# Patient Record
Sex: Female | Born: 1980
Health system: Southern US, Community
[De-identification: ages and names within clinical notes are randomized; demographics above are authoritative.]

## PROBLEM LIST (undated history)

## (undated) DIAGNOSIS — Z8742 Personal history of other diseases of the female genital tract: Secondary | ICD-10-CM

## (undated) DIAGNOSIS — K8020 Calculus of gallbladder without cholecystitis without obstruction: Secondary | ICD-10-CM

## (undated) DIAGNOSIS — F32A Depression, unspecified: Secondary | ICD-10-CM

## (undated) DIAGNOSIS — D649 Anemia, unspecified: Secondary | ICD-10-CM

## (undated) DIAGNOSIS — E559 Vitamin D deficiency, unspecified: Secondary | ICD-10-CM

## (undated) DIAGNOSIS — Z8619 Personal history of other infectious and parasitic diseases: Secondary | ICD-10-CM

## (undated) DIAGNOSIS — E039 Hypothyroidism, unspecified: Secondary | ICD-10-CM

## (undated) DIAGNOSIS — F329 Major depressive disorder, single episode, unspecified: Secondary | ICD-10-CM

## (undated) DIAGNOSIS — Z9889 Other specified postprocedural states: Secondary | ICD-10-CM

## (undated) DIAGNOSIS — L309 Dermatitis, unspecified: Secondary | ICD-10-CM

## (undated) DIAGNOSIS — K900 Celiac disease: Secondary | ICD-10-CM

## (undated) DIAGNOSIS — F419 Anxiety disorder, unspecified: Secondary | ICD-10-CM

## (undated) DIAGNOSIS — E079 Disorder of thyroid, unspecified: Secondary | ICD-10-CM

## (undated) DIAGNOSIS — K219 Gastro-esophageal reflux disease without esophagitis: Secondary | ICD-10-CM

## (undated) DIAGNOSIS — R112 Nausea with vomiting, unspecified: Secondary | ICD-10-CM

## (undated) HISTORY — PX: NO PAST SURGERIES: SHX2092

## (undated) HISTORY — DX: Celiac disease: K90.0

## (undated) HISTORY — DX: Disorder of thyroid, unspecified: E07.9

## (undated) HISTORY — DX: Dermatitis, unspecified: L30.9

## (undated) HISTORY — DX: Anxiety disorder, unspecified: F41.9

## (undated) HISTORY — PX: WISDOM TOOTH EXTRACTION: SHX21

## (undated) HISTORY — DX: Personal history of other infectious and parasitic diseases: Z86.19

## (undated) HISTORY — DX: Anemia, unspecified: D64.9

## (undated) HISTORY — DX: Depression, unspecified: F32.A

## (undated) HISTORY — DX: Personal history of other diseases of the female genital tract: Z87.42

## (undated) HISTORY — DX: Major depressive disorder, single episode, unspecified: F32.9

## (undated) HISTORY — DX: Calculus of gallbladder without cholecystitis without obstruction: K80.20

## (undated) HISTORY — DX: Vitamin D deficiency, unspecified: E55.9

---

## 2014-08-05 ENCOUNTER — Ambulatory Visit (INDEPENDENT_AMBULATORY_CARE_PROVIDER_SITE_OTHER): Payer: No Typology Code available for payment source | Admitting: Family

## 2014-08-05 ENCOUNTER — Ambulatory Visit (INDEPENDENT_AMBULATORY_CARE_PROVIDER_SITE_OTHER): Payer: No Typology Code available for payment source | Admitting: *Deleted

## 2014-08-05 ENCOUNTER — Telehealth: Payer: Self-pay | Admitting: Family

## 2014-08-05 ENCOUNTER — Encounter: Payer: Self-pay | Admitting: Family

## 2014-08-05 VITALS — BP 100/70 | HR 73 | Temp 98.5°F | Resp 16 | Ht 66.5 in | Wt 195.4 lb

## 2014-08-05 DIAGNOSIS — F329 Major depressive disorder, single episode, unspecified: Secondary | ICD-10-CM | POA: Insufficient documentation

## 2014-08-05 DIAGNOSIS — F32A Depression, unspecified: Secondary | ICD-10-CM

## 2014-08-05 DIAGNOSIS — F419 Anxiety disorder, unspecified: Secondary | ICD-10-CM

## 2014-08-05 DIAGNOSIS — L309 Dermatitis, unspecified: Secondary | ICD-10-CM

## 2014-08-05 DIAGNOSIS — F418 Other specified anxiety disorders: Secondary | ICD-10-CM

## 2014-08-05 DIAGNOSIS — E039 Hypothyroidism, unspecified: Secondary | ICD-10-CM | POA: Insufficient documentation

## 2014-08-05 DIAGNOSIS — Z23 Encounter for immunization: Secondary | ICD-10-CM

## 2014-08-05 DIAGNOSIS — E038 Other specified hypothyroidism: Secondary | ICD-10-CM | POA: Diagnosis not present

## 2014-08-05 DIAGNOSIS — H01139 Eczematous dermatitis of unspecified eye, unspecified eyelid: Secondary | ICD-10-CM

## 2014-08-05 LAB — TSH: TSH: 0.04 u[IU]/mL — AB (ref 0.35–4.50)

## 2014-08-05 MED ORDER — CITALOPRAM HYDROBROMIDE 40 MG PO TABS: 40.0000 mg | ORAL_TABLET | Freq: Every day | ORAL | Status: DC

## 2014-08-05 MED ORDER — LEVOTHYROXINE SODIUM 150 MCG PO TABS: 150.0000 ug | ORAL_TABLET | Freq: Every day | ORAL | Status: DC

## 2014-08-05 MED ORDER — CLOTRIMAZOLE-BETAMETHASONE 1-0.05 % EX CREA: 1.0000 "application " | TOPICAL_CREAM | Freq: Two times a day (BID) | CUTANEOUS | Status: DC

## 2014-08-05 NOTE — Progress Notes (Signed)
Pre visit review using our clinic review tool, if applicable. No additional management support is needed unless otherwise documented below in the visit note. 

## 2014-08-05 NOTE — Patient Instructions (Signed)
Please complete lab work prior to leaving. Schedule a fasting physical at your convenience.

## 2014-08-05 NOTE — Telephone Encounter (Signed)
Lab work is showing that synthroid dose is too high. I would like her to decrease to 150 mcg repeat tsh in 6 weeks, dx hypothyroid.

## 2014-08-05 NOTE — Assessment & Plan Note (Signed)
Has had some weight gain, obtain follow up TSH, continue synthroid.

## 2014-08-05 NOTE — Assessment & Plan Note (Signed)
Continue citalopram, stable.

## 2014-08-05 NOTE — Progress Notes (Signed)
Subjective:    Patient ID: Jessica Villa, female    DOB: Jan 25, 1981, 33 y.o.   MRN: [id number]  HPI  Jessica Villa is a 33 yr old female who presents today to establish care.  1) Hypothyroid- maintained on synthroid. Reports that she has been on synthroid since 2001. Reports that she feels fatigued and has noted some weight gain.  Has been 6 months since her last TSH. Reports 30 pound weight gain in the last 18 months.    2) Depression/anxiety- on citalopram. She has been on citalopram since 2012.  Has family hx of depression. Reports that this is well controlled.  Reports anxiety is stable.  3) Eczema- has seen dermatology and has used steroid creams with little improvement.She is requesting referral to allergist.  Has been treated since 2005.  Reports that her hands wrists/feet, scalp.     Review of Systems  Constitutional: Positive for unexpected weight change.  HENT: Negative for hearing loss and rhinorrhea.   Eyes: Negative for visual disturbance.  Respiratory: Negative for cough and shortness of breath.   Cardiovascular: Negative for chest pain and leg swelling.  Gastrointestinal: Negative for nausea, vomiting, diarrhea and blood in stool.  Genitourinary: Negative for dysuria and menstrual problem.  Musculoskeletal: Negative for myalgias and arthralgias.  Skin: Positive for rash.  Neurological: Negative for headaches.  Hematological: Negative for adenopathy.  Psychiatric/Behavioral:       See HPI   Past Medical History  Diagnosis Date  . Thyroid disease     hypothyroid  . Depression   . Eczema   . History of ovarian cyst   . Anemia     History   Social History  . Marital Status: Married    Spouse Name: N/A    Number of Children: N/A  . Years of Education: N/A   Occupational History  . Not on file.   Social History Main Topics  . Smoking status: Never Smoker   . Smokeless tobacco: Never Used  . Alcohol Use: Yes     Comment: 1 cocktail or wine per month  .  Drug Use: Not on file  . Sexual Activity: Not on file   Other Topics Concern  . Not on file   Social History Narrative   2 step children ( 804 year and 35 year old) lives with pt and her husband.    Stay at home mom   Has masters degree in education leadership   Has worked as a Dance movement psychotherapistteacher   Enjoys hiking   Crafts, crocheting, no pets    History reviewed. No pertinent past surgical history.  Family History  Problem Relation Age of Onset  . Depression Mother   . Dementia Father   . Diabetes Father   . Depression Father   . Hypothyroidism Father   . Alcohol abuse Maternal Grandmother   . Alcohol abuse Maternal Grandfather   . Hypothyroidism Paternal Grandmother   . Heart disease Paternal Grandfather   . Diabetes Paternal Grandfather   . Skin cancer Paternal Grandfather     No Known Allergies  No current outpatient prescriptions on file prior to visit.   No current facility-administered medications on file prior to visit.    BP 100/70 mmHg  Pulse 73  Temp(Src) 98.5 F (36.9 C) (Oral)  Resp 16  Ht 5' 6.5" (1.689 m)  Wt 195 lb 6.4 oz (88.633 kg)  BMI 31.07 kg/m2  SpO2 99%  LMP 08/01/2014       Objective:  Physical Exam  Constitutional: She is oriented to person, place, and time. She appears well-developed and well-nourished. No distress.  HENT:  Head: Normocephalic and atraumatic.  Right Ear: Tympanic membrane and ear canal normal.  Left Ear: Tympanic membrane and ear canal normal.  2+ bilateral tonsils, no erythema  Neck: No thyromegaly present.  Cardiovascular: Normal rate and regular rhythm.   No murmur heard. Pulmonary/Chest: Effort normal and breath sounds normal. No respiratory distress. She has no wheezes. She has no rales. She exhibits no tenderness.  Abdominal: Soft. Bowel sounds are normal.  Musculoskeletal: She exhibits no edema.  Lymphadenopathy:    She has no cervical adenopathy.  Neurological: She is alert and oriented to person, place, and  time.  Skin: Skin is warm and dry.  Dry cracked skin bilateral palmar surface, mild eczematous changes noted AC fossa  Psychiatric: She has a normal mood and affect. Her behavior is normal. Judgment and thought content normal.          Assessment & Plan:  Flu shot today

## 2014-08-05 NOTE — Assessment & Plan Note (Signed)
Will refer to allergy, I question possible fungal component- will give trial of lotrisone.

## 2014-08-06 NOTE — Telephone Encounter (Signed)
Left message with pt's husband to have pt return my call. 

## 2014-08-06 NOTE — Telephone Encounter (Signed)
Notified pt and she voices understanding. Lab appt scheduled for 09/17/14 and future lab order placed.

## 2014-08-07 ENCOUNTER — Encounter: Payer: Self-pay | Admitting: Family

## 2014-08-12 ENCOUNTER — Encounter: Payer: Self-pay | Admitting: Family

## 2014-08-12 ENCOUNTER — Telehealth: Payer: Self-pay | Admitting: *Deleted

## 2014-08-12 MED ORDER — LEVOTHYROXINE SODIUM 25 MCG PO TABS: 12.5000 ug | ORAL_TABLET | Freq: Every day | ORAL | Status: DC

## 2014-08-12 NOTE — Addendum Note (Signed)
Addended by: Sandford Craze'SULLIVAN, Kyann Heydt on: 08/12/2014 09:55 PM   Modules accepted: Orders

## 2014-08-12 NOTE — Telephone Encounter (Signed)
[facility] JG//CMA

## 2014-09-03 ENCOUNTER — Encounter: Payer: No Typology Code available for payment source | Admitting: Family

## 2014-09-11 ENCOUNTER — Encounter: Payer: Self-pay | Admitting: Family

## 2014-09-14 NOTE — Telephone Encounter (Signed)
Please contact pt to reschedule, do not charge no show fee.

## 2014-09-16 ENCOUNTER — Telehealth: Payer: Self-pay | Admitting: Family

## 2014-09-16 NOTE — Telephone Encounter (Signed)
   -----   Message from Darrol AngelMelanie Borunda to Sandford CrazeMelissa O'Sullivan, NP sent at 09/11/2014 3:55 PM -----     Hello. I am sorry I missed my other appointment. I did call the office to notify them. My father had a stroke and was in the hospital. It was an emergency cancellation. The secretary on the line said that she would report it to the billing office so I would not be charged the $65 cancellation or missed appointment fee. I would like to reschedule. Thanks for understanding.       Left message for patient to return my call to schedule this appointment.

## 2014-09-17 ENCOUNTER — Other Ambulatory Visit: Payer: No Typology Code available for payment source

## 2014-10-01 ENCOUNTER — Other Ambulatory Visit (INDEPENDENT_AMBULATORY_CARE_PROVIDER_SITE_OTHER): Payer: No Typology Code available for payment source

## 2014-10-01 ENCOUNTER — Telehealth: Payer: Self-pay | Admitting: Family

## 2014-10-01 DIAGNOSIS — E039 Hypothyroidism, unspecified: Secondary | ICD-10-CM

## 2014-10-01 LAB — TSH: TSH: 0.14 u[IU]/mL — AB (ref 0.35–4.50)

## 2014-10-01 NOTE — Telephone Encounter (Signed)
Please contact pt and let her know that thyroid dose is still too high.  Please verify how many mcg she is currently taking and I will adjust dose. Follow up in 6 weeks for TSH, dx hypothyroid.

## 2014-10-02 ENCOUNTER — Encounter: Payer: Self-pay | Admitting: Family

## 2014-10-02 DIAGNOSIS — Z0000 Encounter for general adult medical examination without abnormal findings: Secondary | ICD-10-CM

## 2014-10-02 MED ORDER — LEVOTHYROXINE SODIUM 137 MCG PO TABS: 137.0000 ug | ORAL_TABLET | Freq: Every day | ORAL | Status: DC

## 2014-10-02 NOTE — Telephone Encounter (Signed)
  Decrease synthroid to 137 mcg, repeat tsh in 6 weeks please.

## 2014-10-02 NOTE — Telephone Encounter (Signed)
Notified pt and she voices understanding. Lab order entered and appt scheduled for 11/13/14 at 9am.  Pt states she takes 150mcg once a day and 1/2 tablet of 25mcg once a day for a total of 162.5mcg a day.  Please advise.

## 2014-10-02 NOTE — Telephone Encounter (Signed)
Notified pt. 

## 2014-10-03 ENCOUNTER — Encounter: Payer: Self-pay | Admitting: Family

## 2014-10-22 ENCOUNTER — Ambulatory Visit (INDEPENDENT_AMBULATORY_CARE_PROVIDER_SITE_OTHER): Payer: No Typology Code available for payment source | Admitting: Family

## 2014-10-22 ENCOUNTER — Encounter: Payer: Self-pay | Admitting: Family

## 2014-10-22 VITALS — BP 118/80 | HR 99 | Temp 98.7°F | Resp 16 | Ht 66.5 in | Wt 201.2 lb

## 2014-10-22 DIAGNOSIS — E039 Hypothyroidism, unspecified: Secondary | ICD-10-CM

## 2014-10-22 DIAGNOSIS — F419 Anxiety disorder, unspecified: Principal | ICD-10-CM

## 2014-10-22 DIAGNOSIS — F418 Other specified anxiety disorders: Secondary | ICD-10-CM

## 2014-10-22 DIAGNOSIS — F329 Major depressive disorder, single episode, unspecified: Secondary | ICD-10-CM

## 2014-10-22 DIAGNOSIS — Z0000 Encounter for general adult medical examination without abnormal findings: Secondary | ICD-10-CM | POA: Insufficient documentation

## 2014-10-22 DIAGNOSIS — F32A Depression, unspecified: Secondary | ICD-10-CM

## 2014-10-22 LAB — URINALYSIS, ROUTINE W REFLEX MICROSCOPIC
Bilirubin Urine: NEGATIVE
Hgb urine dipstick: NEGATIVE
KETONES UR: NEGATIVE
LEUKOCYTES UA: NEGATIVE
NITRITE: NEGATIVE
PH: 6 (ref 5.0–8.0)
Specific Gravity, Urine: <=1.005 — AB (ref 1.000–1.030)
Total Protein, Urine: NEGATIVE
Urine Glucose: NEGATIVE
Urobilinogen, UA: 0.2 (ref 0.0–1.0)

## 2014-10-22 LAB — HEPATIC FUNCTION PANEL
ALK PHOS: 62 U/L (ref 39–117)
ALT: 30 U/L (ref 0–35)
AST: 58 U/L — ABNORMAL HIGH (ref 0–37)
Albumin: 3.9 g/dL (ref 3.5–5.2)
BILIRUBIN TOTAL: 0.3 mg/dL (ref 0.2–1.2)
Bilirubin, Direct: 0.1 mg/dL (ref 0.0–0.3)
Total Protein: 6.8 g/dL (ref 6.0–8.3)

## 2014-10-22 LAB — BASIC METABOLIC PANEL
BUN: 13 mg/dL (ref 6–23)
CALCIUM: 9.2 mg/dL (ref 8.4–10.5)
CO2: 28 mEq/L (ref 19–32)
CREATININE: 0.74 mg/dL (ref 0.40–1.20)
Chloride: 104 mEq/L (ref 96–112)
GFR: 94.4 mL/min (ref >60.00)
Glucose, Bld: 91 mg/dL (ref 70–99)
Potassium: 3.6 mEq/L (ref 3.5–5.1)
Sodium: 136 mEq/L (ref 135–145)

## 2014-10-22 LAB — CBC WITH DIFFERENTIAL/PLATELET
Basophils Absolute: 0 10*3/uL (ref 0.0–0.1)
Basophils Relative: 0.4 % (ref 0.0–3.0)
Eosinophils Absolute: 0.2 10*3/uL (ref 0.0–0.7)
Eosinophils Relative: 3.1 % (ref 0.0–5.0)
HEMATOCRIT: 38.5 % (ref 36.0–46.0)
Hemoglobin: 12.9 g/dL (ref 12.0–15.0)
LYMPHS PCT: 39.5 % (ref 12.0–46.0)
Lymphs Abs: 2.7 10*3/uL (ref 0.7–4.0)
MCHC: 33.6 g/dL (ref 30.0–36.0)
MCV: 82.6 fl (ref 78.0–100.0)
MONOS PCT: 10.2 % (ref 3.0–12.0)
Monocytes Absolute: 0.7 10*3/uL (ref 0.1–1.0)
Neutro Abs: 3.2 10*3/uL (ref 1.4–7.7)
Neutrophils Relative %: 46.8 % (ref 43.0–77.0)
Platelets: 237 10*3/uL (ref 150.0–400.0)
RBC: 4.66 Mil/uL (ref 3.87–5.11)
RDW: 14.4 % (ref 11.5–15.5)
WBC: 6.8 10*3/uL (ref 4.0–10.5)

## 2014-10-22 LAB — LIPID PANEL
CHOLESTEROL: 179 mg/dL (ref 0–200)
HDL: 49 mg/dL (ref >39.00)
LDL CALC: 115 mg/dL — AB (ref 0–99)
NonHDL: 130
TRIGLYCERIDES: 74 mg/dL (ref 0.0–149.0)
Total CHOL/HDL Ratio: 4
VLDL: 14.8 mg/dL (ref 0.0–40.0)

## 2014-10-22 NOTE — Progress Notes (Signed)
Subjective:    Patient ID: Jessica Villa, female    DOB: [birth year], 34 y.o.   MRN: [id number]  HPI Ms. Zern presents today for a  complete physical.  Immunizations: influenza-07/2014; Td-03/2009 Diet: eats plenty of fruits and vegetables. Rarely eats fast food. Exercise: running and arm weights weekly but plans to increase. Pap Smear: having this afternoon at gyn. Mammogram: will discuss with gyn.  Hypothyroidism: Reports compliance with levothyroxine. She denies fatigue, cold intolerance, constipation, weight gain or inability to lose weight.TSH was checked in November due to weight gain reported by pt. and it was  0.04. Synthroid decreased to 137 mcg 10/02/2014 in response to TSH 0.14 on 10/01/2014. Lab Results  Component Value Date   TSH 0.14* 10/01/2014  Reports that she has feels better at higher dose of levothyroxine. When she takes less she feels tearful and fatigued. Reports diet is good and she doesn't understand why weight is up.  1. Anxiety/depression: she is compliant with citalopram 40 mg. Reports she increased to 40 mg last year when she was tearful about decreasing synthroid. Feels anxiety/depression stable.  2. Reports 3-4 headaches last few weeks on right side of head or above eyes pain 5/10. Some were around her period , some weren't. Last a few hours. Tylenol/ibuprofen not helping. Reports no nausea but reports sensitivity to light.    Review of Systems  Constitutional: Positive for fatigue and unexpected weight change (Has gained 20 lbs.). Negative for fever and chills.  HENT: Negative for congestion.   Eyes: Negative for visual disturbance.  Respiratory: Negative for cough, shortness of breath and wheezing.   Cardiovascular: Negative for chest pain, palpitations and leg swelling.  Gastrointestinal: Negative for diarrhea, constipation and abdominal distention.  Genitourinary: Negative for difficulty urinating, menstrual problem and dyspareunia.       Stopped BC  last month in prep for having children. Began prenatal vitamins.  Musculoskeletal: Negative for myalgias.  Skin: Positive for rash.       Eczema on hands.  Neurological: Negative for syncope.  Hematological: Negative for adenopathy.  Psychiatric/Behavioral: Negative for sleep disturbance and decreased concentration.       Reports she sleeps too much.       Past Medical History  Diagnosis Date  . Thyroid disease     hypothyroid  . Depression   . Eczema   . History of ovarian cyst   . Anemia     History   Social History  . Marital Status: Married    Spouse Name: N/A    Number of Children: N/A  . Years of Education: N/A   Occupational History  . Not on file.   Social History Main Topics  . Smoking status: Never Smoker   . Smokeless tobacco: Never Used  . Alcohol Use: Yes     Comment: 1 cocktail or wine per month  . Drug Use: Not on file  . Sexual Activity: Not on file   Other Topics Concern  . Not on file   Social History Narrative   2 step children ( 64 year and 36 year old) lives with pt and her husband.    Stay at home mom   Has masters degree in education leadership   Has worked as a Dance movement psychotherapistteacher   Enjoys hiking   Crafts, crocheting, no pets    History reviewed. No pertinent past surgical history.  Family History  Problem Relation Age of Onset  . Depression Mother   . Dementia Father   .  Diabetes Father   . Depression Father   . Hypothyroidism Father   . Alcohol abuse Maternal Grandmother   . Alcohol abuse Maternal Grandfather   . Hypothyroidism Paternal Grandmother   . Heart disease Paternal Grandfather   . Diabetes Paternal Grandfather   . Skin cancer Paternal Grandfather     No Known Allergies  Current Outpatient Prescriptions on File Prior to Visit  Medication Sig Dispense Refill  . citalopram (CELEXA) 40 MG tablet Take 1 tablet (40 mg total) by mouth daily. 90 tablet 1  . clotrimazole-betamethasone (LOTRISONE) cream Apply 1 application  topically 2 (two) times daily. 45 g 0  . levothyroxine (SYNTHROID) 137 MCG tablet Take 1 tablet (137 mcg total) by mouth daily before breakfast. 30 tablet 2  . clobetasol cream (TEMOVATE) 0.05 % Apply 1 application topically 2 (two) times daily as needed.    . drospirenone-ethinyl estradiol (OCELLA) 3-0.03 MG tablet Take 1 tablet by mouth daily.     No current facility-administered medications on file prior to visit.    BP 118/80 mmHg  Pulse 99  Temp(Src) 98.7 F (37.1 C) (Oral)  Resp 16  Ht 5' 6.5" (1.689 m)  Wt 201 lb 4 oz (91.286 kg)  BMI 32.00 kg/m2  SpO2 99%  LMP 10/15/2014    Objective:   Physical Exam  Constitutional: She is oriented to person, place, and time. She appears well-developed and well-nourished. No distress.  HENT:  Right Ear: Tympanic membrane normal.  Left Ear: Tympanic membrane normal.  Mouth/Throat: No oropharyngeal exudate.  Eyes: Conjunctivae and EOM are normal. Pupils are equal, round, and reactive to light. No scleral icterus.  Neck: Neck supple. No thyromegaly present.  Cardiovascular: Normal rate, regular rhythm and normal heart sounds.  Exam reveals no gallop and no friction rub.   No murmur heard. Pulmonary/Chest: Effort normal and breath sounds normal. No respiratory distress. She has no wheezes. She has no rales.  Abdominal: Soft. Bowel sounds are normal. She exhibits no distension and no mass. There is no tenderness. There is no rebound and no guarding.  Musculoskeletal: She exhibits no edema.  Lymphadenopathy:    She has no cervical adenopathy.  Neurological: She is alert and oriented to person, place, and time. She has normal strength. She exhibits normal muscle tone.  Reflex Scores:      Patellar reflexes are 2+ on the right side and 2+ on the left side. Strength 5/5 all extremities.  Skin: Skin is warm and dry. She is not diaphoretic.  Psychiatric: She has a normal mood and affect. Her behavior is normal. Judgment and thought content  normal.          Assessment & Plan:  Patient seen along with [facility] NP-student.  I have personally seen and examined patient and agree with Ms. Whitmire's assessment and plan- Sandford Craze NP

## 2014-10-22 NOTE — Assessment & Plan Note (Signed)
Continue healthy diet and increase exercise. Immunizations up to date. Will check UA, CBC/diff, LFTs, lipid profile, BMET. Has appointment for Pap today.

## 2014-10-22 NOTE — Assessment & Plan Note (Signed)
Synthroid decreased to 137 mcg 10/02/2014 in response to TSH 0.14 on 10/01/2014. Reports fatigue and weight gain which does not go along with low TSH. Will recheck TSH in 4 weeks.

## 2014-10-22 NOTE — Assessment & Plan Note (Signed)
Stable on citalopram 40 mg. Continue.

## 2014-10-22 NOTE — Progress Notes (Signed)
Pre visit review using our clinic review tool, if applicable. No additional management support is needed unless otherwise documented below in the visit note. 

## 2014-10-22 NOTE — Patient Instructions (Signed)
Please complete lab work prior to leaving.  Follow up in 6 months.  

## 2014-10-25 ENCOUNTER — Encounter: Payer: Self-pay | Admitting: Family

## 2014-10-28 ENCOUNTER — Encounter: Payer: Self-pay | Admitting: Family

## 2014-10-28 ENCOUNTER — Telehealth: Payer: Self-pay | Admitting: Family

## 2014-10-28 DIAGNOSIS — R945 Abnormal results of liver function studies: Secondary | ICD-10-CM

## 2014-10-28 DIAGNOSIS — R7989 Other specified abnormal findings of blood chemistry: Secondary | ICD-10-CM

## 2014-10-28 NOTE — Telephone Encounter (Signed)
Please contact pt and remind her to complete lab work ordered at her visit.

## 2014-10-28 NOTE — Telephone Encounter (Signed)
Kidney function, blood count, urine all look good. One of her LFT's is elevated.  I would like her to repeat in 1 month along with hepatitis screen. Most often cause we see if fatty liver. If still elevated in 1 month, will obtain abdominal US to further evaluate.

## 2014-10-28 NOTE — Telephone Encounter (Signed)
Results from 10/22/14 are available in EPIC.  Please advise?

## 2014-10-28 NOTE — Telephone Encounter (Signed)
Left detailed message on cell# and to call and schedule lab appt. Future lab order entered.

## 2014-10-29 ENCOUNTER — Telehealth: Payer: Self-pay | Admitting: Family

## 2014-10-29 NOTE — Telephone Encounter (Signed)
Please contact pt and let her know that one of her liver function tests are mildly elevated.  Repeat lft in in 1 month. Dx elevated LFT. Other labs look good.

## 2014-10-29 NOTE — Telephone Encounter (Signed)
Pt aware and has scheduled lab appt for 11/20/14. See 10/28/14 phone note.

## 2014-11-13 ENCOUNTER — Other Ambulatory Visit: Payer: No Typology Code available for payment source

## 2014-11-20 ENCOUNTER — Other Ambulatory Visit: Payer: No Typology Code available for payment source

## 2014-11-21 ENCOUNTER — Other Ambulatory Visit (INDEPENDENT_AMBULATORY_CARE_PROVIDER_SITE_OTHER): Payer: No Typology Code available for payment source

## 2014-11-21 DIAGNOSIS — E039 Hypothyroidism, unspecified: Secondary | ICD-10-CM

## 2014-11-21 DIAGNOSIS — R7989 Other specified abnormal findings of blood chemistry: Secondary | ICD-10-CM

## 2014-11-21 DIAGNOSIS — R945 Abnormal results of liver function studies: Secondary | ICD-10-CM

## 2014-11-21 LAB — HEPATIC FUNCTION PANEL
ALBUMIN: 3.9 g/dL (ref 3.5–5.2)
ALT: 23 U/L (ref 0–35)
AST: 28 U/L (ref 0–37)
Alkaline Phosphatase: 59 U/L (ref 39–117)
Bilirubin, Direct: 0.1 mg/dL (ref 0.0–0.3)
Total Bilirubin: 0.5 mg/dL (ref 0.2–1.2)
Total Protein: 7 g/dL (ref 6.0–8.3)

## 2014-11-21 LAB — HEPATITIS PANEL, ACUTE
HCV AB: NEGATIVE
Hep A IgM: NONREACTIVE
Hep B C IgM: NONREACTIVE
Hepatitis B Surface Ag: NEGATIVE

## 2014-11-21 LAB — TSH: TSH: 0.43 u[IU]/mL (ref 0.35–4.50)

## 2014-11-22 ENCOUNTER — Encounter: Payer: Self-pay | Admitting: Family

## 2014-12-31 ENCOUNTER — Other Ambulatory Visit: Payer: Self-pay | Admitting: Family

## 2015-01-02 ENCOUNTER — Other Ambulatory Visit: Payer: Self-pay | Admitting: Family

## 2015-01-30 ENCOUNTER — Encounter: Payer: Self-pay | Admitting: Family

## 2015-02-05 ENCOUNTER — Telehealth: Payer: Self-pay | Admitting: Family

## 2015-02-05 ENCOUNTER — Ambulatory Visit (INDEPENDENT_AMBULATORY_CARE_PROVIDER_SITE_OTHER): Payer: No Typology Code available for payment source | Admitting: Family

## 2015-02-05 ENCOUNTER — Encounter: Payer: Self-pay | Admitting: Family

## 2015-02-05 VITALS — BP 104/60 | HR 65 | Temp 98.0°F | Resp 18 | Ht 66.5 in | Wt 200.2 lb

## 2015-02-05 DIAGNOSIS — L309 Dermatitis, unspecified: Secondary | ICD-10-CM | POA: Diagnosis not present

## 2015-02-05 DIAGNOSIS — F419 Anxiety disorder, unspecified: Principal | ICD-10-CM

## 2015-02-05 DIAGNOSIS — F418 Other specified anxiety disorders: Secondary | ICD-10-CM

## 2015-02-05 DIAGNOSIS — F32A Depression, unspecified: Secondary | ICD-10-CM

## 2015-02-05 DIAGNOSIS — F329 Major depressive disorder, single episode, unspecified: Secondary | ICD-10-CM

## 2015-02-05 MED ORDER — CITALOPRAM HYDROBROMIDE 10 MG PO TABS: ORAL_TABLET | ORAL | Status: DC

## 2015-02-05 NOTE — Progress Notes (Signed)
   Subjective:    Patient ID: Jessica Villa, female    DOB: [birth year], 34 y.o.   MRN: [id number]  HPI   Ms. Jessica Villa is a 34 yr old female who presents today to discuss anxiety/depression and meds. She is currently [redacted] weeks pregnant and is on citalopram 20mg .  Reports that she has had a few tearful spells since she came down on the dose. Wants to know if she should continue citalopram now that she is pregnant.   Review of Systems See HPI  Past Medical History  Diagnosis Date  . Thyroid disease     hypothyroid  . Depression   . Eczema   . History of ovarian cyst   . Anemia     History   Social History  . Marital Status: Married    Spouse Name: N/A  . Number of Children: N/A  . Years of Education: N/A   Occupational History  . Not on file.   Social History Main Topics  . Smoking status: Never Smoker   . Smokeless tobacco: Never Used  . Alcohol Use: Yes     Comment: 1 cocktail or wine per month  . Drug Use: Not on file  . Sexual Activity: Not on file   Other Topics Concern  . Not on file   Social History Narrative   2 step children ( 254 year and 36 year old) lives with pt and her husband.    Stay at home mom   Has masters degree in education leadership   Has worked as a Dance movement psychotherapistteacher   Enjoys hiking   Crafts, crocheting, no pets    No past surgical history on file.  Family History  Problem Relation Age of Onset  . Depression Mother   . Dementia Father   . Diabetes Father   . Depression Father   . Hypothyroidism Father   . Alcohol abuse Maternal Grandmother   . Alcohol abuse Maternal Grandfather   . Hypothyroidism Paternal Grandmother   . Heart disease Paternal Grandfather   . Diabetes Paternal Grandfather   . Skin cancer Paternal Grandfather     No Known Allergies  Current Outpatient Prescriptions on File Prior to Visit  Medication Sig Dispense Refill  . clotrimazole-betamethasone (LOTRISONE) cream APPLY ONE APPLICATION TOPICALLY 2 TIMES DAILY 45 g 0  .  levothyroxine (SYNTHROID, LEVOTHROID) 137 MCG tablet TAKE ONE TABLET BY MOUTH ONE TIME DAILY BEFORE BREAKFAST 30 tablet 2  . Prenatal Vit-Fe Fumarate-FA (PRENATAL MULTIVITAMIN) TABS tablet Take 1 tablet by mouth daily at 12 noon.     No current facility-administered medications on file prior to visit.    BP 104/60 mmHg  Pulse 65  Temp(Src) 98 F (36.7 C) (Oral)  Resp 18  Ht 5' 6.5" (1.689 m)  Wt 200 lb 3.2 oz (90.81 kg)  BMI 31.83 kg/m2  SpO2 96%  LMP 10/15/2014       Objective:   Physical Exam  Constitutional: She is oriented to person, place, and time. She appears well-developed and well-nourished.  HENT:  Head: Normocephalic and atraumatic.  Pulmonary/Chest: Effort normal and breath sounds normal.  Neurological: She is alert and oriented to person, place, and time.  Psychiatric: She has a normal mood and affect. Her behavior is normal. Judgment and thought content normal.          Assessment & Plan:  15 min spent with pt.  >50% of this time was spent counseling pt on depression/pregnacy risks on SSRI's.

## 2015-02-05 NOTE — Patient Instructions (Addendum)
Decrease citalopram to 10mg  once daily for 1-2 weeks, then 1/2 tab by mouth for 1 week then stop.   Follow up in 6 weeks.

## 2015-02-05 NOTE — Progress Notes (Signed)
Pre visit review using our clinic review tool, if applicable. No additional management support is needed unless otherwise documented below in the visit note. 

## 2015-02-05 NOTE — Assessment & Plan Note (Signed)
Advised d/c lotrisone due to this med being category C.  Aquaphor prn.

## 2015-02-05 NOTE — Telephone Encounter (Signed)
Care Team has been updated.

## 2015-02-05 NOTE — Telephone Encounter (Signed)
Caller name: [name] Relation to pt: self  Call back number:[phone] Pharmacy:  Reason for call: Pt came in office today and saw Sandford CrazeMelissa O'Sullivan, pt states forgot to mention that has changed Her OB MD to Dr. Marcine MatarJames Thomblin and is wanting to have that on her record. Please advise.

## 2015-02-05 NOTE — Assessment & Plan Note (Signed)
We discussed risk/beneft of continuing citalopram during pregnancy in detail. She wishes to try to wean off. Advised wean as outlined in AVS. Pt is to call if depression symptoms worsen with wean and to go to the ED if SI/HI.

## 2015-02-16 ENCOUNTER — Inpatient Hospital Stay (HOSPITAL_COMMUNITY)
Admission: AD | Admit: 2015-02-16 | Discharge: 2015-02-16 | Disposition: A | Discharge status: Home / Self Care | Payer: No Typology Code available for payment source | Source: Ambulatory Visit | Attending: Obstetrics and Gynecology | Admitting: Obstetrics and Gynecology

## 2015-02-16 ENCOUNTER — Encounter (HOSPITAL_COMMUNITY): Payer: Self-pay

## 2015-02-16 DIAGNOSIS — Z6741 Type O blood, Rh negative: Secondary | ICD-10-CM | POA: Diagnosis not present

## 2015-02-16 DIAGNOSIS — Z833 Family history of diabetes mellitus: Secondary | ICD-10-CM | POA: Diagnosis not present

## 2015-02-16 DIAGNOSIS — E039 Hypothyroidism, unspecified: Secondary | ICD-10-CM | POA: Diagnosis not present

## 2015-02-16 DIAGNOSIS — Z79899 Other long term (current) drug therapy: Secondary | ICD-10-CM | POA: Diagnosis not present

## 2015-02-16 DIAGNOSIS — J029 Acute pharyngitis, unspecified: Secondary | ICD-10-CM | POA: Diagnosis not present

## 2015-02-16 DIAGNOSIS — O039 Complete or unspecified spontaneous abortion without complication: Secondary | ICD-10-CM | POA: Diagnosis present

## 2015-02-16 HISTORY — DX: Hypothyroidism, unspecified: E03.9

## 2015-02-16 MED ORDER — RHO D IMMUNE GLOBULIN 1500 UNIT/2ML IJ SOSY
300.0000 ug | PREFILLED_SYRINGE | Freq: Once | INTRAMUSCULAR | Status: AC
  Administered 2015-02-16: 300 ug via INTRAMUSCULAR
  Filled 2015-02-16: qty 2

## 2015-02-16 MED ORDER — AMOXICILLIN 500 MG PO CAPS: 500.0000 mg | ORAL_CAPSULE | Freq: Two times a day (BID) | ORAL | Status: DC

## 2015-02-16 NOTE — MAU Note (Signed)
Pt states had miscarriage, was given cytotec on Friday. Here for rhophylac injection r/t O negative blood type. Also may have strep throat. Unsure if throat is sore from pt being upset and crying, or if she may have strep.

## 2015-02-16 NOTE — MAU Provider Note (Signed)
History     CSN: [id number]  Arrival date and time: 02/16/15 1806   First Provider Initiated Contact with Patient 02/16/15 1917      Chief Complaint  Patient presents with  . Possible strep throat   . Miscarriage   HPI Jessica Villa 34 y.o. G1P0 presents to MAU for Rhogam following a miscarriage and eval for Strep throat.  She was noted to have miscarriage at approx 7 weeks in the office on 5/27.  She was given intravaginal cytotec in office and proceeded to pass blood and products through the evening.  Since then she is having scant vaginal bleeding and no abdominal pain.  She was called and informed she would need to have rhogam due to her negative blood type and she came here for that.    She reports a sore throat started yesterday.  Salt water gargles and pain medication have not been helpful.  She has not been able to rest well as a result of the sore throat.   She denies fever, weakness, headache, abdominal pain, chest pain, shortness of breath, nasal congestion, cough.   OB History    Gravida Para Term Preterm AB TAB SAB Ectopic Multiple Living   1               Past Medical History  Diagnosis Date  . Thyroid disease     hypothyroid  . Depression   . Eczema   . History of ovarian cyst   . Anemia   . Hypothyroidism     Past Surgical History  Procedure Laterality Date  . No past surgeries      Family History  Problem Relation Age of Onset  . Depression Mother   . Dementia Father   . Diabetes Father   . Depression Father   . Hypothyroidism Father   . Alcohol abuse Maternal Grandmother   . Alcohol abuse Maternal Grandfather   . Hypothyroidism Paternal Grandmother   . Heart disease Paternal Grandfather   . Diabetes Paternal Grandfather   . Skin cancer Paternal Grandfather     History  Substance Use Topics  . Smoking status: Never Smoker   . Smokeless tobacco: Never Used  . Alcohol Use: Yes     Comment: 1 cocktail or wine per month none since pregnancy     Allergies: No Known Allergies  Prescriptions prior to admission  Medication Sig Dispense Refill Last Dose  . citalopram (CELEXA) 40 MG tablet Take 40 mg by mouth daily.   02/15/2015 at Unknown time  . clotrimazole-betamethasone (LOTRISONE) cream APPLY ONE APPLICATION TOPICALLY 2 TIMES DAILY 45 g 0 Past Week at Unknown time  . ibuprofen (ADVIL,MOTRIN) 200 MG tablet Take 200 mg by mouth every 6 (six) hours as needed for moderate pain.   02/16/2015 at Unknown time  . levothyroxine (SYNTHROID, LEVOTHROID) 137 MCG tablet TAKE ONE TABLET BY MOUTH ONE TIME DAILY BEFORE BREAKFAST 30 tablet 2 02/16/2015 at Unknown time  . oxyCODONE-acetaminophen (PERCOCET/ROXICET) 5-325 MG per tablet Take 1 tablet by mouth every 4 (four) hours as needed for moderate pain or severe pain.   02/15/2015 at Unknown time  . Prenatal Vit-Fe Fumarate-FA (PRENATAL MULTIVITAMIN) TABS tablet Take 1 tablet by mouth daily at 12 noon.   Past Month at Unknown time  . citalopram (CELEXA) 10 MG tablet Take as directed. (Patient not taking: Reported on 02/16/2015) 30 tablet 0 Not Taking at Unknown time    ROS Physical Exam   Blood pressure 126/63, pulse 85, temperature  98 F (36.7 C), temperature source Oral, resp. rate 18, height 5' 6.5" (1.689 m), weight 200 lb 4 oz (90.833 kg), last menstrual period 10/15/2014, unknown if currently breastfeeding.  Physical Exam  Constitutional: She is oriented to person, place, and time. She appears well-developed and well-nourished. No distress.  HENT:  Head: Normocephalic and atraumatic.  Oropharynx with erythema, enlarged tonsils bilaterally.  Tiny amt of exudate noted on right tonsil.   Eyes: EOM are normal.  Neck: Normal range of motion.  No cervical lymphadenopathy.  Cardiovascular: Normal rate, regular rhythm and normal heart sounds.   Respiratory: Effort normal and breath sounds normal. No respiratory distress.  GI: Soft. Bowel sounds are normal. She exhibits no distension.   Musculoskeletal: Normal range of motion.  Neurological: She is alert and oriented to person, place, and time.  Skin: Skin is warm and dry.  Psychiatric: She has a normal mood and affect.    MAU Course  Procedures  MDM Discussed with Dr. Marcelle Overlie.  He had given nursing orders for Rhogam.   He is agreeable to treat pharyngitis with 2 days of abx and then pt to call office to confirm results of strep testing.    Report given and care turned over to Thressa Sheller, CNM.   Assessment and Plan  A:   P:    Bertram Denver 02/16/2015, 7:49 PM

## 2015-02-16 NOTE — MAU Provider Note (Signed)
History     CSN: [id number]  Arrival date and time: 02/16/15 1806   First Provider Initiated Contact with Patient 02/16/15 1917      Chief Complaint  Patient presents with  . Possible strep throat   . Miscarriage   HPI Jessica Villa 34 y.o. G1P0 presents to MAU for Rhogam following a miscarriage and eval for Strep throat.  She was noted to have miscarriage at approx 7 weeks in the office on 5/27.  She was given intravaginal cytotec in office and proceeded to pass blood and products through the evening.  Since then she is having scant vaginal bleeding and no abdominal pain.  She was called and informed she would need to have rhogam due to her negative blood type and she came here for that.    She reports a sore throat started yesterday.  Salt water gargles and pain medication have not been helpful.  She has not been able to rest well as a result of the sore throat.   She denies fever, weakness, headache, abdominal pain, chest pain, shortness of breath, nasal congestion, cough.   OB History    Gravida Para Term Preterm AB TAB SAB Ectopic Multiple Living   1               Past Medical History  Diagnosis Date  . Thyroid disease     hypothyroid  . Depression   . Eczema   . History of ovarian cyst   . Anemia   . Hypothyroidism     Past Surgical History  Procedure Laterality Date  . No past surgeries      Family History  Problem Relation Age of Onset  . Depression Mother   . Dementia Father   . Diabetes Father   . Depression Father   . Hypothyroidism Father   . Alcohol abuse Maternal Grandmother   . Alcohol abuse Maternal Grandfather   . Hypothyroidism Paternal Grandmother   . Heart disease Paternal Grandfather   . Diabetes Paternal Grandfather   . Skin cancer Paternal Grandfather     History  Substance Use Topics  . Smoking status: Never Smoker   . Smokeless tobacco: Never Used  . Alcohol Use: Yes     Comment: 1 cocktail or wine per month none since pregnancy     Allergies: No Known Allergies  Prescriptions prior to admission  Medication Sig Dispense Refill Last Dose  . citalopram (CELEXA) 40 MG tablet Take 40 mg by mouth daily.   02/15/2015 at Unknown time  . clotrimazole-betamethasone (LOTRISONE) cream APPLY ONE APPLICATION TOPICALLY 2 TIMES DAILY 45 g 0 Past Week at Unknown time  . ibuprofen (ADVIL,MOTRIN) 200 MG tablet Take 200 mg by mouth every 6 (six) hours as needed for moderate pain.   02/16/2015 at Unknown time  . levothyroxine (SYNTHROID, LEVOTHROID) 137 MCG tablet TAKE ONE TABLET BY MOUTH ONE TIME DAILY BEFORE BREAKFAST 30 tablet 2 02/16/2015 at Unknown time  . oxyCODONE-acetaminophen (PERCOCET/ROXICET) 5-325 MG per tablet Take 1 tablet by mouth every 4 (four) hours as needed for moderate pain or severe pain.   02/15/2015 at Unknown time  . Prenatal Vit-Fe Fumarate-FA (PRENATAL MULTIVITAMIN) TABS tablet Take 1 tablet by mouth daily at 12 noon.   Past Month at Unknown time  . citalopram (CELEXA) 10 MG tablet Take as directed. (Patient not taking: Reported on 02/16/2015) 30 tablet 0 Not Taking at Unknown time    ROS Physical Exam   Blood pressure 126/63, pulse 85, temperature  98 F (36.7 C), temperature source Oral, resp. rate 18, height 5' 6.5" (1.689 m), weight 90.833 kg (200 lb 4 oz), last menstrual period 10/15/2014, unknown if currently breastfeeding.  Physical Exam  Constitutional: She is oriented to person, place, and time. She appears well-developed and well-nourished. No distress.  HENT:  Head: Normocephalic and atraumatic.  Oropharynx with erythema, enlarged tonsils bilaterally.  Tiny amt of exudate noted on right tonsil.   Eyes: EOM are normal.  Neck: Normal range of motion.  No cervical lymphadenopathy.  Cardiovascular: Normal rate, regular rhythm and normal heart sounds.   Respiratory: Effort normal and breath sounds normal. No respiratory distress.  GI: Soft. Bowel sounds are normal. She exhibits no distension.   Musculoskeletal: Normal range of motion.  Neurological: She is alert and oriented to person, place, and time.  Skin: Skin is warm and dry.  Psychiatric: She has a normal mood and affect.    MAU Course  Procedures  MDM Discussed with Dr. Marcelle OverlieHolland.  He had given nursing orders for Rhogam.   He is agreeable to treat pharyngitis with 2 days of abx and then pt to call office to confirm results of strep testing.    Report given and care turned over to Thressa ShellerHeather Shakti Villa, CNM.   Assessment and Plan   1. SAB (spontaneous abortion)   2. Sore throat    DC home Comfort measures reviewed  Bleeding precautions RX: Amoxicillin 500 mg BID x 2 days #4, 0RF Strep test pending  Return to MAU as needed FU with OB as planned  Follow-up Information    Follow up with Meriel PicaHOLLAND,RICHARD M, MD.   Specialty:  Obstetrics and Gynecology   Why:  As scheduled   Contact information:   [address] [place] [id number] [phone]         Tawnya CrookHogan, Jessica Villa 02/16/2015, 8:23 PM

## 2015-02-16 NOTE — Discharge Instructions (Signed)
Miscarriage A miscarriage is the sudden loss of an unborn baby (fetus) before the 20th week of pregnancy. Most miscarriages happen in the first 3 months of pregnancy. Sometimes, it happens before a woman even knows she is pregnant. A miscarriage is also called a "spontaneous miscarriage" or "early pregnancy loss." Having a miscarriage can be an emotional experience. Talk with your caregiver about any questions you may have about miscarrying, the grieving process, and your future pregnancy plans. CAUSES   Problems with the fetal chromosomes that make it impossible for the baby to develop normally. Problems with the baby's genes or chromosomes are most often the result of errors that occur, by chance, as the embryo divides and grows. The problems are not inherited from the parents.  Infection of the cervix or uterus.   Hormone problems.   Problems with the cervix, such as having an incompetent cervix. This is when the tissue in the cervix is not strong enough to hold the pregnancy.   Problems with the uterus, such as an abnormally shaped uterus, uterine fibroids, or congenital abnormalities.   Certain medical conditions.   Smoking, drinking alcohol, or taking illegal drugs.   Trauma.  Often, the cause of a miscarriage is unknown.  SYMPTOMS   Vaginal bleeding or spotting, with or without cramps or pain.  Pain or cramping in the abdomen or lower back.  Passing fluid, tissue, or blood clots from the vagina. DIAGNOSIS  Your caregiver will perform a physical exam. You may also have an ultrasound to confirm the miscarriage. Blood or urine tests may also be ordered. TREATMENT   Sometimes, treatment is not necessary if you naturally pass all the fetal tissue that was in the uterus. If some of the fetus or placenta remains in the body (incomplete miscarriage), tissue left behind may become infected and must be removed. Usually, a dilation and curettage (D and C) procedure is performed.  During a D and C procedure, the cervix is widened (dilated) and any remaining fetal or placental tissue is gently removed from the uterus.  Antibiotic medicines are prescribed if there is an infection. Other medicines may be given to reduce the size of the uterus (contract) if there is a lot of bleeding.  If you have Rh negative blood and your baby was Rh positive, you will need a Rh immunoglobulin shot. This shot will protect any future baby from having Rh blood problems in future pregnancies. HOME CARE INSTRUCTIONS   Your caregiver may order bed rest or may allow you to continue light activity. Resume activity as directed by your caregiver.  Have someone help with home and family responsibilities during this time.   Keep track of the number of sanitary pads you use each day and how soaked (saturated) they are. Write down this information.   Do not use tampons. Do not douche or have sexual intercourse until approved by your caregiver.   Only take over-the-counter or prescription medicines for pain or discomfort as directed by your caregiver.   Do not take aspirin. Aspirin can cause bleeding.   Keep all follow-up appointments with your caregiver.   If you or your partner have problems with grieving, talk to your caregiver or seek counseling to help cope with the pregnancy loss. Allow enough time to grieve before trying to get pregnant again.  SEEK IMMEDIATE MEDICAL CARE IF:   You have severe cramps or pain in your back or abdomen.  You have a fever.  You pass large blood clots (walnut-sized   or larger) ortissue from your vagina. Save any tissue for your caregiver to inspect.   Your bleeding increases.   You have a thick, bad-smelling vaginal discharge.  You become lightheaded, weak, or you faint.   You have chills.  MAKE SURE YOU:  Understand these instructions.  Will watch your condition.  Will get help right away if you are not doing well or get  worse. Document Released: 03/02/2001 Document Revised: 01/01/2013 Document Reviewed: 10/26/2011 Exit[employer] Patient Information 2015 [employer]. This information is not intended to replace advice given to you by your health care provider. Make sure you discuss any questions you have with your health care provider.  

## 2015-02-17 LAB — RH IG WORKUP (INCLUDES ABO/RH)
ABO/RH(D): O NEG
Antibody Screen: NEGATIVE
GESTATIONAL AGE(WKS): 6
UNIT DIVISION: 0

## 2015-02-17 LAB — RAPID STREP SCREEN (MED CTR MEBANE ONLY): STREPTOCOCCUS, GROUP A SCREEN (DIRECT): POSITIVE — AB

## 2015-02-18 ENCOUNTER — Telehealth: Payer: Self-pay | Admitting: Family

## 2015-02-18 ENCOUNTER — Encounter: Payer: Self-pay | Admitting: Family

## 2015-02-18 ENCOUNTER — Ambulatory Visit: Admit: 2015-02-18 | Payer: Self-pay | Admitting: Obstetrics and Gynecology

## 2015-02-18 SURGERY — DILATION AND EVACUATION, UTERUS
Anesthesia: Choice

## 2015-02-18 MED ORDER — AMOXICILLIN 500 MG PO CAPS: 500.0000 mg | ORAL_CAPSULE | Freq: Three times a day (TID) | ORAL | Status: DC

## 2015-02-18 NOTE — Telephone Encounter (Signed)
Relation to pt: self  Call back number: [phone]   Reason for call:   Pt states she went to ED due to possible strep and never received results. Pt was on antibiotics and now is completely out pt in need of clinical advice symptoms have not improved.

## 2015-02-18 NOTE — Telephone Encounter (Signed)
Please see previous mychart message.

## 2015-03-12 ENCOUNTER — Encounter: Payer: Self-pay | Admitting: Family

## 2015-03-19 ENCOUNTER — Ambulatory Visit: Payer: No Typology Code available for payment source | Admitting: Family

## 2015-04-03 ENCOUNTER — Telehealth: Payer: Self-pay | Admitting: Family

## 2015-04-03 NOTE — Telephone Encounter (Signed)
Sent mychart message to pt re: f/u.

## 2015-04-03 NOTE — Telephone Encounter (Signed)
05/23/15

## 2015-04-03 NOTE — Telephone Encounter (Signed)
Levothyroxine refill sent to pharmacy.  Pt last TSH was 11/21/14. Pt seen 01/2015 and has no future appts scheduled.  When should pt follow up in the office ?

## 2015-04-07 ENCOUNTER — Telehealth: Payer: Self-pay | Admitting: *Deleted

## 2015-04-07 NOTE — Telephone Encounter (Signed)
Patient is requesting her levothyroxine (SYNTHROID, LEVOTHROID) 137 MCG tablet be changed to a different manufacturer because it is cheaper and [facility] states they have to get provider approval.  OK to change?

## 2015-04-07 NOTE — Telephone Encounter (Signed)
Ok

## 2015-04-07 NOTE — Telephone Encounter (Signed)
Please see mychart message from pt re: appointment.

## 2015-04-08 NOTE — Telephone Encounter (Signed)
Spoke w AJ at [facility] and Efraim KaufmannMelissa gave approval for levothyoxine (Synthroid, Levothroid) to change manufacturer.

## 2015-04-11 ENCOUNTER — Ambulatory Visit (INDEPENDENT_AMBULATORY_CARE_PROVIDER_SITE_OTHER): Payer: No Typology Code available for payment source | Admitting: Family

## 2015-04-11 ENCOUNTER — Encounter: Payer: Self-pay | Admitting: Family

## 2015-04-11 VITALS — BP 118/76 | HR 71 | Temp 98.1°F | Resp 16 | Ht 66.5 in | Wt 206.6 lb

## 2015-04-11 DIAGNOSIS — F419 Anxiety disorder, unspecified: Secondary | ICD-10-CM

## 2015-04-11 DIAGNOSIS — E039 Hypothyroidism, unspecified: Secondary | ICD-10-CM | POA: Diagnosis not present

## 2015-04-11 DIAGNOSIS — F329 Major depressive disorder, single episode, unspecified: Secondary | ICD-10-CM

## 2015-04-11 DIAGNOSIS — F32A Depression, unspecified: Secondary | ICD-10-CM

## 2015-04-11 DIAGNOSIS — F418 Other specified anxiety disorders: Secondary | ICD-10-CM | POA: Diagnosis not present

## 2015-04-11 LAB — TSH: TSH: 0.88 u[IU]/mL (ref 0.35–4.50)

## 2015-04-11 MED ORDER — CITALOPRAM HYDROBROMIDE 40 MG PO TABS: 40.0000 mg | ORAL_TABLET | Freq: Every day | ORAL | Status: DC

## 2015-04-11 NOTE — Progress Notes (Signed)
Pre visit review using our clinic review tool, if applicable. No additional management support is needed unless otherwise documented below in the visit note. 

## 2015-04-11 NOTE — Patient Instructions (Signed)
Please complete lab work prior to leaving.   

## 2015-04-11 NOTE — Assessment & Plan Note (Signed)
Obtain follow up TSH, continue synthroid.  

## 2015-04-11 NOTE — Progress Notes (Signed)
Subjective:    Patient ID: Jessica Villa, female    DOB: [birth year], 34 y.o.   MRN: [id number]  HPI  Jessica Villa is a 34 yr old female who presents today to follow up. She was last seen on 5/18 with c/o anxiety and depression and was [redacted] weeks pregnant at that time.  Unfortunately, she miscarried that pregnancy shortly after her pregnancy. She continued on 40 mg of her citalopram since that time. She reports that overall she is doing well   Hypothyroid- maintained on synthroid. Tired, gaining weight.   Lab Results  Component Value Date   TSH 0.43 11/21/2014   Wt Readings from Last 3 Encounters:  04/11/15 206 lb 9.6 oz (93.713 kg)  02/16/15 200 lb 4 oz (90.833 kg)  02/05/15 200 lb 3.2 oz (90.81 kg)   Reports heavy menstrual bleeding x 1 month which stopped last Wednesday.  She saw her GYN yesterday was told likely related to hormones and not related to "retained tissue."    Review of Systems See HPI  Past Medical History  Diagnosis Date  . Thyroid disease     hypothyroid  . Depression   . Eczema   . History of ovarian cyst   . Anemia   . Hypothyroidism     History   Social History  . Marital Status: Married    Spouse Name: N/A  . Number of Children: N/A  . Years of Education: N/A   Occupational History  . Not on file.   Social History Main Topics  . Smoking status: Never Smoker   . Smokeless tobacco: Never Used  . Alcohol Use: Yes     Comment: 1 cocktail or wine per month none since pregnancy  . Drug Use: No  . Sexual Activity: Not on file   Other Topics Concern  . Not on file   Social History Narrative   2 step children ( 60 year and 30 year old) lives with pt and her husband.    Stay at home mom   Has masters degree in education leadership   Has worked as a Dance movement psychotherapist, crocheting, no pets    Past Surgical History  Procedure Laterality Date  . No past surgeries      Family History  Problem Relation Age of Onset  . Depression  Mother   . Dementia Father   . Diabetes Father   . Depression Father   . Hypothyroidism Father   . Alcohol abuse Maternal Grandmother   . Alcohol abuse Maternal Grandfather   . Hypothyroidism Paternal Grandmother   . Heart disease Paternal Grandfather   . Diabetes Paternal Grandfather   . Skin cancer Paternal Grandfather     No Known Allergies  Current Outpatient Prescriptions on File Prior to Visit  Medication Sig Dispense Refill  . citalopram (CELEXA) 40 MG tablet Take 40 mg by mouth daily.    . clotrimazole-betamethasone (LOTRISONE) cream APPLY ONE APPLICATION TOPICALLY 2 TIMES DAILY 45 g 0  . levothyroxine (SYNTHROID, LEVOTHROID) 137 MCG tablet TAKE ONE TABLET BY MOUTH ONE TIME DAILY BEFORE BREAKFAST 30 tablet 2  . Prenatal Vit-Fe Fumarate-FA (PRENATAL MULTIVITAMIN) TABS tablet Take 1 tablet by mouth daily at 12 noon.     No current facility-administered medications on file prior to visit.    BP 118/76 mmHg  Pulse 71  Temp(Src) 98.1 F (36.7 C) (Oral)  Resp 16  Ht 5' 6.5" (1.689 m)  Wt 206 lb 9.6 oz (  93.713 kg)  BMI 32.85 kg/m2  SpO2 99%  LMP 04/09/2015       Objective:   Physical Exam  Constitutional: She is oriented to person, place, and time. She appears well-developed and well-nourished.  HENT:  Head: Normocephalic and atraumatic.  Cardiovascular: Normal rate, regular rhythm and normal heart sounds.   No murmur heard. Pulmonary/Chest: Effort normal and breath sounds normal. No respiratory distress. She has no wheezes.  Neurological: She is alert and oriented to person, place, and time.  Skin: Skin is warm and dry.  Psychiatric: She has a normal mood and affect. Her behavior is normal. Judgment and thought content normal.          Assessment & Plan:

## 2015-04-11 NOTE — Assessment & Plan Note (Signed)
Stable on citalopram. Continue same.  

## 2015-04-12 ENCOUNTER — Encounter: Payer: Self-pay | Admitting: Family

## 2015-04-14 NOTE — Telephone Encounter (Signed)
FYI:  Please see mychart message / response re: physical and stomach / back pain on the same visit. Also wanted to let you know that they scheduled pt in a 30 minute slot but this would put 3, 30 minute appointments back to back for [place] that day.

## 2015-05-05 ENCOUNTER — Encounter: Payer: Self-pay | Admitting: Family

## 2015-05-06 MED ORDER — DESONIDE 0.05 % EX CREA: TOPICAL_CREAM | Freq: Two times a day (BID) | CUTANEOUS | Status: DC

## 2015-05-14 ENCOUNTER — Other Ambulatory Visit: Payer: Self-pay | Admitting: Family

## 2015-05-16 ENCOUNTER — Encounter: Payer: Self-pay | Admitting: Family

## 2015-05-16 NOTE — Telephone Encounter (Signed)
Tried to talk with pt directly since she is on high dose celexa. And wanted to assess her mood before adding medication. Considering based on review of her chart adding trazadone if she has problems sleeping. And maybe brief low dose benzo over weekend  If anxious until [facility] NP back in office.

## 2015-06-01 ENCOUNTER — Other Ambulatory Visit: Payer: Self-pay | Admitting: Family

## 2015-07-09 ENCOUNTER — Other Ambulatory Visit: Payer: Self-pay | Admitting: Family

## 2015-07-09 NOTE — Telephone Encounter (Signed)
Request for refill on: Levothyroxine 137 mcg Last Rx: 07.14.16, #30x2 Last OV: 07.22.16 Return: 6-mths Rx [facility]

## 2015-07-15 LAB — OB RESULTS CONSOLE ANTIBODY SCREEN: Antibody Screen: NEGATIVE

## 2015-07-15 LAB — OB RESULTS CONSOLE GBS: STREP GROUP B AG: NEGATIVE

## 2015-07-15 LAB — OB RESULTS CONSOLE ABO/RH: RH Type: NEGATIVE

## 2015-07-15 LAB — OB RESULTS CONSOLE RPR: RPR: NONREACTIVE

## 2015-07-15 LAB — OB RESULTS CONSOLE HEPATITIS B SURFACE ANTIGEN: Hepatitis B Surface Ag: NEGATIVE

## 2015-07-15 LAB — OB RESULTS CONSOLE HIV ANTIBODY (ROUTINE TESTING): HIV: NONREACTIVE

## 2015-07-15 LAB — OB RESULTS CONSOLE RUBELLA ANTIBODY, IGM: Rubella: IMMUNE

## 2015-07-15 LAB — OB RESULTS CONSOLE GC/CHLAMYDIA
CHLAMYDIA, DNA PROBE: NEGATIVE
GC PROBE AMP, GENITAL: NEGATIVE

## 2015-10-13 ENCOUNTER — Ambulatory Visit: Payer: No Typology Code available for payment source | Admitting: Family

## 2015-11-24 ENCOUNTER — Encounter: Payer: Self-pay | Admitting: Family

## 2015-11-24 ENCOUNTER — Ambulatory Visit (INDEPENDENT_AMBULATORY_CARE_PROVIDER_SITE_OTHER): Payer: BLUE CROSS/BLUE SHIELD | Admitting: Family

## 2015-11-24 VITALS — BP 123/68 | HR 86 | Temp 97.8°F | Ht 66.5 in | Wt 229.6 lb

## 2015-11-24 DIAGNOSIS — F329 Major depressive disorder, single episode, unspecified: Secondary | ICD-10-CM

## 2015-11-24 DIAGNOSIS — R52 Pain, unspecified: Secondary | ICD-10-CM

## 2015-11-24 DIAGNOSIS — R6883 Chills (without fever): Secondary | ICD-10-CM | POA: Diagnosis not present

## 2015-11-24 DIAGNOSIS — F418 Other specified anxiety disorders: Secondary | ICD-10-CM

## 2015-11-24 DIAGNOSIS — F32A Depression, unspecified: Secondary | ICD-10-CM

## 2015-11-24 DIAGNOSIS — J209 Acute bronchitis, unspecified: Secondary | ICD-10-CM

## 2015-11-24 DIAGNOSIS — E039 Hypothyroidism, unspecified: Secondary | ICD-10-CM

## 2015-11-24 DIAGNOSIS — J40 Bronchitis, not specified as acute or chronic: Secondary | ICD-10-CM

## 2015-11-24 DIAGNOSIS — F419 Anxiety disorder, unspecified: Secondary | ICD-10-CM

## 2015-11-24 LAB — POCT INFLUENZA A/B
INFLUENZA B, POC: NEGATIVE
Influenza A, POC: NEGATIVE

## 2015-11-24 NOTE — Progress Notes (Signed)
Subjective:    Patient ID: Jessica Villa, female    DOB: November 13, 1980, 35 y.o.   MRN: [id number]  HPI  Jessica Villa is a 35 yr old female pregnant female who presents today with several concerns:  1) Cough-  Reports non-productive cough x 2 days.  Denies associated fever. Reports 2 sick children at home.  She is currently 7 months pregnant and is being followed by Dr. Henderson Cloudomblin (OB/GYN). Does associated clear nasal drainage, body aches and dizziness. She is taking otc mucinex. Reports mild sore throat from drainage.     2) Hypothyroid- reports that GYN checked TSH and she is up to 175mcg.  Feeling well on this dose.  3) Depression- patient reports that she did not tolerate weaning off of citalopram previously.  Her OB is aware that she is continuing citalopram.    Review of Systems See HPI  Past Medical History  Diagnosis Date  . Thyroid disease     hypothyroid  . Depression   . Eczema   . History of ovarian cyst   . Anemia   . Hypothyroidism     Social History   Social History  . Marital Status: Married    Spouse Name: N/A  . Number of Children: N/A  . Years of Education: N/A   Occupational History  . Not on file.   Social History Main Topics  . Smoking status: Never Smoker   . Smokeless tobacco: Never Used  . Alcohol Use: Yes     Comment: 1 cocktail or wine per month none since pregnancy  . Drug Use: No  . Sexual Activity: Not on file   Other Topics Concern  . Not on file   Social History Narrative   2 step children ( 624 year and 37 year old) lives with pt and her husband.    Stay at home mom   Has masters degree in education leadership   Has worked as a Dance movement psychotherapistteacher   Enjoys hiking   Crafts, crocheting, no pets    Past Surgical History  Procedure Laterality Date  . No past surgeries      Family History  Problem Relation Age of Onset  . Depression Mother   . Dementia Father   . Diabetes Father   . Depression Father   . Hypothyroidism Father   . Alcohol  abuse Maternal Grandmother   . Alcohol abuse Maternal Grandfather   . Hypothyroidism Paternal Grandmother   . Heart disease Paternal Grandfather   . Diabetes Paternal Grandfather   . Skin cancer Paternal Grandfather     No Known Allergies  Current Outpatient Prescriptions on File Prior to Visit  Medication Sig Dispense Refill  . citalopram (CELEXA) 40 MG tablet TAKE ONE TABLET BY MOUTH ONE TIME DAILY 90 tablet 1  . Prenatal Vit-Fe Fumarate-FA (PRENATAL MULTIVITAMIN) TABS tablet Take 1 tablet by mouth daily at 12 noon.     No current facility-administered medications on file prior to visit.    BP 123/68 mmHg  Pulse 86  Temp(Src) 97.8 F (36.6 C) (Oral)  Ht 5' 6.5" (1.689 m)  Wt 229 lb 9.6 oz (104.146 kg)  BMI 36.51 kg/m2  SpO2 99%  LMP 04/09/2015       Objective:   Physical Exam  Constitutional: She is oriented to person, place, and time. She appears well-developed and well-nourished.  HENT:  Head: Normocephalic and atraumatic.  Right Ear: Tympanic membrane and ear canal normal.  Left Ear: Tympanic membrane and ear canal normal.  Mouth/Throat: No oropharyngeal exudate, posterior oropharyngeal edema or posterior oropharyngeal erythema.  Cardiovascular: Normal rate, regular rhythm and normal heart sounds.   No murmur heard. Pulmonary/Chest: Effort normal and breath sounds normal. No respiratory distress. She has no wheezes.  Musculoskeletal: She exhibits no edema.  Lymphadenopathy:    She has no cervical adenopathy.  Neurological: She is alert and oriented to person, place, and time.  Psychiatric: She has a normal mood and affect. Her behavior is normal. Judgment and thought content normal.          Assessment & Plan:  Viral Bronchitis- Advised pt on supportive measures and follow up:  Make sure to drink plenty of fluids. OK to use mucinex as needed. Tylenol as needed for aches/pains.  Continue nasal saline rinses as needed. To help you breath at night you may use  breath right strips. Try placing a humidfier in your bedroom to help with cough/congestion. Call if cough worsens, if you develop fever >101 or if you are not feeling better in 1 week.

## 2015-11-24 NOTE — Progress Notes (Signed)
Pre visit review using our clinic review tool, if applicable. No additional management support is needed unless otherwise documented below in the visit note. 

## 2015-11-24 NOTE — Patient Instructions (Signed)
Make sure to drink plenty of fluids. OK to use mucinex as needed. Tylenol as needed for aches/pains. i Continue nasal saline rinses as needed. To help you breath at night you may use breath right strips. Try placing a humidfier in your bedroom to help with cough/congestion. Call if cough worsens, if you develop fever >101 or if you are not feeling better in 1 week.

## 2015-11-27 ENCOUNTER — Ambulatory Visit (INDEPENDENT_AMBULATORY_CARE_PROVIDER_SITE_OTHER): Payer: BLUE CROSS/BLUE SHIELD | Admitting: Family

## 2015-11-27 ENCOUNTER — Encounter: Payer: Self-pay | Admitting: Family

## 2015-11-27 VITALS — BP 124/72 | HR 112 | Temp 98.4°F | Ht 66.5 in | Wt 228.1 lb

## 2015-11-27 DIAGNOSIS — J209 Acute bronchitis, unspecified: Secondary | ICD-10-CM

## 2015-11-27 MED ORDER — AMOXICILLIN 500 MG PO CAPS: 500.0000 mg | ORAL_CAPSULE | Freq: Three times a day (TID) | ORAL | Status: DC

## 2015-11-27 MED ORDER — LORATADINE 10 MG PO TABS: 10.0000 mg | ORAL_TABLET | Freq: Every day | ORAL | Status: DC

## 2015-11-27 NOTE — Patient Instructions (Signed)
Start amoxicillin for bronchitis. Continue nasal saline spray to clear your sinuses. Add claritin once daily as needed for nasal congestion.  Drink lots of fluid- 8 glasses of water a day.  Call if symptoms worsen, if fever, or if not feeling better in 2-3 days.

## 2015-11-27 NOTE — Progress Notes (Signed)
Subjective:    Patient ID: Jessica Villa, female    DOB: Dec 15, 1980, 35 y.o.   MRN: [id number]  HPI  Jessica Villa is a 35 yr old female pregnant female who presents today for follow up. She was seen on 11/24/15 with 2 day hx of cough- felt to be viral at that time.  Today she reports that her cough has worsened.  Has to sit up in the middle of the night to catch her breath due to cough.  Reports cough is very deep.  Cough is productive.  Nasal drainage is clear to yellow.  Denies fever but has had some night sweats the last couple of nights. Occasional chills.  Trouble breathing out her nose. Trying the nasal spray, breath right strips with minimal improvement in her nasal congestion.    Review of Systems Past Medical History  Diagnosis Date  . Thyroid disease     hypothyroid  . Depression   . Eczema   . History of ovarian cyst   . Anemia   . Hypothyroidism     Social History   Social History  . Marital Status: Married    Spouse Name: N/A  . Number of Children: N/A  . Years of Education: N/A   Occupational History  . Not on file.   Social History Main Topics  . Smoking status: Never Smoker   . Smokeless tobacco: Never Used  . Alcohol Use: Yes     Comment: 1 cocktail or wine per month none since pregnancy  . Drug Use: No  . Sexual Activity: Not on file   Other Topics Concern  . Not on file   Social History Narrative   2 step children ( 654 year and 37 year old) lives with pt and her husband.    Stay at home mom   Has masters degree in education leadership   Has worked as a Dance movement psychotherapistteacher   Enjoys hiking   Crafts, crocheting, no pets    Past Surgical History  Procedure Laterality Date  . No past surgeries      Family History  Problem Relation Age of Onset  . Depression Mother   . Dementia Father   . Diabetes Father   . Depression Father   . Hypothyroidism Father   . Alcohol abuse Maternal Grandmother   . Alcohol abuse Maternal Grandfather   . Hypothyroidism Paternal  Grandmother   . Heart disease Paternal Grandfather   . Diabetes Paternal Grandfather   . Skin cancer Paternal Grandfather     No Known Allergies  Current Outpatient Prescriptions on File Prior to Visit  Medication Sig Dispense Refill  . citalopram (CELEXA) 40 MG tablet TAKE ONE TABLET BY MOUTH ONE TIME DAILY 90 tablet 1  . DICLEGIS 10-10 MG TBEC TAKE 2 TABLETS AT BEDTIME, 1 TABLET IN THE MORNING, AND 1 TABLET AFTER LUNCH AS NEEDED FOR NAUSEA.  1  . levothyroxine (SYNTHROID, LEVOTHROID) 175 MCG tablet   1  . nystatin-triamcinolone (MYCOLOG II) cream APPLY TWO TIMES DAILY AS NEEDED  5  . Prenatal Vit-Fe Fumarate-FA (PRENATAL MULTIVITAMIN) TABS tablet Take 1 tablet by mouth daily at 12 noon.     No current facility-administered medications on file prior to visit.    BP 124/72 mmHg  Pulse 112  Temp(Src) 98.4 F (36.9 C) (Oral)  Ht 5' 6.5" (1.689 m)  Wt 228 lb 2 oz (103.477 kg)  BMI 36.27 kg/m2  SpO2 97%  LMP 04/09/2015       Objective:  Physical Exam  Constitutional: She is oriented to person, place, and time. She appears well-developed and well-nourished. No distress.  HENT:  Head: Normocephalic and atraumatic.  Right Ear: Tympanic membrane and ear canal normal.  Left Ear: Tympanic membrane and ear canal normal.  Mouth/Throat: No oropharyngeal exudate, posterior oropharyngeal edema or posterior oropharyngeal erythema.  Cardiovascular: Normal rate and regular rhythm.   No murmur heard. Pulmonary/Chest: Effort normal. No respiratory distress. She has no wheezes. She has no rales. She exhibits no tenderness.  Some mild upper airway rhonchi.  Lymphadenopathy:    She has no cervical adenopathy.  Neurological: She is alert and oriented to person, place, and time.  Psychiatric: She has a normal mood and affect. Her behavior is normal. Judgment and thought content normal.          Assessment & Plan:  Bronchitis- symptoms worsening.  I suspect mild dehydration as well due  to mild tachycardia. Oxygen sat is good.  Afebrile. Trial of amoxicillin.  Try honey and lemon/humidifier in bedroom to help with cough. Add claritin once daily as needed.  Pt is advised to call for follow up as outlined in AVS.

## 2015-11-27 NOTE — Progress Notes (Signed)
Pre visit review using our clinic review tool, if applicable. No additional management support is needed unless otherwise documented below in the visit note. 

## 2015-11-28 ENCOUNTER — Telehealth: Payer: Self-pay | Admitting: Family

## 2015-11-28 NOTE — Assessment & Plan Note (Signed)
Clinically stable on synthroid. Dose recently adjusted by GYN.

## 2015-11-28 NOTE — Telephone Encounter (Signed)
See my chart message

## 2015-11-28 NOTE — Assessment & Plan Note (Signed)
Stable on citalopram. Benefit to pt seems to outweigh pregnancy risk.

## 2015-12-06 ENCOUNTER — Other Ambulatory Visit: Payer: Self-pay | Admitting: Family

## 2016-01-14 DIAGNOSIS — R112 Nausea with vomiting, unspecified: Secondary | ICD-10-CM | POA: Diagnosis not present

## 2016-01-14 DIAGNOSIS — Z36 Encounter for antenatal screening of mother: Secondary | ICD-10-CM | POA: Diagnosis not present

## 2016-02-11 ENCOUNTER — Inpatient Hospital Stay (HOSPITAL_COMMUNITY)
Admission: AD | Admit: 2016-02-11 | Payer: BLUE CROSS/BLUE SHIELD | Source: Ambulatory Visit | Admitting: Obstetrics and Gynecology

## 2016-02-11 SURGERY — Surgical Case
Anesthesia: Regional

## 2016-02-17 ENCOUNTER — Encounter (HOSPITAL_COMMUNITY): Payer: Self-pay | Admitting: *Deleted

## 2016-02-17 ENCOUNTER — Telehealth (HOSPITAL_COMMUNITY): Payer: Self-pay | Admitting: *Deleted

## 2016-02-17 LAB — OB RESULTS CONSOLE GBS: STREP GROUP B AG: NEGATIVE

## 2016-02-17 NOTE — Telephone Encounter (Signed)
Preadmission screen  

## 2016-02-18 ENCOUNTER — Inpatient Hospital Stay (HOSPITAL_COMMUNITY): Payer: BLUE CROSS/BLUE SHIELD | Admitting: Anesthesiology

## 2016-02-18 ENCOUNTER — Inpatient Hospital Stay (HOSPITAL_COMMUNITY)
Admission: RE | Admit: 2016-02-18 | Discharge: 2016-02-21 | DRG: 766 | Disposition: A | Discharge status: Home / Self Care | Payer: BLUE CROSS/BLUE SHIELD | Source: Ambulatory Visit | Attending: Obstetrics and Gynecology | Admitting: Obstetrics and Gynecology

## 2016-02-18 ENCOUNTER — Encounter (HOSPITAL_COMMUNITY): Payer: Self-pay

## 2016-02-18 ENCOUNTER — Encounter (HOSPITAL_COMMUNITY)
Admission: RE | Disposition: A | Discharge status: Home / Self Care | Payer: Self-pay | Source: Ambulatory Visit | Attending: Obstetrics and Gynecology

## 2016-02-18 DIAGNOSIS — O480 Post-term pregnancy: Secondary | ICD-10-CM | POA: Diagnosis present

## 2016-02-18 DIAGNOSIS — Z808 Family history of malignant neoplasm of other organs or systems: Secondary | ICD-10-CM | POA: Diagnosis not present

## 2016-02-18 DIAGNOSIS — Z833 Family history of diabetes mellitus: Secondary | ICD-10-CM

## 2016-02-18 DIAGNOSIS — E039 Hypothyroidism, unspecified: Secondary | ICD-10-CM | POA: Diagnosis not present

## 2016-02-18 DIAGNOSIS — O99284 Endocrine, nutritional and metabolic diseases complicating childbirth: Secondary | ICD-10-CM | POA: Diagnosis not present

## 2016-02-18 DIAGNOSIS — Z3A40 40 weeks gestation of pregnancy: Secondary | ICD-10-CM

## 2016-02-18 DIAGNOSIS — O99214 Obesity complicating childbirth: Secondary | ICD-10-CM | POA: Diagnosis not present

## 2016-02-18 DIAGNOSIS — Z6838 Body mass index (BMI) 38.0-38.9, adult: Secondary | ICD-10-CM | POA: Diagnosis not present

## 2016-02-18 DIAGNOSIS — E669 Obesity, unspecified: Secondary | ICD-10-CM | POA: Diagnosis present

## 2016-02-18 DIAGNOSIS — Z818 Family history of other mental and behavioral disorders: Secondary | ICD-10-CM | POA: Diagnosis not present

## 2016-02-18 DIAGNOSIS — Z8249 Family history of ischemic heart disease and other diseases of the circulatory system: Secondary | ICD-10-CM | POA: Diagnosis not present

## 2016-02-18 DIAGNOSIS — O43813 Placental infarction, third trimester: Secondary | ICD-10-CM | POA: Diagnosis not present

## 2016-02-18 LAB — CBC
HCT: 33.6 % — ABNORMAL LOW (ref 36.0–46.0)
HEMOGLOBIN: 11.4 g/dL — AB (ref 12.0–15.0)
MCH: 29.7 pg (ref 26.0–34.0)
MCHC: 33.9 g/dL (ref 30.0–36.0)
MCV: 87.5 fL (ref 78.0–100.0)
Platelets: 194 10*3/uL (ref 150–400)
RBC: 3.84 MIL/uL — AB (ref 3.87–5.11)
RDW: 14.3 % (ref 11.5–15.5)
WBC: 10.1 10*3/uL (ref 4.0–10.5)

## 2016-02-18 LAB — TYPE AND SCREEN
ABO/RH(D): O NEG
ANTIBODY SCREEN: NEGATIVE

## 2016-02-18 LAB — RPR: RPR: NONREACTIVE

## 2016-02-18 SURGERY — Surgical Case
Anesthesia: Epidural

## 2016-02-18 MED ORDER — TERBUTALINE SULFATE 1 MG/ML IJ SOLN: 0.2500 mg | Freq: Once | INTRAMUSCULAR | Status: DC | PRN

## 2016-02-18 MED ORDER — LIDOCAINE HCL (PF) 1 % IJ SOLN
INTRAMUSCULAR | Status: DC | PRN
  Administered 2016-02-18 (×2): 4 mL via EPIDURAL

## 2016-02-18 MED ORDER — BUPIVACAINE HCL (PF) 0.25 % IJ SOLN
INTRAMUSCULAR | Status: AC
  Filled 2016-02-18: qty 30

## 2016-02-18 MED ORDER — OXYCODONE-ACETAMINOPHEN 5-325 MG PO TABS: 2.0000 | ORAL_TABLET | ORAL | Status: DC | PRN

## 2016-02-18 MED ORDER — OXYTOCIN 40 UNITS IN LACTATED RINGERS INFUSION - SIMPLE MED: 2.5000 [IU]/h | INTRAVENOUS | Status: DC

## 2016-02-18 MED ORDER — EPHEDRINE 5 MG/ML INJ: 10.0000 mg | INTRAVENOUS | Status: DC | PRN

## 2016-02-18 MED ORDER — OXYTOCIN 40 UNITS IN LACTATED RINGERS INFUSION - SIMPLE MED
1.0000 m[IU]/min | INTRAVENOUS | Status: DC
  Administered 2016-02-18: 2 m[IU]/min via INTRAVENOUS
  Filled 2016-02-18: qty 1000

## 2016-02-18 MED ORDER — ACETAMINOPHEN 325 MG PO TABS
650.0000 mg | ORAL_TABLET | ORAL | Status: DC | PRN
  Administered 2016-02-18: 650 mg via ORAL
  Filled 2016-02-18: qty 2

## 2016-02-18 MED ORDER — CEFAZOLIN SODIUM-DEXTROSE 2-4 GM/100ML-% IV SOLN: 2.0000 g | INTRAVENOUS | Status: DC

## 2016-02-18 MED ORDER — OXYTOCIN BOLUS FROM INFUSION: 500.0000 mL | INTRAVENOUS | Status: DC

## 2016-02-18 MED ORDER — PHENYLEPHRINE 40 MCG/ML (10ML) SYRINGE FOR IV PUSH (FOR BLOOD PRESSURE SUPPORT)
80.0000 ug | PREFILLED_SYRINGE | INTRAVENOUS | Status: DC | PRN
Start: 2016-02-18 — End: 2016-02-19

## 2016-02-18 MED ORDER — MEPERIDINE HCL 25 MG/ML IJ SOLN: 6.2500 mg | INTRAMUSCULAR | Status: DC | PRN

## 2016-02-18 MED ORDER — BUTORPHANOL TARTRATE 1 MG/ML IJ SOLN: 1.0000 mg | INTRAMUSCULAR | Status: DC | PRN

## 2016-02-18 MED ORDER — PROMETHAZINE HCL 25 MG/ML IJ SOLN
INTRAMUSCULAR | Status: AC
  Filled 2016-02-18: qty 1

## 2016-02-18 MED ORDER — FLEET ENEMA 7-19 GM/118ML RE ENEM: 1.0000 | ENEMA | RECTAL | Status: DC | PRN

## 2016-02-18 MED ORDER — ONDANSETRON HCL 4 MG/2ML IJ SOLN
4.0000 mg | Freq: Four times a day (QID) | INTRAMUSCULAR | Status: DC | PRN
  Administered 2016-02-18: 4 mg via INTRAVENOUS
  Filled 2016-02-18: qty 2

## 2016-02-18 MED ORDER — SOD CITRATE-CITRIC ACID 500-334 MG/5ML PO SOLN
30.0000 mL | ORAL | Status: DC | PRN
  Administered 2016-02-18: 30 mL via ORAL
  Filled 2016-02-18: qty 15

## 2016-02-18 MED ORDER — SODIUM BICARBONATE 8.4 % IV SOLN
INTRAVENOUS | Status: DC | PRN
  Administered 2016-02-18: 4 mL via EPIDURAL
  Administered 2016-02-18: 2 mL via EPIDURAL
  Administered 2016-02-18: 8 mL via EPIDURAL
  Administered 2016-02-18: 3 mL via EPIDURAL
  Administered 2016-02-18: 7 mL via EPIDURAL
  Administered 2016-02-18: 3 mL via EPIDURAL

## 2016-02-18 MED ORDER — LACTATED RINGERS IV SOLN: 500.0000 mL | INTRAVENOUS | Status: DC | PRN

## 2016-02-18 MED ORDER — ONDANSETRON HCL 4 MG/2ML IJ SOLN
INTRAMUSCULAR | Status: AC
  Filled 2016-02-18: qty 2

## 2016-02-18 MED ORDER — CHLOROPROCAINE HCL (PF) 3 % IJ SOLN
INTRAMUSCULAR | Status: AC
  Filled 2016-02-18: qty 20

## 2016-02-18 MED ORDER — FENTANYL CITRATE (PF) 100 MCG/2ML IJ SOLN: 25.0000 ug | INTRAMUSCULAR | Status: DC | PRN

## 2016-02-18 MED ORDER — DIPHENHYDRAMINE HCL 50 MG/ML IJ SOLN: 12.5000 mg | INTRAMUSCULAR | Status: DC | PRN

## 2016-02-18 MED ORDER — OXYCODONE-ACETAMINOPHEN 5-325 MG PO TABS: 1.0000 | ORAL_TABLET | ORAL | Status: DC | PRN

## 2016-02-18 MED ORDER — LIDOCAINE HCL (PF) 1 % IJ SOLN: 30.0000 mL | INTRAMUSCULAR | Status: DC | PRN

## 2016-02-18 MED ORDER — ZOLPIDEM TARTRATE 5 MG PO TABS: 5.0000 mg | ORAL_TABLET | Freq: Every evening | ORAL | Status: DC | PRN

## 2016-02-18 MED ORDER — OXYTOCIN 10 UNIT/ML IJ SOLN
40.0000 [IU] | INTRAMUSCULAR | Status: DC | PRN
  Administered 2016-02-18: 40 [IU] via INTRAVENOUS

## 2016-02-18 MED ORDER — KETOROLAC TROMETHAMINE 30 MG/ML IJ SOLN: 30.0000 mg | Freq: Four times a day (QID) | INTRAMUSCULAR | Status: DC | PRN

## 2016-02-18 MED ORDER — LEVOTHYROXINE SODIUM 175 MCG PO TABS
175.0000 ug | ORAL_TABLET | Freq: Every day | ORAL | Status: DC
  Administered 2016-02-18: 175 ug via ORAL
  Filled 2016-02-18 (×2): qty 1

## 2016-02-18 MED ORDER — LACTATED RINGERS IV SOLN
INTRAVENOUS | Status: DC | PRN
  Administered 2016-02-18 (×2): via INTRAVENOUS

## 2016-02-18 MED ORDER — BUPIVACAINE HCL (PF) 0.25 % IJ SOLN
INTRAMUSCULAR | Status: DC | PRN
  Administered 2016-02-18: 20 mL

## 2016-02-18 MED ORDER — SODIUM BICARBONATE 8.4 % IV SOLN
INTRAVENOUS | Status: AC
  Filled 2016-02-18: qty 50

## 2016-02-18 MED ORDER — OXYTOCIN 10 UNIT/ML IJ SOLN
INTRAMUSCULAR | Status: AC
  Filled 2016-02-18: qty 4

## 2016-02-18 MED ORDER — MISOPROSTOL 25 MCG QUARTER TABLET
25.0000 ug | ORAL_TABLET | ORAL | Status: DC | PRN
  Administered 2016-02-18: 25 ug via VAGINAL
  Filled 2016-02-18: qty 0.25

## 2016-02-18 MED ORDER — CEFAZOLIN SODIUM-DEXTROSE 2-3 GM-% IV SOLR
INTRAVENOUS | Status: DC | PRN
  Administered 2016-02-18: 2 g via INTRAVENOUS

## 2016-02-18 MED ORDER — LACTATED RINGERS IV SOLN
INTRAVENOUS | Status: DC
  Administered 2016-02-18: 02:00:00 via INTRAVENOUS
  Administered 2016-02-18 (×2): 125 mL/h via INTRAVENOUS

## 2016-02-18 MED ORDER — FENTANYL CITRATE (PF) 100 MCG/2ML IJ SOLN
INTRAMUSCULAR | Status: DC | PRN
  Administered 2016-02-18: 100 ug via EPIDURAL

## 2016-02-18 MED ORDER — LACTATED RINGERS IV SOLN
500.0000 mL | Freq: Once | INTRAVENOUS | Status: AC
  Administered 2016-02-18: 500 mL via INTRAVENOUS

## 2016-02-18 MED ORDER — LACTATED RINGERS IV SOLN
INTRAVENOUS | Status: DC | PRN
  Administered 2016-02-18 (×2): via INTRAVENOUS

## 2016-02-18 MED ORDER — FENTANYL 2.5 MCG/ML BUPIVACAINE 1/10 % EPIDURAL INFUSION (WH - ANES)
14.0000 mL/h | INTRAMUSCULAR | Status: DC | PRN
  Administered 2016-02-18 (×2): 14 mL/h via EPIDURAL
  Filled 2016-02-18 (×2): qty 125

## 2016-02-18 MED ORDER — MORPHINE SULFATE (PF) 0.5 MG/ML IJ SOLN
INTRAMUSCULAR | Status: AC
  Filled 2016-02-18: qty 10

## 2016-02-18 MED ORDER — LIDOCAINE-EPINEPHRINE (PF) 2 %-1:200000 IJ SOLN
INTRAMUSCULAR | Status: AC
  Filled 2016-02-18: qty 20

## 2016-02-18 MED ORDER — PROCHLORPERAZINE EDISYLATE 5 MG/ML IJ SOLN: 10.0000 mg | Freq: Once | INTRAMUSCULAR | Status: DC | PRN

## 2016-02-18 MED ORDER — PHENYLEPHRINE 40 MCG/ML (10ML) SYRINGE FOR IV PUSH (FOR BLOOD PRESSURE SUPPORT)
PREFILLED_SYRINGE | INTRAVENOUS | Status: AC
  Filled 2016-02-18: qty 10

## 2016-02-18 MED ORDER — FENTANYL CITRATE (PF) 100 MCG/2ML IJ SOLN
INTRAMUSCULAR | Status: AC
  Filled 2016-02-18: qty 2

## 2016-02-18 MED ORDER — MIDAZOLAM HCL 2 MG/2ML IJ SOLN
INTRAMUSCULAR | Status: AC
  Filled 2016-02-18: qty 2

## 2016-02-18 MED ORDER — PHENYLEPHRINE 40 MCG/ML (10ML) SYRINGE FOR IV PUSH (FOR BLOOD PRESSURE SUPPORT)
80.0000 ug | PREFILLED_SYRINGE | INTRAVENOUS | Status: DC | PRN
  Filled 2016-02-18: qty 10

## 2016-02-18 MED ORDER — MIDAZOLAM HCL 2 MG/2ML IJ SOLN
INTRAMUSCULAR | Status: DC | PRN
  Administered 2016-02-18 (×2): 1 mg via INTRAVENOUS

## 2016-02-18 MED ORDER — MORPHINE SULFATE (PF) 0.5 MG/ML IJ SOLN
INTRAMUSCULAR | Status: DC | PRN
  Administered 2016-02-18: 4 mg via EPIDURAL

## 2016-02-18 MED ORDER — ONDANSETRON HCL 4 MG/2ML IJ SOLN
INTRAMUSCULAR | Status: DC | PRN
  Administered 2016-02-18: 4 mg via INTRAVENOUS

## 2016-02-18 SURGICAL SUPPLY — 34 items
BENZOIN TINCTURE PRP APPL 2/3 (GAUZE/BANDAGES/DRESSINGS) ×2 IMPLANT
CLAMP CORD UMBIL (MISCELLANEOUS) IMPLANT
CLOSURE STERI STRIP 1/2 X4 (GAUZE/BANDAGES/DRESSINGS) ×2 IMPLANT
CLOTH BEACON ORANGE TIMEOUT ST (SAFETY) ×2 IMPLANT
CONTAINER PREFILL 10% NBF 15ML (MISCELLANEOUS) IMPLANT
DRSG OPSITE POSTOP 4X10 (GAUZE/BANDAGES/DRESSINGS) ×2 IMPLANT
DRSG PAD ABDOMINAL 8X10 ST (GAUZE/BANDAGES/DRESSINGS) ×2 IMPLANT
DURAPREP 26ML APPLICATOR (WOUND CARE) ×2 IMPLANT
ELECT REM PT RETURN 9FT ADLT (ELECTROSURGICAL) ×2
ELECTRODE REM PT RTRN 9FT ADLT (ELECTROSURGICAL) ×1 IMPLANT
EXTRACTOR VACUUM M CUP 4 TUBE (SUCTIONS) IMPLANT
GLOVE BIO SURGEON STRL SZ8 (GLOVE) ×2 IMPLANT
GLOVE BIOGEL PI IND STRL 7.0 (GLOVE) ×1 IMPLANT
GLOVE BIOGEL PI INDICATOR 7.0 (GLOVE) ×1
GOWN STRL REUS W/TWL LRG LVL3 (GOWN DISPOSABLE) ×4 IMPLANT
KIT ABG SYR 3ML LUER SLIP (SYRINGE) ×2 IMPLANT
LIQUID BAND (GAUZE/BANDAGES/DRESSINGS) IMPLANT
NEEDLE HYPO 25X5/8 SAFETYGLIDE (NEEDLE) ×2 IMPLANT
NS IRRIG 1000ML POUR BTL (IV SOLUTION) ×2 IMPLANT
PACK C SECTION WH (CUSTOM PROCEDURE TRAY) ×2 IMPLANT
PAD OB MATERNITY 4.3X12.25 (PERSONAL CARE ITEMS) ×2 IMPLANT
PENCIL SMOKE EVAC W/HOLSTER (ELECTROSURGICAL) ×2 IMPLANT
SPONGE GAUZE 4X4 12PLY STER LF (GAUZE/BANDAGES/DRESSINGS) ×2 IMPLANT
STRIP CLOSURE SKIN 1/2X4 (GAUZE/BANDAGES/DRESSINGS) IMPLANT
SUT MNCRL 0 VIOLET CTX 36 (SUTURE) ×4 IMPLANT
SUT MONOCRYL 0 CTX 36 (SUTURE) ×4
SUT PDS AB 0 CTX 60 (SUTURE) ×2 IMPLANT
SUT PLAIN 0 NONE (SUTURE) IMPLANT
SUT PLAIN 2 0 (SUTURE)
SUT PLAIN 2 0 XLH (SUTURE) IMPLANT
SUT PLAIN ABS 2-0 CT1 27XMFL (SUTURE) IMPLANT
SUT VIC AB 4-0 KS 27 (SUTURE) ×2 IMPLANT
TOWEL OR 17X24 6PK STRL BLUE (TOWEL DISPOSABLE) ×2 IMPLANT
TRAY FOLEY CATH SILVER 14FR (SET/KITS/TRAYS/PACK) ×2 IMPLANT

## 2016-02-18 NOTE — H&P (Signed)
Jessica Villa is a 35 y.o. female presenting for IIOL. S/P cytotec last PM. No ROM. Maternal Medical History:  Fetal activity: Perceived fetal activity is normal.      OB History    Gravida Para Term Preterm AB TAB SAB Ectopic Multiple Living   2              Past Medical History  Diagnosis Date  . Thyroid disease     hypothyroid  . Depression   . Eczema   . History of ovarian cyst   . Anemia   . Hypothyroidism   . Hx of varicella    Past Surgical History  Procedure Laterality Date  . No past surgeries     Family History: family history includes Alcohol abuse in her maternal grandfather and maternal grandmother; Dementia in her father; Depression in her father and mother; Diabetes in her father and paternal grandfather; Heart disease in her paternal grandfather; Hypothyroidism in her father and paternal grandmother; Migraines in her mother; Skin cancer in her paternal grandfather. Social History:  reports that she has never smoked. She has never used smokeless tobacco. She reports that she drinks alcohol. She reports that she does not use illicit drugs.   Prenatal Transfer Tool  Maternal Diabetes: No Genetic Screening: Normal Maternal Ultrasounds/Referrals: Normal Fetal Ultrasounds or other Referrals:  None Maternal Substance Abuse:  No Significant Maternal Medications:  None Significant Maternal Lab Results:  None Other Comments:  None  Review of Systems  Eyes: Negative for blurred vision.  Gastrointestinal: Negative for abdominal pain.  Neurological: Negative for headaches.    Dilation: 2 Effacement (%): 80 Station: -3 Exam by:: Jeovany Huitron Blood pressure 124/76, pulse 67, temperature 98.7 F (37.1 C), temperature source Oral, resp. rate 18, height 5\' 7"  (1.702 m), weight 243 lb (110.224 kg), last menstrual period 04/24/2015, unknown if currently breastfeeding. Maternal Exam:  Uterine Assessment: Contraction strength is moderate.  Contraction frequency is irregular.    Abdomen: Patient reports no abdominal tenderness. Fetal presentation: vertex     Fetal Exam Fetal State Assessment: Category I - tracings are normal.     Physical Exam  Cardiovascular: Normal rate and regular rhythm.   Respiratory: Effort normal and breath sounds normal.  GI: Soft. There is no tenderness.  Neurological: She has normal reflexes.    Cx 2/80/-3/vtx FHT cat one UCs irregular, moderate  Prenatal labs: ABO, Rh: --/--/O NEG (05/31 0210) Antibody: NEG (05/31 0210) Rubella: Immune (10/25 0000) RPR: Nonreactive (10/25 0000)  HBsAg: Negative (10/25 0000)  HIV: Non-reactive (10/25 0000)  GBS: Negative (05/30 0000)   Assessment/Plan: 35 yo G2P0 for two stage induction of labor D/W patient and husband process D/W pitocin and risks-will begin pitocin augmentation Patient states she understands and agrees   Hanan Moen II,Shaliah Wann E 02/18/2016, 7:29 AM

## 2016-02-18 NOTE — Progress Notes (Signed)
FHT prolonged decel to 60s x 4-5 min resolved with IV fluids, O2 and knee/chest position. Now FHT 110s-120s Cx no change Rec: Cesarean section. D/W risks including infection, organ damage, bleeding/transfusion-HIV/Hep, DVT/PE, pneumonia. Patient states she understands and agrees

## 2016-02-18 NOTE — Progress Notes (Signed)
Cx 4/C/0 UCs q2-3 min FHT + accels, some decels- occasionally late, good response to scalp stim

## 2016-02-18 NOTE — Brief Op Note (Signed)
02/18/2016  11:01 PM  PATIENT:  Jessica Villa  35 y.o. female  PRE-OPERATIVE DIAGNOSIS:  fetal intolerance to labor  POST-OPERATIVE DIAGNOSIS:  Fetal intolerance to labor  PROCEDURE:  Procedure(s): CESAREAN SECTION (N/A)  SURGEON:  Surgeon(s) and Role:    * Harold HedgeJames Angeliah Wisdom, MD - Primary  PHYSICIAN ASSISTANT:   ASSISTANTS: none   ANESTHESIA:   epidural  EBL:  Total I/O In: 1000 [I.V.:1000] Out: 1350 [Urine:950; Blood:400]  BLOOD ADMINISTERED:none  DRAINS: Urinary Catheter (Foley)   LOCAL MEDICATIONS USED:  MARCAINE    and Amount: 20 ml  SPECIMEN:  Source of Specimen:  placenta  DISPOSITION OF SPECIMEN:  PATHOLOGY  COUNTS:  YES  TOURNIQUET:  * No tourniquets in log *  DICTATION: .Other Dictation: Dictation Number [id number]  PLAN OF CARE: Admit to inpatient   PATIENT DISPOSITION:  PACU - hemodynamically stable.   Delay start of Pharmacological VTE agent (>24hrs) due to surgical blood loss or risk of bleeding: yes

## 2016-02-18 NOTE — Progress Notes (Signed)
Orders top restart Pitocin at  In 20 min at 10mu.

## 2016-02-18 NOTE — Anesthesia Procedure Notes (Signed)
Epidural Patient location during procedure: OB Start time: 02/18/2016 1:27 PM  Staffing Anesthesiologist: Mal AmabileFOSTER, Ayrianna Mcginniss Performed by: anesthesiologist   Preanesthetic Checklist Completed: patient identified, site marked, surgical consent, pre-op evaluation, timeout performed, IV checked, risks and benefits discussed and monitors and equipment checked  Epidural Patient position: sitting Prep: site prepped and draped and DuraPrep Patient monitoring: continuous pulse ox and blood pressure Approach: midline Location: L3-L4 Injection technique: LOR air  Needle:  Needle type: Tuohy  Needle gauge: 17 G Needle length: 9 cm and 9 Needle insertion depth: 5 cm cm Catheter type: closed end flexible Catheter size: 19 Gauge Catheter at skin depth: 10 cm Test dose: negative and Other  Assessment Events: blood not aspirated, injection not painful, no injection resistance, negative IV test and no paresthesia  Additional Notes Patient identified. Risks and benefits discussed including failed block, incomplete  Pain control, post dural puncture headache, nerve damage, paralysis, blood pressure Changes, nausea, vomiting, reactions to medications-both toxic and allergic and post Partum back pain. All questions were answered. Patient expressed understanding and wished to proceed. Sterile technique was used throughout procedure. Epidural site was Dressed with sterile barrier dressing. No paresthesias, signs of intravascular injection Or signs of intrathecal spread were encountered.  Patient was more comfortable after the epidural was dosed. Please see RN's note for documentation of vital signs and FHR which are stable.

## 2016-02-18 NOTE — Anesthesia Pain Management Evaluation Note (Signed)
  CRNA Pain Management Visit Note  Patient: Jessica Villa, 35 y.o., female  "Hello I am a member of the anesthesia team at [facility]. We have an anesthesia team available at all times to provide care throughout the hospital, including epidural management and anesthesia for C-section. I don't know your plan for the delivery whether it a natural birth, water birth, IV sedation, nitrous supplementation, doula or epidural, but we want to meet your pain goals."   1.Was your pain managed to your expectations on prior hospitalizations?   Yes   2.What is your expectation for pain management during this hospitalization?     Epidural  3.How can we help you reach that goal? epidural  Record the patient's initial score and the patient's pain goal.   Pain: 4  Pain Goal: 6 [facility] wants you to be able to say your pain was always managed very well.  Cephus ShellingBURGER,Aalaysia Liggins 02/18/2016

## 2016-02-18 NOTE — Progress Notes (Signed)
Cx 2-3/90/-2 IUPC placed FHT reactive, one decel with sitting up, corrected with position change Epidural in

## 2016-02-18 NOTE — Anesthesia Preprocedure Evaluation (Addendum)
Anesthesia Evaluation  Patient identified by MRN, date of birth, ID band Patient awake    Reviewed: Allergy & Precautions, NPO status , Patient's Chart, lab work & pertinent test results  Airway Mallampati: III  TM Distance: >3 FB Neck ROM: Full    Dental no notable dental hx. (+) Teeth Intact   Pulmonary neg pulmonary ROS,    Pulmonary exam normal breath sounds clear to auscultation       Cardiovascular negative cardio ROS Normal cardiovascular exam Rhythm:Regular Rate:Normal     Neuro/Psych PSYCHIATRIC DISORDERS Depression negative neurological ROS     GI/Hepatic negative GI ROS, Neg liver ROS,   Endo/Other  Hypothyroidism Obesity  Renal/GU negative Renal ROS  negative genitourinary   Musculoskeletal negative musculoskeletal ROS (+)   Abdominal (+) + obese,   Peds  Hematology  (+) anemia ,   Anesthesia Other Findings   Reproductive/Obstetrics (+) Pregnancy                             Anesthesia Physical Anesthesia Plan  ASA: II  Anesthesia Plan: Epidural   Post-op Pain Management:    Induction:   Airway Management Planned: Natural Airway  Additional Equipment:   Intra-op Plan:   Post-operative Plan:   Informed Consent: I have reviewed the patients History and Physical, chart, labs and discussed the procedure including the risks, benefits and alternatives for the proposed anesthesia with the patient or authorized representative who has indicated his/her understanding and acceptance.     Plan Discussed with: Anesthesiologist  Anesthesia Plan Comments: (2140: C section called by Dr. Henderson Cloudomblin. Epidural has been working well. Will plan to use epidural for C/S. Discussed with patient. Questions answered.)       Anesthesia Quick Evaluation

## 2016-02-18 NOTE — Progress Notes (Signed)
Cx 2/80/-3/well applied AROM clear FHT cat one

## 2016-02-18 NOTE — Transfer of Care (Signed)
Immediate Anesthesia Transfer of Care Note  Patient: Jessica Villa  Procedure(s) Performed: Procedure(s): CESAREAN SECTION (N/A)  Patient Location: PACU  Anesthesia Type:Epidural  Level of Consciousness: awake, alert , oriented and patient cooperative  Airway & Oxygen Therapy: Patient Spontanous Breathing  Post-op Assessment: Report given to RN and Post -op Vital signs reviewed and stable  Post vital signs: Reviewed and stable  Last Vitals:  TEMP 98,6 BP 117/61 HR 74 RR 22 POX 100  Last Pain: 0     Patients Stated Pain Goal: 4 (02/18/16 0747)  Complications: No apparent anesthesia complications

## 2016-02-19 ENCOUNTER — Encounter (HOSPITAL_COMMUNITY): Payer: Self-pay | Admitting: Obstetrics and Gynecology

## 2016-02-19 LAB — CBC
HEMATOCRIT: 30.3 % — AB (ref 36.0–46.0)
Hemoglobin: 9.9 g/dL — ABNORMAL LOW (ref 12.0–15.0)
MCH: 29.4 pg (ref 26.0–34.0)
MCHC: 32.7 g/dL (ref 30.0–36.0)
MCV: 89.9 fL (ref 78.0–100.0)
PLATELETS: 159 10*3/uL (ref 150–400)
RBC: 3.37 MIL/uL — ABNORMAL LOW (ref 3.87–5.11)
RDW: 14.3 % (ref 11.5–15.5)
WBC: 11.9 10*3/uL — AB (ref 4.0–10.5)

## 2016-02-19 MED ORDER — ACETAMINOPHEN 325 MG PO TABS
650.0000 mg | ORAL_TABLET | ORAL | Status: DC | PRN
  Administered 2016-02-20 – 2016-02-21 (×2): 650 mg via ORAL
  Filled 2016-02-19 (×2): qty 2

## 2016-02-19 MED ORDER — SENNOSIDES-DOCUSATE SODIUM 8.6-50 MG PO TABS
2.0000 | ORAL_TABLET | ORAL | Status: DC
Start: 2016-02-19 — End: 2016-02-21
  Administered 2016-02-19 – 2016-02-20 (×3): 2 via ORAL
  Filled 2016-02-19 (×5): qty 2

## 2016-02-19 MED ORDER — ZOLPIDEM TARTRATE 5 MG PO TABS: 5.0000 mg | ORAL_TABLET | Freq: Every evening | ORAL | Status: DC | PRN

## 2016-02-19 MED ORDER — OXYCODONE HCL 5 MG PO TABS
5.0000 mg | ORAL_TABLET | ORAL | Status: DC | PRN
Start: 2016-02-19 — End: 2016-02-21

## 2016-02-19 MED ORDER — WITCH HAZEL-GLYCERIN EX PADS: 1.0000 "application " | MEDICATED_PAD | CUTANEOUS | Status: DC | PRN

## 2016-02-19 MED ORDER — ACETAMINOPHEN 500 MG PO TABS
1000.0000 mg | ORAL_TABLET | Freq: Four times a day (QID) | ORAL | Status: AC
  Administered 2016-02-19 (×3): 1000 mg via ORAL
  Filled 2016-02-19 (×3): qty 2

## 2016-02-19 MED ORDER — NALOXONE HCL 0.4 MG/ML IJ SOLN: 0.4000 mg | INTRAMUSCULAR | Status: DC | PRN

## 2016-02-19 MED ORDER — SIMETHICONE 80 MG PO CHEW
80.0000 mg | CHEWABLE_TABLET | Freq: Three times a day (TID) | ORAL | Status: DC
  Administered 2016-02-19 – 2016-02-20 (×5): 80 mg via ORAL
  Filled 2016-02-19 (×9): qty 1

## 2016-02-19 MED ORDER — LACTATED RINGERS IV SOLN
INTRAVENOUS | Status: DC
  Administered 2016-02-19: 10:00:00 via INTRAVENOUS

## 2016-02-19 MED ORDER — NALBUPHINE HCL 10 MG/ML IJ SOLN: 5.0000 mg | Freq: Once | INTRAMUSCULAR | Status: DC | PRN

## 2016-02-19 MED ORDER — MENTHOL 3 MG MT LOZG
1.0000 | LOZENGE | OROMUCOSAL | Status: DC | PRN
  Filled 2016-02-19: qty 9

## 2016-02-19 MED ORDER — SODIUM CHLORIDE 0.9% FLUSH: 3.0000 mL | INTRAVENOUS | Status: DC | PRN

## 2016-02-19 MED ORDER — NALBUPHINE HCL 10 MG/ML IJ SOLN: 5.0000 mg | INTRAMUSCULAR | Status: DC | PRN

## 2016-02-19 MED ORDER — TETANUS-DIPHTH-ACELL PERTUSSIS 5-2.5-18.5 LF-MCG/0.5 IM SUSP
0.5000 mL | Freq: Once | INTRAMUSCULAR | Status: DC
  Filled 2016-02-19: qty 0.5

## 2016-02-19 MED ORDER — SCOPOLAMINE 1 MG/3DAYS TD PT72: 1.0000 | MEDICATED_PATCH | Freq: Once | TRANSDERMAL | Status: DC

## 2016-02-19 MED ORDER — PRENATAL MULTIVITAMIN CH
1.0000 | ORAL_TABLET | Freq: Every day | ORAL | Status: DC
  Administered 2016-02-19 – 2016-02-20 (×2): 1 via ORAL
  Filled 2016-02-19 (×3): qty 1

## 2016-02-19 MED ORDER — COCONUT OIL OIL
1.0000 "application " | TOPICAL_OIL | Status: DC | PRN
  Administered 2016-02-20: 1 via TOPICAL
  Filled 2016-02-19 (×2): qty 120

## 2016-02-19 MED ORDER — DIPHENHYDRAMINE HCL 25 MG PO CAPS
25.0000 mg | ORAL_CAPSULE | Freq: Four times a day (QID) | ORAL | Status: DC | PRN
  Filled 2016-02-19: qty 1

## 2016-02-19 MED ORDER — NALOXONE HCL 2 MG/2ML IJ SOSY
1.0000 ug/kg/h | PREFILLED_SYRINGE | INTRAVENOUS | Status: DC | PRN
  Filled 2016-02-19: qty 2

## 2016-02-19 MED ORDER — OXYTOCIN 40 UNITS IN LACTATED RINGERS INFUSION - SIMPLE MED: 2.5000 [IU]/h | INTRAVENOUS | Status: AC

## 2016-02-19 MED ORDER — PROMETHAZINE HCL 25 MG/ML IJ SOLN
12.5000 mg | Freq: Once | INTRAMUSCULAR | Status: AC
  Administered 2016-02-19: 12.5 mg via INTRAVENOUS

## 2016-02-19 MED ORDER — DIBUCAINE 1 % RE OINT
1.0000 "application " | TOPICAL_OINTMENT | RECTAL | Status: DC | PRN
  Filled 2016-02-19: qty 28.4

## 2016-02-19 MED ORDER — SIMETHICONE 80 MG PO CHEW
80.0000 mg | CHEWABLE_TABLET | ORAL | Status: DC
  Administered 2016-02-19 – 2016-02-20 (×3): 80 mg via ORAL
  Filled 2016-02-19 (×5): qty 1

## 2016-02-19 MED ORDER — SIMETHICONE 80 MG PO CHEW
80.0000 mg | CHEWABLE_TABLET | ORAL | Status: DC | PRN
  Filled 2016-02-19: qty 1

## 2016-02-19 MED ORDER — RHO D IMMUNE GLOBULIN 1500 UNIT/2ML IJ SOSY
300.0000 ug | PREFILLED_SYRINGE | Freq: Once | INTRAMUSCULAR | Status: AC
  Administered 2016-02-19: 300 ug via INTRAMUSCULAR
  Filled 2016-02-19: qty 2

## 2016-02-19 MED ORDER — ONDANSETRON HCL 4 MG/2ML IJ SOLN: 4.0000 mg | Freq: Three times a day (TID) | INTRAMUSCULAR | Status: DC | PRN

## 2016-02-19 MED ORDER — OXYCODONE HCL 5 MG PO TABS: 10.0000 mg | ORAL_TABLET | ORAL | Status: DC | PRN

## 2016-02-19 MED ORDER — IBUPROFEN 600 MG PO TABS
600.0000 mg | ORAL_TABLET | Freq: Four times a day (QID) | ORAL | Status: DC
  Administered 2016-02-19 – 2016-02-21 (×8): 600 mg via ORAL
  Filled 2016-02-19 (×8): qty 1

## 2016-02-19 NOTE — Lactation Note (Signed)
This note was copied from a baby's chart. Lactation Consultation Note  Patient Name: [name] [id number]'s Date: 02/19/2016 Reason for consult: Initial assessment (baby now awake and hungry , )  Baby is 616 hours old and has been to the breast for a few latches .  Has had 2 stools , 2 wets , and at consult large wet .  Baby sleepy at 1st , and awake enough for spoon feeding 1 ml of EBM  That was easily hand expressed off both breast. ( LC demo for parents)  1st latch right breast - football - on and off pattern with some swallows,  2nd latch left breast - laid back position , on and off at 1st and then latched for  About 8 mins with multiply swallows, increased w/ breast compressions.  Per mom comfortable. Nipple well rounded when baby  Released.  Due to excessive edema in moms feet and ankles , some areola edema , breast shells  Indicated. LC reviewed basics, stressed the importance of skin to skin feedings and in between ,  Hand expressing before latch , in between , assistance  with latch until the baby is consistent .  LC showed dad how he could help.  Mother informed of post-discharge support and given phone number to the lactation department,  including services for phone call assistance; out-patient appointments; and breastfeeding support group.  List of other breastfeeding resources in the community given in the handout. Encouraged mother to call for problems or concerns related to breastfeeding.   Maternal Data Has patient been taught Hand Expression?: Yes Does the patient have breastfeeding experience prior to this delivery?: No  Feeding Feeding Type: Breast Fed (laid back right breast ) Length of feed: 8 min (multiply swallows/ increased with breast compressions )  LATCH Score/Interventions Latch: Repeated attempts needed to sustain latch, nipple held in mouth throughout feeding, stimulation needed to elicit sucking reflex. Intervention(s): Adjust position;Assist with  latch;Breast massage;Breast compression  Audible Swallowing: Spontaneous and intermittent  Type of Nipple: Everted at rest and after stimulation  Comfort (Breast/Nipple): Soft / non-tender     Hold (Positioning): Assistance needed to correctly position infant at breast and maintain latch. Intervention(s): Breastfeeding basics reviewed;Support Pillows;Position options;Skin to skin  LATCH Score: 8  Lactation Tools Discussed/Used Tools: Shells Shell Type: Inverted WIC Program: No   Consult Status Consult Status: Follow-up Date: 02/20/16 Follow-up type: In-patient    Jessica Villa, Jessica Villa 02/19/2016, 2:45 PM

## 2016-02-19 NOTE — Anesthesia Postprocedure Evaluation (Signed)
Anesthesia Post Note  Patient: Darrol AngelMelanie Pree  Procedure(s) Performed: Procedure(s) (LRB): CESAREAN SECTION (N/A)  Patient location during evaluation: Mother Baby Anesthesia Type: Epidural Level of consciousness: awake, awake and alert and oriented Pain management: pain level controlled Vital Signs Assessment: post-procedure vital signs reviewed and stable Respiratory status: spontaneous breathing, nonlabored ventilation and respiratory function stable Cardiovascular status: stable Postop Assessment: no headache, no backache, patient able to bend at knees, no signs of nausea or vomiting and adequate PO intake Anesthetic complications: no     Last Vitals:  Filed Vitals:   02/19/16 0417 02/19/16 0515  BP: 124/56 121/71  Pulse: 57 77  Temp: 37 C 36.6 C  Resp: 17 18    Last Pain:  Filed Vitals:   02/19/16 0533  PainSc: 0-No pain   Pain Goal: Patients Stated Pain Goal: 4 (02/18/16 0747)               Truitt LeepHYMER,Kamoria Lucien

## 2016-02-19 NOTE — Progress Notes (Signed)
Subjective: Postpartum Day 1: Cesarean Delivery Patient reports tolerating PO.  Some nausea last pm, feels hungry this am  Objective: Vital signs in last 24 hours: Temp:  [97.6 F (36.4 C)-99.4 F (37.4 C)] 97.9 F (36.6 C) (06/01 0515) Pulse Rate:  [48-124] 77 (06/01 0515) Resp:  [14-23] 18 (06/01 0515) BP: (99-140)/(48-85) 121/71 mmHg (06/01 0515) SpO2:  [91 %-100 %] 94 % (06/01 0515)  Physical Exam:  General: cooperative and drowsy Lochia: appropriate Uterine Fundus: firm Incision: abd dressing CDI DVT Evaluation: No evidence of DVT seen on physical exam. Negative Homan's sign. No cords or calf tenderness. Calf/Ankle edema is present. Lungs CTA ABd soft , decreased BS  Recent Labs  02/18/16 0210 02/19/16 0628  HGB 11.4* 9.9*  HCT 33.6* 30.3*    Assessment/Plan: Status post Cesarean section. Doing well postoperatively.  Continue current care.  Christena Sunderlin G 02/19/2016, 8:30 AM

## 2016-02-19 NOTE — Progress Notes (Signed)
[name] was referred for history of depression/anxiety. * Referral screened out by Clinical Social Worker because none of the following criteria appear to apply: ~ History of anxiety/depression during this pregnancy, or of post-partum depression. ~ Diagnosis of anxiety and/or depression within last 3 years OR * [name]'s symptoms currently being treated with medication and/or therapy. Please contact the Clinical Social Worker if needs arise, or if [name] requests.  [name] has rx for Celexa. 

## 2016-02-19 NOTE — Anesthesia Postprocedure Evaluation (Signed)
Anesthesia Post Note  Patient: Jessica Villa  Procedure(s) Performed: Procedure(s) (LRB): CESAREAN SECTION (N/A)  Patient location during evaluation: PACU Anesthesia Type: Epidural Level of consciousness: oriented and awake and alert Pain management: pain level controlled Vital Signs Assessment: post-procedure vital signs reviewed and stable Respiratory status: spontaneous breathing, respiratory function stable and patient connected to nasal cannula oxygen Cardiovascular status: blood pressure returned to baseline and stable Postop Assessment: no headache, no backache, epidural receding and patient able to bend at knees Anesthetic complications: no Comments: To floor on ETCO2 monitoring. She required oxygen in the PACU after phenergan.     Last Vitals:  Filed Vitals:   02/19/16 0100 02/19/16 0112  BP:    Pulse: 62 68  Temp:    Resp: 20 16    Last Pain:  Filed Vitals:   02/19/16 0112  PainSc: Asleep   Pain Goal: Patients Stated Pain Goal: 4 (02/18/16 0747)               Sherrian DiversENENNY,Alfonza Toft J

## 2016-02-19 NOTE — Op Note (Signed)
Jessica Villa:  Arp, Kiya               ACCOUNT NO.:  [id number]  MEDICAL RECORD NO.:  [id number]  LOCATION:  9104                          FACILITY:  [facility]  PHYSICIAN:  Guy SandiferJames E. Henderson Cloudomblin, M.D. DATE OF BIRTH:  [birth year]  DATE OF PROCEDURE:  02/18/2016 DATE OF DISCHARGE:                              OPERATIVE REPORT   PREOPERATIVE DIAGNOSIS:  Fetal intolerance to labor.  POSTOPERATIVE DIAGNOSES:  Fetal intolerance of labor.  PROCEDURE:  Primary low-transverse cesarean section.  SURGEON:  Guy SandiferJames E. Henderson Cloudomblin, M.D.  ANESTHESIA:  Epidural.  FINDINGS:  Viable female infant, Apgars of 4, 7 and 8 at 1, 5 and 10 minutes respectively.  SPECIMENS:  Placenta to Pathology.  ESTIMATED BLOOD LOSS:  800 mL.  INDICATIONS AND CONSENT:  This patient is a 35 year old, G2, P0 at 7440- 2/7th weeks, who was admitted last evening for a two-stage induction of labor.  She progresses to 4 cm dilation, complete effacement and -1 station.  She has two episodes of prolonged deceleration in the 60s, lasting approximately 45 minutes.  Both resolved with position change, oxygen and fluids.  Second required knee-chest position.  The patient still had no change of her cervix.  Cesarean section was recommended. Potential risks and complications reviewed preoperatively including, but not limited to, infection, organ damage, bleeding requiring transfusion of blood products with HIV and hepatitis acquisition, DVT, PE, pneumonia.  The patient states she understands, all questions were answered and consent was signed on the chart.  DESCRIPTION OF PROCEDURE:  The patient was taken to the operating room where she was identified, her epidural anesthetic was augmented to the surgical level and she was placed in the dorsal supine position with 15- degree left lateral wedge.  Foley catheter was already in place.  Time- out was undertaken and she was prepped and draped in a sterile fashion. Testing for adequate epidural  anesthesia, she still had some sensation on the left lower quadrant.  She was given additional medication by the anesthesiologist and 20 mL of 0.25% plain Marcaine was injected subcutaneously along the area for the Pfannenstiel incision. Pfannenstiel incision was then made.  Dissection was carried out to the peritoneum.  The patient remained comfortable.  Peritoneum was incised and extended superiorly and inferiorly.  Vesicouterine peritoneum was taken down cephalad laterally.  The uterus was then incised in a low- transverse manner and the uterine cavity was entered bluntly with a hemostat.  The uterine incision was extended cephalad laterally with fingers.  Baby was then delivered from the vertex position.  Loose nuchal cord across the shoulders was noted.  Baby was delivered by using a mushroom vacuum extractor to elevate the vertex through the incision. No pop-offs and gentle traction was used.  Cry and tone were noted. Cord was clamped and cut and the baby was handed to the awaiting Pediatrics team.  Placenta was manually delivered.  Uterine cavity was clean.  It was massaged and IV Pitocin was given.  Uterus was then closed in a running, locking fashion with two imbricating layers of 0 Monocryl suture, which achieved good hemostasis.  Lavage and inspection were carried out and good hemostasis was noted.  Anterior peritoneum was then closed  in running fashion with 0 Monocryl suture, which was also used to reapproximate the pyramidalis muscle in the midline.  Anterior rectus fascia was closed in a running fashion with a 0 looped PDS. Subcutaneous layer was closed with interrupted plain sutures and the skin was closed in a subcuticular fashion with 4-0 Vicryl on a Keith needle.  Benzoin, Steri-Strips and pressure dressing were applied.  All counts were correct.  The patient was taken to the recovery room in stable condition.     Guy Sandifer Henderson Cloud, M.D.     JET/MEDQ  D:   02/18/2016  T:  02/19/2016  Job:  [id number]

## 2016-02-20 LAB — RH IG WORKUP (INCLUDES ABO/RH)
ABO/RH(D): O NEG
FETAL SCREEN: NEGATIVE
GESTATIONAL AGE(WKS): 40
Unit division: 0

## 2016-02-20 MED ORDER — LEVOTHYROXINE SODIUM 175 MCG PO TABS
175.0000 ug | ORAL_TABLET | Freq: Every day | ORAL | Status: DC
  Administered 2016-02-20 – 2016-02-21 (×2): 175 ug via ORAL
  Filled 2016-02-20 (×2): qty 1

## 2016-02-20 MED ORDER — CITALOPRAM HYDROBROMIDE 40 MG PO TABS
40.0000 mg | ORAL_TABLET | Freq: Every day | ORAL | Status: DC
  Administered 2016-02-20: 40 mg via ORAL
  Filled 2016-02-20 (×2): qty 1

## 2016-02-20 NOTE — Lactation Note (Signed)
This note was copied from a baby's chart. Lactation Consultation Note  Although mother's nipples are sore she has greatly improved with her confidence w/ breastfeeding. Baby is an active breastfeeder.  Mother easily can hand express colostrum. Mother nipples continue to be sore from breastfeeding.  Slightly pink but no cracks or abrasions. Oral assessment indicated mid posterior short labial frenulum causing some cupping of the tongue and thick labial frenulum. Recommend they speak with Peds MD. Upper lip tight and needs some assistance w/ flanging. Baby doing some biting but did note that baby was able to protrude his tongue outside of mouth. Mother initially latched baby in football hold. When she unlatched him her nipple was pinched. Repositioned baby to laid back position which mother states she prefers and is more comfortable. Encouraged mother to pay attention to depth. Recommend applying ebm to nipple along w/ coconut oil.   Made OP appt for 6/12 at 12p.  Gave parents resource sheets for frenulum.   Patient Name: Boy Darrol AngelMelanie Kohan ZOXWR'UToday's Date: 02/20/2016 Reason for consult: Follow-up assessment   Maternal Data    Feeding Feeding Type: Breast Fed Length of feed: 45 min  LATCH Score/Interventions Latch: Grasps breast easily, tongue down, lips flanged, rhythmical sucking. Intervention(s): Adjust position;Assist with latch;Breast massage;Breast compression  Audible Swallowing: Spontaneous and intermittent Intervention(s): Skin to skin;Alternate breast massage  Type of Nipple: Everted at rest and after stimulation  Comfort (Breast/Nipple): Filling, red/small blisters or bruises, mild/mod discomfort  Problem noted: Mild/Moderate discomfort Interventions (Mild/moderate discomfort): Hand expression  Hold (Positioning): Assistance needed to correctly position infant at breast and maintain latch.  LATCH Score: 8  Lactation Tools Discussed/Used     Consult  Status Consult Status: Follow-up Date: 02/21/16 Follow-up type: In-patient    Dahlia ByesBerkelhammer, [facility]oschen 02/20/2016, 3:48 PM

## 2016-02-20 NOTE — Progress Notes (Signed)
Subjective: Postpartum Day 2: Cesarean Delivery Patient reports incisional pain, tolerating PO, + flatus and no problems voiding.    Objective: Vital signs in last 24 hours: Temp:  [98 F (36.7 C)-99.8 F (37.7 C)] 98 F (36.7 C) (06/02 0555) Pulse Rate:  [58-78] 70 (06/02 0555) Resp:  [17-20] 20 (06/02 0555) BP: (97-139)/(48-71) 105/71 mmHg (06/02 0555) SpO2:  [95 %-99 %] 98 % (06/02 0555)  Physical Exam:  General: cooperative and continues to express some confusion and difficulty with memory Lochia: appropriate Uterine Fundus: firm Incision: healing well, honeycomb dressing saturated , dressing removed and replaced, no active bleeding noted DVT Evaluation: No evidence of DVT seen on physical exam. Negative Homan's sign. No cords or calf tenderness. Calf/Ankle edema is present.   Recent Labs  02/18/16 0210 02/19/16 0628  HGB 11.4* 9.9*  HCT 33.6* 30.3*    Assessment/Plan: Status post Cesarean section. Postoperative course complicated by confusion, perhaps related to  Scopolamine patch  Trans derm scop patch removed, observe Restarted Synthroid.  CURTIS,CAROL G 02/20/2016, 8:05 AM

## 2016-02-21 ENCOUNTER — Ambulatory Visit: Payer: Self-pay

## 2016-02-21 MED ORDER — IBUPROFEN 600 MG PO TABS: 600.0000 mg | ORAL_TABLET | Freq: Four times a day (QID) | ORAL | Status: DC

## 2016-02-21 MED ORDER — OXYCODONE HCL 10 MG PO TABS: 10.0000 mg | ORAL_TABLET | ORAL | Status: DC | PRN

## 2016-02-21 NOTE — Lactation Note (Signed)
This note was copied from a baby's chart. Lactation Consultation Note  Mother continues to BF but is having pain of a 7-8 on the pain scale when she BF.  Tried to position baby in a FB hold but pain did not decrease. Agree with previous oral assessment.  Introduced a #24 and #20 NS to the right breast but pain did not decrease.  However on the left breast breastfeeding became much more comfortable.  Plan is for mom to BF when baby is hungry. If it is too painful she is to pump and cup feed. Also encouraged post-pumping 6 times in 24 hours related to nipple shield use.  Any expressed amounts can be offered the baby. She has an outpatient appointment scheduled for 02/27/2016 and knows to call sooner if needed. Also encouraged support groups.  Patient Name: Jessica Darrol AngelMelanie Villa ZOXWR'UToday's Date: 02/21/2016     Maternal Data    Feeding    LATCH Score/Interventions Latch: Repeated attempts needed to sustain latch, nipple held in mouth throughout feeding, stimulation needed to elicit sucking reflex.  Audible Swallowing: A few with stimulation  Type of Nipple: Everted at rest and after stimulation  Comfort (Breast/Nipple): Engorged, cracked, bleeding, large blisters, severe discomfort     Hold (Positioning): Assistance needed to correctly position infant at breast and maintain latch.  LATCH Score: 5  Lactation Tools Discussed/Used     Consult Status Consult Status: Follow-up Date: 02/27/16 Follow-up type: In-patient    Jessica DryerJoseph, Jessica Villa 02/21/2016, 4:17 PM

## 2016-02-21 NOTE — Discharge Summary (Signed)
Obstetric Discharge Summary Reason for Admission: induction of labor Prenatal Procedures: none Intrapartum Procedures: cesarean: low cervical, transverse Postpartum Procedures: none Complications-Operative and Postpartum: none HEMOGLOBIN  Date Value Ref Range Status  02/19/2016 9.9* 12.0 - 15.0 g/dL Final   HCT  Date Value Ref Range Status  02/19/2016 30.3* 36.0 - 46.0 % Final    Physical Exam:  General: alert, cooperative, appears stated age and no distress Lochia: appropriate Uterine Fundus: firm Incision: healing well DVT Evaluation: No evidence of DVT seen on physical exam.  Discharge Diagnoses: Term Pregnancy-delivered  Discharge Information: Date: 02/21/2016 Activity: pelvic rest Diet: routine Medications: Ibuprofen and Percocet Condition: stable Instructions: refer to practice specific booklet Discharge to: home   Newborn Data: Live born female  Birth Weight: 7 lb 15 oz (3600 g) APGAR: 4, 6  Home with mother.  Norlene Lanes C 02/21/2016, 7:29 AM

## 2016-02-23 ENCOUNTER — Ambulatory Visit (HOSPITAL_COMMUNITY): Payer: BLUE CROSS/BLUE SHIELD

## 2016-02-27 ENCOUNTER — Ambulatory Visit (HOSPITAL_COMMUNITY)
Admission: RE | Admit: 2016-02-27 | Payer: BLUE CROSS/BLUE SHIELD | Source: Ambulatory Visit | Admitting: Obstetrics & Gynecology

## 2016-03-01 ENCOUNTER — Ambulatory Visit (HOSPITAL_COMMUNITY): Payer: BLUE CROSS/BLUE SHIELD

## 2016-04-01 DIAGNOSIS — E039 Hypothyroidism, unspecified: Secondary | ICD-10-CM | POA: Diagnosis not present

## 2016-04-01 DIAGNOSIS — Z1389 Encounter for screening for other disorder: Secondary | ICD-10-CM | POA: Diagnosis not present

## 2016-06-09 DIAGNOSIS — E039 Hypothyroidism, unspecified: Secondary | ICD-10-CM | POA: Diagnosis not present

## 2016-06-11 ENCOUNTER — Other Ambulatory Visit: Payer: Self-pay | Admitting: Family

## 2016-06-11 NOTE — Telephone Encounter (Signed)
90 day supply of Citalopram sent to pharmacy. Pt last seen by PCP 11/2015 for acute illness.  Please advise when pt should f/u in the office?

## 2016-06-12 NOTE — Telephone Encounter (Signed)
Pt is due for follow up please.  

## 2016-06-18 NOTE — Telephone Encounter (Signed)
My chart message sent to pt.

## 2016-07-14 ENCOUNTER — Encounter: Payer: Self-pay | Admitting: Family

## 2016-07-14 ENCOUNTER — Ambulatory Visit (INDEPENDENT_AMBULATORY_CARE_PROVIDER_SITE_OTHER): Payer: BLUE CROSS/BLUE SHIELD | Admitting: Family

## 2016-07-14 DIAGNOSIS — F32A Depression, unspecified: Secondary | ICD-10-CM

## 2016-07-14 DIAGNOSIS — F419 Anxiety disorder, unspecified: Principal | ICD-10-CM

## 2016-07-14 DIAGNOSIS — E039 Hypothyroidism, unspecified: Secondary | ICD-10-CM

## 2016-07-14 DIAGNOSIS — F418 Other specified anxiety disorders: Secondary | ICD-10-CM | POA: Diagnosis not present

## 2016-07-14 DIAGNOSIS — Z23 Encounter for immunization: Secondary | ICD-10-CM

## 2016-07-14 DIAGNOSIS — F329 Major depressive disorder, single episode, unspecified: Secondary | ICD-10-CM

## 2016-07-14 MED ORDER — CITALOPRAM HYDROBROMIDE 40 MG PO TABS: 40.0000 mg | ORAL_TABLET | Freq: Every day | ORAL | 0 refills | Status: DC

## 2016-07-14 MED ORDER — NYSTATIN-TRIAMCINOLONE 100000-0.1 UNIT/GM-% EX CREA: TOPICAL_CREAM | CUTANEOUS | 1 refills | Status: DC

## 2016-07-14 NOTE — Assessment & Plan Note (Signed)
Stable on citalopram, continue same.  

## 2016-07-14 NOTE — Patient Instructions (Signed)
You may use mycolog cream on the rash. Keep your upcoming appointment with dermatology if your rash does not improve.  Continue citalopram and synthroid.

## 2016-07-14 NOTE — Assessment & Plan Note (Signed)
Stable on synthroid, continue same. GYN is currently refilling.

## 2016-07-14 NOTE — Progress Notes (Signed)
Subjective:    Patient ID: Jessica Villa, female    DOB: [birth year], 35 y.o.   MRN: [id number]  HPI  [name] is a 35 yr old female who presents today for follow up. She had a baby on 02/18/16.   Depression- she is maintained on citalopram 40mg  once daily.  Hypothyroid- clinically stable. Reports that GYN recently checked her thyroid test and was normal.  Lab Results  Component Value Date   TSH 0.88 04/11/2015    Review of Systems See HPI  Past Medical History:  Diagnosis Date  . Anemia   . Depression   . Eczema   . History of ovarian cyst   . Hx of varicella   . Hypothyroidism   . Thyroid disease    hypothyroid     Social History   Social History  . Marital status: Married    Spouse name: N/A  . Number of children: N/A  . Years of education: N/A   Occupational History  . Not on file.   Social History Main Topics  . Smoking status: Never Smoker  . Smokeless tobacco: Never Used  . Alcohol use Yes     Comment: 1 cocktail or wine per month none since pregnancy  . Drug use: No  . Sexual activity: Yes   Other Topics Concern  . Not on file   Social History Narrative   2 step children ( 74 year and 80 year old) lives with pt and her husband.    Stay at home mom   Has masters degree in education leadership   Has worked as a Dance movement psychotherapist, crocheting, no pets    Past Surgical History:  Procedure Laterality Date  . CESAREAN SECTION N/A 02/18/2016   Procedure: CESAREAN SECTION;  Surgeon: Harold Hedge, MD;  Location: [facility] BIRTHING SUITES;  Service: Obstetrics;  Laterality: N/A;  . NO PAST SURGERIES      Family History  Problem Relation Age of Onset  . Depression Mother   . Migraines Mother   . Dementia Father   . Diabetes Father   . Depression Father   . Hypothyroidism Father   . Alcohol abuse Maternal Grandmother   . Alcohol abuse Maternal Grandfather   . Hypothyroidism Paternal Grandmother   . Heart disease Paternal Grandfather     . Diabetes Paternal Grandfather   . Skin cancer Paternal Grandfather     No Known Allergies  Current Outpatient Prescriptions on File Prior to Visit  Medication Sig Dispense Refill  . citalopram (CELEXA) 40 MG tablet TAKE ONE TABLET BY MOUTH ONE TIME DAILY 90 tablet 0  . levothyroxine (SYNTHROID, LEVOTHROID) 175 MCG tablet 150 mcg.   1  . nystatin-triamcinolone (MYCOLOG II) cream APPLY TWO TIMES DAILY AS NEEDED  5   No current facility-administered medications on file prior to visit.     BP 127/60 (BP Location: Right Arm, Patient Position: Sitting, Cuff Size: Normal)   Pulse 93   Temp 97.7 F (36.5 C) (Oral)   Resp 16   Ht 5\' 6"  (1.676 m)   Wt 212 lb 3.2 oz (96.3 kg)   LMP 04/06/2016   SpO2 100% Comment: room air  BMI 34.25 kg/m       Objective:   Physical Exam  Constitutional: She is oriented to person, place, and time. She appears well-developed and well-nourished.  Neck: No thyromegaly present.  Cardiovascular: Normal rate, regular rhythm and normal heart sounds.   No murmur heard.  Pulmonary/Chest: Effort normal and breath sounds normal. No respiratory distress. She has no wheezes.  Neurological: She is alert and oriented to person, place, and time.  Psychiatric: She has a normal mood and affect. Her behavior is normal. Judgment and thought content normal.          Assessment & Plan:  Flu shot today.

## 2016-07-14 NOTE — Progress Notes (Signed)
Pre visit review using our clinic review tool, if applicable. No additional management support is needed unless otherwise documented below in the visit note. 

## 2016-08-10 DIAGNOSIS — L2089 Other atopic dermatitis: Secondary | ICD-10-CM | POA: Diagnosis not present

## 2016-08-10 DIAGNOSIS — B356 Tinea cruris: Secondary | ICD-10-CM | POA: Diagnosis not present

## 2016-08-23 ENCOUNTER — Ambulatory Visit (INDEPENDENT_AMBULATORY_CARE_PROVIDER_SITE_OTHER): Payer: BLUE CROSS/BLUE SHIELD | Admitting: Family

## 2016-08-23 ENCOUNTER — Encounter: Payer: Self-pay | Admitting: Family

## 2016-08-23 VITALS — BP 118/75 | HR 67 | Temp 98.5°F | Resp 16 | Ht 66.0 in | Wt 208.2 lb

## 2016-08-23 DIAGNOSIS — Z0000 Encounter for general adult medical examination without abnormal findings: Secondary | ICD-10-CM | POA: Diagnosis not present

## 2016-08-23 LAB — LIPID PANEL
CHOLESTEROL: 154 mg/dL (ref 0–200)
HDL: 49.6 mg/dL (ref >39.00)
LDL CALC: 99 mg/dL (ref 0–99)
NonHDL: 104.39
Total CHOL/HDL Ratio: 3
Triglycerides: 28 mg/dL (ref 0.0–149.0)
VLDL: 5.6 mg/dL (ref 0.0–40.0)

## 2016-08-23 LAB — BASIC METABOLIC PANEL
BUN: 17 mg/dL (ref 6–23)
CALCIUM: 9.4 mg/dL (ref 8.4–10.5)
CO2: 27 meq/L (ref 19–32)
Chloride: 104 mEq/L (ref 96–112)
Creatinine, Ser: 0.79 mg/dL (ref 0.40–1.20)
GFR: 86.66 mL/min (ref >60.00)
GLUCOSE: 89 mg/dL (ref 70–99)
Potassium: 4 mEq/L (ref 3.5–5.1)
Sodium: 138 mEq/L (ref 135–145)

## 2016-08-23 LAB — HEPATIC FUNCTION PANEL
ALBUMIN: 4.1 g/dL (ref 3.5–5.2)
ALT: 15 U/L (ref 0–35)
AST: 23 U/L (ref 0–37)
Alkaline Phosphatase: 83 U/L (ref 39–117)
BILIRUBIN TOTAL: 0.6 mg/dL (ref 0.2–1.2)
Bilirubin, Direct: 0.1 mg/dL (ref 0.0–0.3)
Total Protein: 7.2 g/dL (ref 6.0–8.3)

## 2016-08-23 LAB — URINALYSIS, ROUTINE W REFLEX MICROSCOPIC
BILIRUBIN URINE: NEGATIVE
HGB URINE DIPSTICK: NEGATIVE
Ketones, ur: NEGATIVE
NITRITE: NEGATIVE
RBC / HPF: NONE SEEN (ref 0–?)
Specific Gravity, Urine: 1.01 (ref 1.000–1.030)
Total Protein, Urine: NEGATIVE
Urine Glucose: NEGATIVE
Urobilinogen, UA: 0.2 (ref 0.0–1.0)
pH: 7 (ref 5.0–8.0)

## 2016-08-23 LAB — CBC WITH DIFFERENTIAL/PLATELET
BASOS ABS: 0 10*3/uL (ref 0.0–0.1)
Basophils Relative: 0.6 % (ref 0.0–3.0)
EOS PCT: 3.4 % (ref 0.0–5.0)
Eosinophils Absolute: 0.3 10*3/uL (ref 0.0–0.7)
HCT: 38.2 % (ref 36.0–46.0)
Hemoglobin: 12.8 g/dL (ref 12.0–15.0)
LYMPHS ABS: 2.4 10*3/uL (ref 0.7–4.0)
Lymphocytes Relative: 29.2 % (ref 12.0–46.0)
MCHC: 33.4 g/dL (ref 30.0–36.0)
MCV: 86.8 fl (ref 78.0–100.0)
MONO ABS: 0.7 10*3/uL (ref 0.1–1.0)
MONOS PCT: 9.2 % (ref 3.0–12.0)
NEUTROS ABS: 4.7 10*3/uL (ref 1.4–7.7)
NEUTROS PCT: 57.6 % (ref 43.0–77.0)
PLATELETS: 264 10*3/uL (ref 150.0–400.0)
RBC: 4.4 Mil/uL (ref 3.87–5.11)
RDW: 13.6 % (ref 11.5–15.5)
WBC: 8.1 10*3/uL (ref 4.0–10.5)

## 2016-08-23 LAB — TSH: TSH: 4.16 u[IU]/mL (ref 0.35–4.50)

## 2016-08-23 NOTE — Progress Notes (Signed)
Pre visit review using our clinic review tool, if applicable. No additional management support is needed unless otherwise documented below in the visit note. 

## 2016-08-23 NOTE — Assessment & Plan Note (Addendum)
Discussed healthy diet, exercise and weight loss. Obtain routine labs. Pap and immunizations up to date.

## 2016-08-23 NOTE — Patient Instructions (Signed)
Please complete lab work prior to leaving. Try to add 30 minutes of exercise 5 days a week.  Continue to work on [employer] diet and exercise.

## 2016-08-23 NOTE — Progress Notes (Signed)
Subjective:    Patient ID: Jessica Villa, female    DOB: 08/09/1981, 35 y.o.   MRN: [id number]  HPI  Patient presents today for complete physical.  Immunizations: up to date Diet: fair diet, (nursing) Exercise: only exercising once a week Pap smear:   10/22/14 Pre-pregnancy weight about 200.   Wt Readings from Last 3 Encounters:  08/23/16 208 lb 3.2 oz (94.4 kg)  07/14/16 212 lb 3.2 oz (96.3 kg)  02/18/16 243 lb (110.2 kg)   Night sweats- occurs about twice a week.     Review of Systems  Constitutional: Negative for unexpected weight change.  HENT: Negative for rhinorrhea.   Respiratory: Negative for cough.   Cardiovascular: Negative for leg swelling.  Gastrointestinal: Negative for constipation and diarrhea.  Genitourinary: Negative for dysuria and frequency.       One period since her son was born  Musculoskeletal: Negative for arthralgias and myalgias.  Skin:       Eczema on hands- followed by dermatology   Neurological: Negative for headaches.  Hematological: Negative for adenopathy.  Psychiatric/Behavioral:       Denies depression/anxiety       Past Medical History:  Diagnosis Date  . Anemia   . Depression   . Eczema   . History of ovarian cyst   . Hx of varicella   . Hypothyroidism   . Thyroid disease    hypothyroid     Social History   Social History  . Marital status: Married    Spouse name: N/A  . Number of children: N/A  . Years of education: N/A   Occupational History  . Not on file.   Social History Main Topics  . Smoking status: Never Smoker  . Smokeless tobacco: Never Used  . Alcohol use Yes     Comment: 1 cocktail or wine per month none since pregnancy  . Drug use: No  . Sexual activity: Yes   Other Topics Concern  . Not on file   Social History Narrative   2 step children ( 744 year and 512 year old) lives with pt and her husband.    Stay at home mom   Has masters degree in education leadership   Has worked as a Dance movement psychotherapistteacher   Enjoys hiking   Crafts, crocheting, no pets    Past Surgical History:  Procedure Laterality Date  . CESAREAN SECTION N/A 02/18/2016   Procedure: CESAREAN SECTION;  Surgeon: Harold HedgeJames Tomblin, MD;  [facility];  Service: Obstetrics;  Laterality: N/A;  . NO PAST SURGERIES      Family History  Problem Relation Age of Onset  . Depression Mother   . Migraines Mother   . Dementia Father   . Diabetes Father   . Depression Father   . Hypothyroidism Father   . Alcohol abuse Maternal Grandmother   . Alcohol abuse Maternal Grandfather   . Hypothyroidism Paternal Grandmother   . Heart disease Paternal Grandfather   . Diabetes Paternal Grandfather   . Skin cancer Paternal Grandfather     No Known Allergies  Current Outpatient Prescriptions on File Prior to Visit  Medication Sig Dispense Refill  . citalopram (CELEXA) 40 MG tablet Take 1 tablet (40 mg total) by mouth daily. 90 tablet 0  . HEATHER 0.35 MG tablet Take 1 tablet by mouth daily.  12   No current facility-administered medications on file prior to visit.     BP 118/75 (BP Location: Right Arm, Cuff Size: Normal)  Pulse 67   Temp 98.5 F (36.9 C) (Oral)   Resp 16   Ht 5\' 6"  (1.676 m)   Wt 208 lb 3.2 oz (94.4 kg)   LMP 04/14/2016   SpO2 100%   Breastfeeding? Yes   BMI 33.60 kg/m    Objective:   Physical Exam  Physical Exam  Constitutional: She is oriented to person, place, and time. She appears well-developed and well-nourished. No distress.  HENT:  Head: Normocephalic and atraumatic.  Right Ear: Tympanic membrane and ear canal normal.  Left Ear: Tympanic membrane and ear canal normal.  Mouth/Throat: Oropharynx is clear and moist.  Eyes: Pupils are equal, round, and reactive to light. No scleral icterus.  Neck: Normal range of motion. No thyromegaly present.  Cardiovascular: Normal rate and regular rhythm.   No murmur heard. Pulmonary/Chest: Effort normal and breath sounds normal. No respiratory  distress. He has no wheezes. She has no rales. She exhibits no tenderness.  Abdominal: Soft. Bowel sounds are normal. She exhibits no distension and no mass. There is no tenderness. There is no rebound and no guarding.  Musculoskeletal: She exhibits no edema.  Lymphadenopathy:    She has no cervical adenopathy.  Neurological: She is alert and oriented to person, place, and time. She has normal patellar reflexes. She exhibits normal muscle tone. Coordination normal.  Skin: Skin is warm and dry. dry eczema noted on bilateral hands Psychiatric: She has a normal mood and affect. Her behavior is normal. Judgment and thought content normal.  Breast/pelvis: deferred       Assessment & Plan:         Assessment & Plan:

## 2016-08-24 ENCOUNTER — Encounter: Payer: Self-pay | Admitting: Family

## 2016-11-15 ENCOUNTER — Telehealth: Payer: Self-pay | Admitting: Family

## 2016-11-15 MED ORDER — OSELTAMIVIR PHOSPHATE 75 MG PO CAPS: 75.0000 mg | ORAL_CAPSULE | Freq: Every day | ORAL | 0 refills | Status: DC

## 2016-11-15 NOTE — Telephone Encounter (Signed)
Husband has flu. Patient would like prophylaxis.  She is breast feeding, but I think benefit outweighs risk.  She will contact her pediatrician and let them know that she is starting tamiflu and see if they also want to prophylax her infant.

## 2016-12-03 ENCOUNTER — Other Ambulatory Visit: Payer: Self-pay

## 2016-12-03 MED ORDER — CITALOPRAM HYDROBROMIDE 40 MG PO TABS: 40.0000 mg | ORAL_TABLET | Freq: Every day | ORAL | 0 refills | Status: DC

## 2017-02-09 ENCOUNTER — Encounter: Payer: Self-pay | Admitting: Family

## 2017-02-10 MED ORDER — FLUOCINONIDE 0.05 % EX OINT: TOPICAL_OINTMENT | CUTANEOUS | 2 refills | Status: DC

## 2017-03-05 ENCOUNTER — Other Ambulatory Visit: Payer: Self-pay | Admitting: Family

## 2017-03-07 NOTE — Telephone Encounter (Signed)
Refill sent per [facility] refill protocol/SLS  

## 2017-06-01 ENCOUNTER — Other Ambulatory Visit: Payer: Self-pay | Admitting: Family

## 2017-06-02 NOTE — Telephone Encounter (Signed)
Faxed Rx/thx dmf 

## 2017-06-02 NOTE — Telephone Encounter (Signed)
LMOVM stating that TF sent MyChart message on 9.12.18 and to please respond with that answer so the provider will be able to proceed with refill request/thx dmf

## 2017-08-22 ENCOUNTER — Encounter: Payer: Self-pay | Admitting: Family

## 2017-08-22 ENCOUNTER — Ambulatory Visit: Payer: PRIVATE HEALTH INSURANCE | Admitting: Family

## 2017-08-22 ENCOUNTER — Ambulatory Visit (HOSPITAL_BASED_OUTPATIENT_CLINIC_OR_DEPARTMENT_OTHER)
Admission: RE | Admit: 2017-08-22 | Discharge: 2017-08-22 | Disposition: A | Discharge status: Home / Self Care | Payer: PRIVATE HEALTH INSURANCE | Source: Ambulatory Visit | Attending: Family | Admitting: Family

## 2017-08-22 VITALS — BP 107/64 | HR 90 | Temp 98.5°F | Resp 16 | Ht 66.0 in | Wt 212.0 lb

## 2017-08-22 DIAGNOSIS — J019 Acute sinusitis, unspecified: Secondary | ICD-10-CM

## 2017-08-22 DIAGNOSIS — R05 Cough: Secondary | ICD-10-CM

## 2017-08-22 DIAGNOSIS — R059 Cough, unspecified: Secondary | ICD-10-CM

## 2017-08-22 MED ORDER — AMOXICILLIN-POT CLAVULANATE 875-125 MG PO TABS: 1.0000 | ORAL_TABLET | Freq: Two times a day (BID) | ORAL | 0 refills | Status: DC

## 2017-08-22 NOTE — Progress Notes (Signed)
Subjective:    Patient ID: Jessica Villa, female    DOB: 04-Jul-1981, 36 y.o.   MRN: [id number]  HPI  Jessica Villa is a 36 yr old female who presents today with chief complaint of nasal congestion.  Reports chest congestion, nasal congestion.  Has tried multiple otc things.  Reports no fever. HA "behind the eyeball" on the left.  OTC measure and netipot only helping briefly.    Review of Systems See HPI  Past Medical History:  Diagnosis Date  . Anemia   . Depression   . Eczema   . History of ovarian cyst   . Hx of varicella   . Hypothyroidism   . Thyroid disease    hypothyroid     Social History   Socioeconomic History  . Marital status: Married    Spouse name: Not on file  . Number of children: Not on file  . Years of education: Not on file  . Highest education level: Not on file  Social Needs  . Financial resource strain: Not on file  . Food insecurity - worry: Not on file  . Food insecurity - inability: Not on file  . Transportation needs - medical: Not on file  . Transportation needs - non-medical: Not on file  Occupational History  . Not on file  Tobacco Use  . Smoking status: Never Smoker  . Smokeless tobacco: Never Used  Substance and Sexual Activity  . Alcohol use: Yes    Comment: 1 cocktail or wine per month none since pregnancy  . Drug use: No  . Sexual activity: Yes  Other Topics Concern  . Not on file  Social History Narrative   2 step children ( 6year and [id number] year old) lives with pt and her husband.    1 son born 2017   Stay at home mom   Has masters degree in education leadership   Has worked as a Dance movement psychotherapistteacher   Enjoys hiking   Crafts, crocheting, no pets    Past Surgical History:  Procedure Laterality Date  . CESAREAN SECTION N/A 02/18/2016   Procedure: CESAREAN SECTION;  Surgeon: Harold HedgeJames Tomblin, MD;  [facility];  Service: Obstetrics;  Laterality: N/A;  . NO PAST SURGERIES      Family History  Problem Relation Age of Onset    . Depression Mother   . Migraines Mother   . Dementia Father   . Diabetes Father   . Depression Father   . Hypothyroidism Father   . Alcohol abuse Maternal Grandmother   . Alcohol abuse Maternal Grandfather   . Hypothyroidism Paternal Grandmother   . Heart disease Paternal Grandfather   . Diabetes Paternal Grandfather   . Skin cancer Paternal Grandfather     No Known Allergies  Current Outpatient Medications on File Prior to Visit  Medication Sig Dispense Refill  . citalopram (CELEXA) 40 MG tablet TAKE 1 TABLET (40 MG TOTAL) BY MOUTH DAILY. 90 tablet 0  . HEATHER 0.35 MG tablet Take 1 tablet by mouth daily.  12  . levothyroxine (SYNTHROID, LEVOTHROID) 150 MCG tablet Take 150 mcg by mouth daily.  11   No current facility-administered medications on file prior to visit.     BP 107/64 (BP Location: Right Arm, Patient Position: Sitting, Cuff Size: Large)   Pulse 90   Temp 98.5 F (36.9 C) (Oral)   Resp 16   Ht 5\' 6"  (1.676 m)   Wt 212 lb (96.2 kg)   SpO2 100%  BMI 34.22 kg/m       Objective:   Physical Exam  Constitutional: She is oriented to person, place, and time. She appears well-developed and well-nourished.  HENT:  Head: Normocephalic and atraumatic.  Right Ear: Tympanic membrane and ear canal normal.  L TM occluded by cerumen   Cardiovascular: Normal rate, regular rhythm and normal heart sounds.  No murmur heard. Pulmonary/Chest: Effort normal. No respiratory distress. She has no wheezes. She has no rhonchi. She has rales in the left lower field.  Musculoskeletal: She exhibits no edema.  Neurological: She is alert and oriented to person, place, and time.  Skin: Skin is warm and dry.  Psychiatric: She has a normal mood and affect. Her behavior is normal. Judgment and thought content normal.          Assessment & Plan:  Cough- LLL crackles, check cxr to rule out pneumonia.  Further recommendations pending review of CXR.    Addendum:  CXR negative for  pneumonia. Will rx with amoxicillin to cover for sinusitis.  She is still breastfeeding.  Contacted pt via phone and advised her to start amoxicillin and call if new/worsening symptoms or if not improved in 3 days.

## 2017-08-22 NOTE — Patient Instructions (Signed)
Please complete chest x-ray prior to leaving. We will contact you with your results and further recommendations.

## 2017-08-24 ENCOUNTER — Ambulatory Visit: Payer: PRIVATE HEALTH INSURANCE | Admitting: Family

## 2017-08-29 ENCOUNTER — Telehealth: Payer: PRIVATE HEALTH INSURANCE | Admitting: Family

## 2017-08-29 DIAGNOSIS — R05 Cough: Secondary | ICD-10-CM

## 2017-08-29 DIAGNOSIS — R079 Chest pain, unspecified: Secondary | ICD-10-CM

## 2017-08-29 DIAGNOSIS — R059 Cough, unspecified: Secondary | ICD-10-CM

## 2017-08-29 NOTE — Progress Notes (Signed)
Based on what you shared with me it looks like you have a serious condition that should be evaluated in a face to face office visit.  NOTE: Even if you have entered your credit card information for this eVisit, you will not be charged.   If you are having a true medical emergency please call 911.  If you need an urgent face to face visit, [facility] has four urgent care centers for your convenience.  If you need care fast and have a high deductible or no insurance consider:   [email or web address]  [phone]  [address], [address] [place], [place] [address] 8 am to 8 pm Monday-Friday 10 am to 4 pm Saturday-Sunday   The following sites will take your  insurance:    . [facility]  [phone] Get Driving Directions Find a Provider at this Location  [address] [place], [place] [address] . 10 am to 8 pm Monday-Friday . 12 pm to 8 pm Saturday-Sunday   . [facility]  [phone] Get Driving Directions Find a Provider at this Location  [address], [address] [place], [place] [address] . 8 am to 8 pm Monday-Friday . 9 am to 6 pm Saturday . 11 am to 6 pm Sunday   . [facility] [place]  [phone] Get Driving Directions  [address] [place], [place] [address] . 8 am to 8 pm Monday-Friday . 8 am to 4 pm Saturday-Sunday   Your e-visit answers were reviewed by a board certified advanced clinical practitioner to complete your personal care plan.  Thank you for using e-Visits.  

## 2017-09-06 ENCOUNTER — Telehealth: Payer: Self-pay | Admitting: Family

## 2017-09-06 ENCOUNTER — Ambulatory Visit: Payer: PRIVATE HEALTH INSURANCE | Admitting: Family Medicine

## 2017-09-06 NOTE — Telephone Encounter (Signed)
Copied from CRM #[id number]. Topic: Quick Communication - See Telephone Encounter >> Sep 06, 2017  9:18 AM Landry MellowFoltz, Melissa J wrote: CRM for notification. See Telephone encounter for:   09/06/17. Pt is having more nasal and sinus congestion. Also sore throat., would like to know if she needs to be seen again or if something can be called into pharmacy.  Pharmacy [facility] [address] # [phone]

## 2017-09-06 NOTE — Telephone Encounter (Signed)
I think she should be seen for re-evaluation please. We should evaluate her for strep.

## 2017-09-06 NOTE — Telephone Encounter (Signed)
Left message for pt to call and schedule appt. Also sent my chart message.

## 2017-09-07 ENCOUNTER — Encounter: Payer: Self-pay | Admitting: Medical

## 2017-09-07 ENCOUNTER — Ambulatory Visit (INDEPENDENT_AMBULATORY_CARE_PROVIDER_SITE_OTHER): Payer: PRIVATE HEALTH INSURANCE | Admitting: Medical

## 2017-09-07 VITALS — BP 105/68 | HR 81 | Temp 98.1°F | Resp 16 | Ht 66.0 in | Wt 213.6 lb

## 2017-09-07 DIAGNOSIS — J029 Acute pharyngitis, unspecified: Secondary | ICD-10-CM

## 2017-09-07 DIAGNOSIS — R059 Cough, unspecified: Secondary | ICD-10-CM

## 2017-09-07 DIAGNOSIS — J0110 Acute frontal sinusitis, unspecified: Secondary | ICD-10-CM

## 2017-09-07 DIAGNOSIS — R05 Cough: Secondary | ICD-10-CM

## 2017-09-07 LAB — POCT INFLUENZA A/B
Influenza A, POC: NEGATIVE
Influenza B, POC: NEGATIVE

## 2017-09-07 LAB — POCT RAPID STREP A (OFFICE): Rapid Strep A Screen: NEGATIVE

## 2017-09-07 MED ORDER — AMOXICILLIN-POT CLAVULANATE 875-125 MG PO TABS: 1.0000 | ORAL_TABLET | Freq: Two times a day (BID) | ORAL | 0 refills | Status: DC

## 2017-09-07 MED ORDER — FLUTICASONE PROPIONATE 50 MCG/ACT NA SUSP: 2.0000 | Freq: Every day | NASAL | 1 refills | Status: DC

## 2017-09-07 MED ORDER — CEFTRIAXONE SODIUM 1 G IJ SOLR
1.0000 g | Freq: Once | INTRAMUSCULAR | Status: AC
  Administered 2017-09-07: 1 g via INTRAMUSCULAR

## 2017-09-07 NOTE — Patient Instructions (Addendum)
You appear to have persistent  sinus infection. I am prescribing augmentin  antibiotic for the infection. To help with the nasal congestion I prescribed flonase nasal spray.   I do think you will benefit from rocephin 1 gram im.  Rest, hydrate, tylenol for fever.  Follow up in 7 days or as needed.

## 2017-09-07 NOTE — Telephone Encounter (Signed)
Pt scheduled appt with PA, Saguier for today at 9:45am.

## 2017-09-07 NOTE — Progress Notes (Signed)
Subjective:    Patient ID: Jessica Villa, female    DOB: Aug 06, 1981, 36 y.o.   MRN: [id number]  HPI   Pt in for illness since around Thanksgiving got sick with nasal congestion, sinus pressure and chest congestion. Eventually she came in on 08-22-2017. Pt was seen and treated. Given amoxicillin. 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 Her sinus pain has persisted. Chest congestion better than on last visit. No fever, no chills, no sweats or body aches.  Pt child is 4918 months old.  Pt strep test and flu test negative today.     Review of Systems  Constitutional: Negative for chills, fatigue and fever.  HENT: Positive for congestion and sinus pain. Negative for ear pain, facial swelling, postnasal drip, sore throat and trouble swallowing.        She is still blowing out some colored mucus from her nose.  Respiratory: Positive for cough. Negative for chest tightness, shortness of breath and wheezing.   Cardiovascular: Negative for chest pain and palpitations.  Genitourinary: Negative for dysuria, flank pain, frequency, hematuria and urgency.  Musculoskeletal: Negative for back pain.  Skin: Negative for rash.  Neurological: Negative for dizziness and headaches.  Hematological: Negative for adenopathy. Does not bruise/bleed easily.  Psychiatric/Behavioral: Negative for behavioral problems and confusion.    Past Medical History:  Diagnosis Date  . Anemia   . Depression   . Eczema   . History of ovarian cyst   . Hx of varicella   . Hypothyroidism   . Thyroid disease    hypothyroid     Social History   Socioeconomic History  . Marital status: Married    Spouse name: Not on file  . Number of children: Not on file  . Years of education: Not on file  . Highest education level: Not on file  Social Needs  . Financial resource strain: Not on file  . Food insecurity - worry: Not on file  . Food insecurity - inability: Not on file  . Transportation needs - medical: Not on file  . Transportation  needs - non-medical: Not on file  Occupational History  . Not on file  Tobacco Use  . Smoking status: Never Smoker  . Smokeless tobacco: Never Used  Substance and Sexual Activity  . Alcohol use: Yes    Comment: 1 cocktail or wine per month none since pregnancy  . Drug use: No  . Sexual activity: Yes  Other Topics Concern  . Not on file  Social History Narrative   2 step children ( 6year and [id number] year old) lives with pt and her husband.    1 son born 2017   Stay at home mom   Has masters degree in education leadership   Has worked as a Dance movement psychotherapistteacher   Enjoys hiking   Crafts, crocheting, no pets    Past Surgical History:  Procedure Laterality Date  . CESAREAN SECTION N/A 02/18/2016   Procedure: CESAREAN SECTION;  Surgeon: Harold HedgeJames Tomblin, MD;  Location: [facility];  Service: Obstetrics;  Laterality: N/A;  . NO PAST SURGERIES      Family History  Problem Relation Age of Onset  . Depression Mother   . Migraines Mother   . Dementia Father   . Diabetes Father   . Depression Father   . Hypothyroidism Father   . Alcohol abuse Maternal Grandmother   . Alcohol abuse Maternal Grandfather   . Hypothyroidism Paternal Grandmother   . Heart disease Paternal Grandfather   . Diabetes Paternal  Grandfather   . Skin cancer Paternal Grandfather     No Known Allergies  Current Outpatient Medications on File Prior to Visit  Medication Sig Dispense Refill  . citalopram (CELEXA) 40 MG tablet TAKE 1 TABLET (40 MG TOTAL) BY MOUTH DAILY. 90 tablet 0  . HEATHER 0.35 MG tablet Take 1 tablet by mouth daily.  12  . levothyroxine (SYNTHROID, LEVOTHROID) 150 MCG tablet Take 150 mcg by mouth daily.  11   No current facility-administered medications on file prior to visit.     BP 105/68   Pulse 81   Temp 98.1 F (36.7 C) (Oral)   Resp 16   Ht 5\' 6"  (1.676 m)   Wt 213 lb 9.6 oz (96.9 kg)   LMP 08/15/2017   SpO2 100%   BMI 34.48 kg/m       Objective:   Physical  Exam  General  Mental Status - Alert. General Appearance - Well groomed. Not in acute distress.  Skin Rashes- No Rashes.  HEENT Head- Normal. Ear Auditory Canal - Left- Normal. Right - Normal.Tympanic Membrane- Left- Normal. Right- Normal. Eye Sclera/Conjunctiva- Left- Normal. Right- Normal. Nose & Sinuses Nasal Mucosa- Left-  Boggy and Congested. Right-  Boggy and  Congested.Bilateral  No maxillary but  frontal sinus pressure. Mouth & Throat Lips: Upper Lip- Normal: no dryness, cracking, pallor, cyanosis, or vesicular eruption. Lower Lip-Normal: no dryness, cracking, pallor, cyanosis or vesicular eruption. Buccal Mucosa- Bilateral- No Aphthous ulcers. Oropharynx- No Discharge or Erythema. Tonsils: Characteristics- Bilateral- No Erythema or Congestion. Size/Enlargement- Bilateral- No enlargement. Discharge- bilateral-None.  Neck Neck- Supple. No Masses.   Chest and Lung Exam Auscultation: Breath Sounds:-Clear even and unlabored.  Cardiovascular Auscultation:Rythm- Regular, rate and rhythm. Murmurs & Other Heart Sounds:Ausculatation of the heart reveal- No Murmurs.  Lymphatic Head & Neck General Head & Neck Lymphatics: Bilateral: Description- No Localized lymphadenopathy.       Assessment & Plan:  You appear to have persistent  sinus infection. I am prescribing augmentin  antibiotic for the infection. To help with the nasal congestion I prescribed flonase nasal spray.   I do think you will benefit from rocephin 1 gram im.  Rest, hydrate, tylenol for fever.  Follow up in 7 days or as needed.  Jarius Dieudonne, Ramon DredgeEdward, PA-C

## 2017-10-15 ENCOUNTER — Encounter: Payer: Self-pay | Admitting: Adult Health

## 2017-10-15 ENCOUNTER — Ambulatory Visit (INDEPENDENT_AMBULATORY_CARE_PROVIDER_SITE_OTHER): Payer: BLUE CROSS/BLUE SHIELD | Admitting: Adult Health

## 2017-10-15 VITALS — BP 114/80 | HR 110 | Temp 98.2°F | Resp 16 | Wt 207.0 lb

## 2017-10-15 DIAGNOSIS — J029 Acute pharyngitis, unspecified: Secondary | ICD-10-CM

## 2017-10-15 LAB — POCT RAPID STREP A (OFFICE): Rapid Strep A Screen: POSITIVE — AB

## 2017-10-15 MED ORDER — CEPHALEXIN 500 MG PO CAPS: 500.0000 mg | ORAL_CAPSULE | Freq: Two times a day (BID) | ORAL | 0 refills | Status: AC

## 2017-10-15 NOTE — Progress Notes (Signed)
Subjective:    Patient ID: Jessica Villa, female    DOB: 1980/10/04, 37 y.o.   MRN: [id number]  Sore Throat   This is a new problem. There has been no fever. Associated symptoms include swollen glands and trouble swallowing. Pertinent negatives include no congestion or drooling.      Review of Systems  Constitutional: Positive for activity change and fatigue. Negative for chills, diaphoresis and fever.  HENT: Positive for sore throat and trouble swallowing. Negative for congestion, dental problem, drooling, mouth sores, sinus pressure and sinus pain.   All other systems reviewed and are negative.  Past Medical History:  Diagnosis Date  . Anemia   . Depression   . Eczema   . History of ovarian cyst   . Hx of varicella   . Hypothyroidism   . Thyroid disease    hypothyroid    Social History   Socioeconomic History  . Marital status: Married    Spouse name: Not on file  . Number of children: Not on file  . Years of education: Not on file  . Highest education level: Not on file  Social Needs  . Financial resource strain: Not on file  . Food insecurity - worry: Not on file  . Food insecurity - inability: Not on file  . Transportation needs - medical: Not on file  . Transportation needs - non-medical: Not on file  Occupational History  . Not on file  Tobacco Use  . Smoking status: Never Smoker  . Smokeless tobacco: Never Used  Substance and Sexual Activity  . Alcohol use: Yes    Comment: 1 cocktail or wine per month none since pregnancy  . Drug use: No  . Sexual activity: Yes  Other Topics Concern  . Not on file  Social History Narrative   2 step children ( 6year and 39 year old) lives with pt and her husband.    1 son born 2017   Stay at home mom   Has masters degree in education leadership   Has worked as a Dance movement psychotherapist, crocheting, no pets    Past Surgical History:  Procedure Laterality Date  . CESAREAN SECTION N/A 02/18/2016   Procedure: CESAREAN SECTION;  Surgeon: Harold Hedge, MD;  Location: [facility] BIRTHING SUITES;  Service: Obstetrics;  Laterality: N/A;  . NO PAST SURGERIES      Family History  Problem Relation Age of Onset  . Depression Mother   . Migraines Mother   . Dementia Father   . Diabetes Father   . Depression Father   . Hypothyroidism Father   . Alcohol abuse Maternal Grandmother   . Alcohol abuse Maternal Grandfather   . Hypothyroidism Paternal Grandmother   . Heart disease Paternal Grandfather   . Diabetes Paternal Grandfather   . Skin cancer Paternal Grandfather     No Known Allergies  Current Outpatient Medications on File Prior to Visit  Medication Sig Dispense Refill  . citalopram (CELEXA) 40 MG tablet TAKE 1 TABLET (40 MG TOTAL) BY MOUTH DAILY. 90 tablet 0  . fluticasone (FLONASE) 50 MCG/ACT nasal spray Place 2 sprays into both nostrils daily. 16 g 1  . HEATHER 0.35 MG tablet Take 1 tablet by mouth daily.  12  . levothyroxine (SYNTHROID, LEVOTHROID) 150 MCG tablet Take 150 mcg by mouth daily.  11   No current facility-administered medications on file prior to visit.     BP 114/80   Pulse (!) 110  Temp 98.2 F (36.8 C) (Oral)   Resp 16   Wt 207 lb (93.9 kg)   SpO2 98%   BMI 33.41 kg/m       Objective:   Physical Exam  Constitutional: She appears well-developed and well-nourished. No distress.  HENT:  Mouth/Throat: Uvula is midline and mucous membranes are normal. Oropharyngeal exudate, posterior oropharyngeal edema and posterior oropharyngeal erythema present. No tonsillar abscesses.  Cardiovascular: Normal rate, regular rhythm, normal heart sounds and intact distal pulses. Exam reveals no gallop and no friction rub.  No murmur heard. Pulmonary/Chest: Effort normal. No respiratory distress. She has no wheezes. She has no rales. She exhibits no tenderness.  Skin: She is not diaphoretic.  Nursing note and vitals reviewed.     Assessment & Plan:  1. Sore throat  -  POCT rapid strep A- positive  - Will prescribe keflex due to breast feeding  - cephALEXin (KEFLEX) 500 MG capsule; Take 1 capsule (500 mg total) by mouth 2 (two) times daily for 10 days.  Dispense: 20 capsule; Refill: 0 - warm saltwater gargles - follow up with PCP if needed  Shirline Freesory Chavon Lucarelli, NP

## 2017-10-15 NOTE — Patient Instructions (Signed)
You tested positive for strep   I have sent in Keflex. This is an antibiotic. Take twice a day for 10 days   Gargle with warm salt water   Follow up with primary care if not resolved by the completion of antibiotics

## 2017-10-17 ENCOUNTER — Ambulatory Visit: Payer: BLUE CROSS/BLUE SHIELD | Admitting: Family

## 2017-12-09 ENCOUNTER — Ambulatory Visit: Payer: BLUE CROSS/BLUE SHIELD | Admitting: Medical

## 2017-12-09 ENCOUNTER — Encounter: Payer: Self-pay | Admitting: Medical

## 2017-12-09 VITALS — BP 116/64 | HR 82 | Temp 98.2°F | Resp 16 | Ht 66.0 in | Wt 215.8 lb

## 2017-12-09 DIAGNOSIS — J029 Acute pharyngitis, unspecified: Secondary | ICD-10-CM | POA: Diagnosis not present

## 2017-12-09 DIAGNOSIS — J020 Streptococcal pharyngitis: Secondary | ICD-10-CM | POA: Diagnosis not present

## 2017-12-09 DIAGNOSIS — R52 Pain, unspecified: Secondary | ICD-10-CM | POA: Diagnosis not present

## 2017-12-09 LAB — POC INFLUENZA A&B (BINAX/QUICKVUE)
INFLUENZA A, POC: NEGATIVE
INFLUENZA B, POC: NEGATIVE

## 2017-12-09 LAB — POCT RAPID STREP A (OFFICE): Rapid Strep A Screen: POSITIVE — AB

## 2017-12-09 MED ORDER — AMOXICILLIN 875 MG PO TABS: 875.0000 mg | ORAL_TABLET | Freq: Two times a day (BID) | ORAL | 0 refills | Status: DC

## 2017-12-09 NOTE — Patient Instructions (Addendum)
Your strep test was positive and your flu test was negative.  For strep throat we will give amoxicillin.   Can use Tylenol or ibuprofen for sore throat, fever or body aches.   You should gradually get better but if not please let me know.  Follow-up in 7 days or as needed.

## 2017-12-09 NOTE — Progress Notes (Signed)
p 

## 2017-12-09 NOTE — Progress Notes (Signed)
Subjective:    Patient ID: Jessica Villa, female    DOB: Oct 24, 1980, 37 y.o.   MRN: [id number]  HPI   Pt in for st. She went to her childs pediatrician office just 2 days. ST moderate to severe.   Mild body aches and ha. Mid fever and chills.   Pt taking tylenol and cough drops.  Rare occasional cough drop to sooth throat.   Review of Systems  Constitutional: Positive for chills, fatigue and fever.  HENT: Positive for sore throat. Negative for congestion, sinus pressure, sinus pain and trouble swallowing.   Respiratory: Negative for cough, shortness of breath and wheezing.   Cardiovascular: Negative for chest pain and palpitations.  Gastrointestinal: Negative for abdominal pain, diarrhea, nausea and vomiting.  Musculoskeletal: Positive for myalgias. Negative for back pain.  Skin: Negative for rash.  Neurological: Negative for dizziness and light-headedness.  Hematological: Positive for adenopathy.  Psychiatric/Behavioral: Negative for confusion.    Past Medical History:  Diagnosis Date  . Anemia   . Depression   . Eczema   . History of ovarian cyst   . Hx of varicella   . Hypothyroidism   . Thyroid disease    hypothyroid     Social History   Socioeconomic History  . Marital status: Married    Spouse name: Not on file  . Number of children: Not on file  . Years of education: Not on file  . Highest education level: Not on file  Occupational History  . Not on file  Social Needs  . Financial resource strain: Not on file  . Food insecurity:    Worry: Not on file    Inability: Not on file  . Transportation needs:    Medical: Not on file    Non-medical: Not on file  Tobacco Use  . Smoking status: Never Smoker  . Smokeless tobacco: Never Used  Substance and Sexual Activity  . Alcohol use: Yes    Comment: 1 cocktail or wine per month none since pregnancy  . Drug use: No  . Sexual activity: Yes  Lifestyle  . Physical activity:    Days per week: Not on file     Minutes per session: Not on file  . Stress: Not on file  Relationships  . Social connections:    Talks on phone: Not on file    Gets together: Not on file    Attends religious service: Not on file    Active member of club or organization: Not on file    Attends meetings of clubs or organizations: Not on file    Relationship status: Not on file  . Intimate partner violence:    Fear of current or ex partner: Not on file    Emotionally abused: Not on file    Physically abused: Not on file    Forced sexual activity: Not on file  Other Topics Concern  . Not on file  Social History Narrative   2 step children ( 6year and [id number] year old) lives with pt and her husband.    1 son born 2017   Stay at home mom   Has masters degree in education leadership   Has worked as a Dance movement psychotherapistteacher   Enjoys hiking   Crafts, crocheting, no pets    Past Surgical History:  Procedure Laterality Date  . CESAREAN SECTION N/A 02/18/2016   Procedure: CESAREAN SECTION;  Surgeon: Harold HedgeJames Tomblin, MD;  [facility];  Service: Obstetrics;  Laterality: N/A;  .  NO PAST SURGERIES      Family History  Problem Relation Age of Onset  . Depression Mother   . Migraines Mother   . Dementia Father   . Diabetes Father   . Depression Father   . Hypothyroidism Father   . Alcohol abuse Maternal Grandmother   . Alcohol abuse Maternal Grandfather   . Hypothyroidism Paternal Grandmother   . Heart disease Paternal Grandfather   . Diabetes Paternal Grandfather   . Skin cancer Paternal Grandfather     No Known Allergies  Current Outpatient Medications on File Prior to Visit  Medication Sig Dispense Refill  . citalopram (CELEXA) 40 MG tablet TAKE 1 TABLET (40 MG TOTAL) BY MOUTH DAILY. 90 tablet 0  . HEATHER 0.35 MG tablet Take 1 tablet by mouth daily.  12  . levothyroxine (SYNTHROID, LEVOTHROID) 150 MCG tablet Take 150 mcg by mouth daily.  11   No current facility-administered medications on file prior to  visit.     BP 116/64 (BP Location: Right Arm, Patient Position: Sitting, Cuff Size: Normal)   Pulse 82   Temp 98.2 F (36.8 C) (Oral)   Resp 16   Ht 5\' 6"  (1.676 m)   Wt 215 lb 12.8 oz (97.9 kg)   SpO2 98%   BMI 34.83 kg/m       Objective:   Physical Exam   General  Mental Status - Alert. General Appearance - Well groomed. Not in acute distress.  Skin Rashes- No Rashes.  HEENT Head- Normal. Ear Auditory Canal - Left- Normal. Right - Normal.Tympanic Membrane- Left- Normal. Right- Normal. Eye Sclera/Conjunctiva- Left- Normal. Right- Normal. Nose & Sinuses Nasal Mucosa- Left-  Boggy and Congested. Right-  Boggy and  Congested.Bilateral maxillary and frontal sinus pressure. Mouth & Throat Lips: Upper Lip- Normal: no dryness, cracking, pallor, cyanosis, or vesicular eruption. Lower Lip-Normal: no dryness, cracking, pallor, cyanosis or vesicular eruption. Buccal Mucosa- Bilateral- No Aphthous ulcers. Oropharynx- No Discharge or Erythema. Tonsils: Characteristics- Bilateral- Bright Erythema. Size/Enlargement- Bilateral- 2+  Discharge- bilateral-None.  Neck Neck- Supple. No Masses.moderate submandibular node enlargement.   Chest and Lung Exam Auscultation: Breath Sounds:-Clear even and unlabored.  Cardiovascular Auscultation:Rythm- Regular, rate and rhythm. Murmurs & Other Heart Sounds:Ausculatation of the heart reveal- No Murmurs.  Lymphatic Head & Neck General Head & Neck Lymphatics: Bilateral: Description-see neck exam      Assessment & Plan:  Your strep test was positive and your flu test was negative.  For strep throat we will give amoxicillin.   Can use Tylenol or ibuprofen for sore throat, fever or body aches.   You should gradually get better but if not please let me know.  Follow-up in 7 days or as needed.  Esperanza Richters, PA-C

## 2018-01-27 ENCOUNTER — Other Ambulatory Visit: Payer: Self-pay | Admitting: Family

## 2018-02-24 ENCOUNTER — Encounter: Payer: Self-pay | Admitting: Family

## 2018-03-06 ENCOUNTER — Encounter: Payer: BLUE CROSS/BLUE SHIELD | Admitting: Family

## 2018-04-21 ENCOUNTER — Ambulatory Visit (INDEPENDENT_AMBULATORY_CARE_PROVIDER_SITE_OTHER): Payer: BLUE CROSS/BLUE SHIELD | Admitting: Family

## 2018-04-21 ENCOUNTER — Encounter: Payer: Self-pay | Admitting: Family

## 2018-04-21 VITALS — BP 120/64 | HR 72 | Temp 98.6°F | Resp 16 | Ht 66.0 in | Wt 210.0 lb

## 2018-04-21 DIAGNOSIS — Z0000 Encounter for general adult medical examination without abnormal findings: Secondary | ICD-10-CM

## 2018-04-21 NOTE — Progress Notes (Signed)
Subjective:    Patient ID: Jessica Villa, female    DOB: [birth year], 37 y.o.   MRN: [id number]  HPI  Patient presents today for complete physical.  Immunizations: tdap 2017 Diet: reports fair diet Wt Readings from Last 3 Encounters:  04/21/18 210 lb (95.3 kg)  12/09/17 215 lb 12.8 oz (97.9 kg)  10/15/17 207 lb (93.9 kg)  Exercise: not exercising  Pap Smear: up to date per OB/GYN Mammogram: will start at 38     Review of Systems  Constitutional: Negative for unexpected weight change.  HENT: Negative for hearing loss and rhinorrhea.   Eyes: Negative for visual disturbance.  Respiratory: Negative for cough and wheezing.   Cardiovascular: Negative for leg swelling.  Gastrointestinal: Negative for constipation and diarrhea.       Reports some occasional bleeding from her hemorrhoids  Genitourinary: Negative for dysuria, frequency and hematuria.  Musculoskeletal: Negative for arthralgias and myalgias.  Neurological: Negative for headaches.  Hematological: Negative for adenopathy.  Psychiatric/Behavioral:       Denies depression/anxiety, some family stress   Past Medical History:  Diagnosis Date  . Anemia   . Depression   . Eczema   . History of ovarian cyst   . Hx of varicella   . Hypothyroidism   . Thyroid disease    hypothyroid     Social History   Socioeconomic History  . Marital status: Married    Spouse name: Not on file  . Number of children: Not on file  . Years of education: Not on file  . Highest education level: Not on file  Occupational History  . Not on file  Social Needs  . Financial resource strain: Not on file  . Food insecurity:    Worry: Not on file    Inability: Not on file  . Transportation needs:    Medical: Not on file    Non-medical: Not on file  Tobacco Use  . Smoking status: Never Smoker  . Smokeless tobacco: Never Used  Substance and Sexual Activity  . Alcohol use: Yes    Comment: 1 cocktail or wine per month none since  pregnancy  . Drug use: No  . Sexual activity: Yes  Lifestyle  . Physical activity:    Days per week: Not on file    Minutes per session: Not on file  . Stress: Not on file  Relationships  . Social connections:    Talks on phone: Not on file    Gets together: Not on file    Attends religious service: Not on file    Active member of club or organization: Not on file    Attends meetings of clubs or organizations: Not on file    Relationship status: Not on file  . Intimate partner violence:    Fear of current or ex partner: Not on file    Emotionally abused: Not on file    Physically abused: Not on file    Forced sexual activity: Not on file  Other Topics Concern  . Not on file  Social History Narrative   2 step children ( 6year and 39 year old) lives with pt and her husband.    1 son born 2017   Stay at home mom   Has masters degree in education leadership   Has worked as a Dance movement psychotherapist, crocheting, no pets       Past Surgical History:  Procedure Laterality Date  . CESAREAN SECTION N/A  02/18/2016   Procedure: CESAREAN SECTION;  Surgeon: Harold HedgeJames Tomblin, MD;  [facility];  Service: Obstetrics;  Laterality: N/A;  . NO PAST SURGERIES      Family History  Problem Relation Age of Onset  . Depression Mother   . Migraines Mother   . Dementia Father   . Diabetes Father   . Depression Father   . Hypothyroidism Father   . Alcohol abuse Maternal Grandmother   . Alcohol abuse Maternal Grandfather   . Hypothyroidism Paternal Grandmother   . Heart disease Paternal Grandfather   . Diabetes Paternal Grandfather   . Skin cancer Paternal Grandfather     No Known Allergies  Current Outpatient Medications on File Prior to Visit  Medication Sig Dispense Refill  . citalopram (CELEXA) 40 MG tablet TAKE 1 TABLET (40 MG TOTAL) BY MOUTH DAILY. 90 tablet 0  . fluocinonide ointment (LIDEX) 0.05 % APPLY TO AFFECTED AREA ON HANDS TWICE A DAY AS  DIRECTED  2  . HEATHER 0.35 MG tablet Take 1 tablet by mouth daily.  12  . levothyroxine (SYNTHROID, LEVOTHROID) 150 MCG tablet Take 150 mcg by mouth daily.  11   No current facility-administered medications on file prior to visit.     BP 120/64 (BP Location: Right Arm, Patient Position: Sitting, Cuff Size: Small)   Pulse 72   Temp 98.6 F (37 C) (Oral)   Resp 16   Ht 5\' 6"  (1.676 m)   Wt 210 lb (95.3 kg)   LMP 03/19/2018   SpO2 97%   BMI 33.89 kg/m       Objective:   Physical Exam   Physical Exam  Constitutional: She is oriented to person, place, and time. She appears well-developed and well-nourished. No distress.  HENT:  Head: Normocephalic and atraumatic.  Right Ear: Tympanic membrane and ear canal normal.  Left Ear: Tympanic membrane and ear canal normal.  Mouth/Throat: Oropharynx is clear and moist.  Eyes: Pupils are equal, round, and reactive to light. No scleral icterus.  Neck: Normal range of motion. No thyromegaly present.  Cardiovascular: Normal rate and regular rhythm.   No murmur heard. Pulmonary/Chest: Effort normal and breath sounds normal. No respiratory distress. He has no wheezes. She has no rales. She exhibits no tenderness.  Abdominal: Soft. Bowel sounds are normal. She exhibits no distension and no mass. There is no tenderness. There is no rebound and no guarding.  Musculoskeletal: She exhibits no edema.  Lymphadenopathy:    She has no cervical adenopathy.  Neurological: She is alert and oriented to person, place, and time. She has normal patellar reflexes. She exhibits normal muscle tone. Coordination normal.  Skin: Skin is warm and dry. eczema bilateral palms Psychiatric: She has a normal mood and affect. Her behavior is normal. Judgment and thought content normal.  Breast/pelvic: deferred           Assessment & Plan:  Preventative care- discussed healthy diet, exercise, weight loss.  Obtain routine lab work.  Pap per gyn, she will return  for follow up to complete a rectal exam due c/o ? Hemorrhoidal rectal bleeding. Has menses today.      Assessment & Plan:

## 2018-04-21 NOTE — Patient Instructions (Signed)
Please complete lab work  Prior to leaving.

## 2018-04-22 LAB — HEPATIC FUNCTION PANEL
AG RATIO: 1.5 (calc) (ref 1.0–2.5)
ALKALINE PHOSPHATASE (APISO): 53 U/L (ref 33–115)
ALT: 10 U/L (ref 6–29)
AST: 18 U/L (ref 10–30)
Albumin: 4.1 g/dL (ref 3.6–5.1)
BILIRUBIN DIRECT: 0.2 mg/dL (ref 0.0–0.2)
BILIRUBIN TOTAL: 0.6 mg/dL (ref 0.2–1.2)
Globulin: 2.7 g/dL (calc) (ref 1.9–3.7)
Indirect Bilirubin: 0.4 mg/dL (calc) (ref 0.2–1.2)
Total Protein: 6.8 g/dL (ref 6.1–8.1)

## 2018-04-22 LAB — URINALYSIS, ROUTINE W REFLEX MICROSCOPIC
BACTERIA UA: NONE SEEN /HPF
Bilirubin Urine: NEGATIVE
Glucose, UA: NEGATIVE
Hyaline Cast: NONE SEEN /LPF
KETONES UR: NEGATIVE
LEUKOCYTES UA: NEGATIVE
NITRITE: NEGATIVE
Protein, ur: NEGATIVE
SPECIFIC GRAVITY, URINE: 1.012 (ref 1.001–1.03)
WBC, UA: NONE SEEN /HPF (ref 0–5)

## 2018-04-22 LAB — CBC WITH DIFFERENTIAL/PLATELET
BASOS ABS: 83 {cells}/uL (ref 0–200)
BASOS PCT: 1.2 %
EOS ABS: 159 {cells}/uL (ref 15–500)
EOS PCT: 2.3 %
HEMATOCRIT: 40.8 % (ref 35.0–45.0)
HEMOGLOBIN: 13.2 g/dL (ref 11.7–15.5)
LYMPHS ABS: 2291 {cells}/uL (ref 850–3900)
MCH: 28.4 pg (ref 27.0–33.0)
MCHC: 32.4 g/dL (ref 32.0–36.0)
MCV: 87.9 fL (ref 80.0–100.0)
MPV: 11.4 fL (ref 7.5–12.5)
Monocytes Relative: 12.2 %
NEUTROS ABS: 3526 {cells}/uL (ref 1500–7800)
NEUTROS PCT: 51.1 %
Platelets: 261 10*3/uL (ref 140–400)
RBC: 4.64 10*6/uL (ref 3.80–5.10)
RDW: 12.2 % (ref 11.0–15.0)
Total Lymphocyte: 33.2 %
WBC mixed population: 842 cells/uL (ref 200–950)
WBC: 6.9 10*3/uL (ref 3.8–10.8)

## 2018-04-22 LAB — BASIC METABOLIC PANEL
BUN: 9 mg/dL (ref 7–25)
CALCIUM: 9.4 mg/dL (ref 8.6–10.2)
CO2: 26 mmol/L (ref 20–32)
CREATININE: 0.82 mg/dL (ref 0.50–1.10)
Chloride: 104 mmol/L (ref 98–110)
GLUCOSE: 91 mg/dL (ref 65–99)
Potassium: 4.2 mmol/L (ref 3.5–5.3)
Sodium: 137 mmol/L (ref 135–146)

## 2018-04-22 LAB — TSH: TSH: 6.62 m[IU]/L — AB

## 2018-04-22 LAB — LIPID PANEL
CHOL/HDL RATIO: 3.6 (calc) (ref <5.0)
CHOLESTEROL: 145 mg/dL (ref <200)
HDL: 40 mg/dL — AB (ref >50)
LDL CHOLESTEROL (CALC): 91 mg/dL
Non-HDL Cholesterol (Calc): 105 mg/dL (calc) (ref <130)
Triglycerides: 49 mg/dL (ref <150)

## 2018-04-25 ENCOUNTER — Telehealth: Payer: Self-pay | Admitting: Family

## 2018-04-25 DIAGNOSIS — E039 Hypothyroidism, unspecified: Secondary | ICD-10-CM

## 2018-04-25 MED ORDER — LEVOTHYROXINE SODIUM 175 MCG PO TABS
175.0000 ug | ORAL_TABLET | Freq: Every day | ORAL | 2 refills | Status: DC
Start: 2018-04-25 — End: 2018-07-19

## 2018-04-25 NOTE — Telephone Encounter (Signed)
Notified pt and scheduled lab appt for 06/06/18 at 11am. Future lab order entered.

## 2018-04-25 NOTE — Telephone Encounter (Signed)
Please contact patient and let her know that lab work shows that we need to increase her thyroid medication.  I have sent a prescription to increase her to Synthroid 175 mcg once daily.  She should plan to repeat TSH in 4 to 6 weeks.  Diagnosis hypothyroid.

## 2018-05-04 ENCOUNTER — Other Ambulatory Visit: Payer: Self-pay | Admitting: Family

## 2018-05-05 ENCOUNTER — Ambulatory Visit: Payer: BLUE CROSS/BLUE SHIELD | Admitting: Family

## 2018-05-05 ENCOUNTER — Encounter: Payer: Self-pay | Admitting: Family

## 2018-05-15 ENCOUNTER — Ambulatory Visit: Payer: BLUE CROSS/BLUE SHIELD | Admitting: Family

## 2018-05-15 ENCOUNTER — Encounter: Payer: Self-pay | Admitting: Family

## 2018-05-15 VITALS — BP 98/58 | HR 62 | Temp 98.3°F | Resp 18 | Ht 66.0 in | Wt 212.8 lb

## 2018-05-15 DIAGNOSIS — Z8719 Personal history of other diseases of the digestive system: Secondary | ICD-10-CM

## 2018-05-15 NOTE — Patient Instructions (Addendum)
Please call if you develop recurrent rectal bleeding. Eat a diet high in fiber and drink plenty of water to avoid constipation.

## 2018-05-15 NOTE — Progress Notes (Signed)
Subjective:    Patient ID: Jessica Villa, female    DOB: May 24, 1981, 37 y.o.   MRN: [id number]  HPI  Patient is a 37 yr old female who presents today to discuss previous episodes of BRBPR which occurred several months ago.  She has hx hemorrhoids. No pain, no recent bleeding. Rectal exam was deferred last visit due to menses.     Review of Systems See HPI  Past Medical History:  Diagnosis Date  . Anemia   . Depression   . Eczema   . History of ovarian cyst   . Hx of varicella   . Hypothyroidism   . Thyroid disease    hypothyroid     Social History   Socioeconomic History  . Marital status: Married    Spouse name: Not on file  . Number of children: Not on file  . Years of education: Not on file  . Highest education level: Not on file  Occupational History  . Not on file  Social Needs  . Financial resource strain: Not on file  . Food insecurity:    Worry: Not on file    Inability: Not on file  . Transportation needs:    Medical: Not on file    Non-medical: Not on file  Tobacco Use  . Smoking status: Never Smoker  . Smokeless tobacco: Never Used  Substance and Sexual Activity  . Alcohol use: Yes    Comment: 1 cocktail or wine per month none since pregnancy  . Drug use: No  . Sexual activity: Yes  Lifestyle  . Physical activity:    Days per week: Not on file    Minutes per session: Not on file  . Stress: Not on file  Relationships  . Social connections:    Talks on phone: Not on file    Gets together: Not on file    Attends religious service: Not on file    Active member of club or organization: Not on file    Attends meetings of clubs or organizations: Not on file    Relationship status: Not on file  . Intimate partner violence:    Fear of current or ex partner: Not on file    Emotionally abused: Not on file    Physically abused: Not on file    Forced sexual activity: Not on file  Other Topics Concern  . Not on file  Social History Narrative   2  step children ( 6year and [id number] year old) lives with pt and her husband.    1 son born 2017   Stay at home mom   Has masters degree in education leadership   Has worked as a Dance movement psychotherapistteacher   Enjoys hiking   Crafts, crocheting, no pets       Past Surgical History:  Procedure Laterality Date  . CESAREAN SECTION N/A 02/18/2016   Procedure: CESAREAN SECTION;  Surgeon: Harold HedgeJames Tomblin, MD;  [facility];  Service: Obstetrics;  Laterality: N/A;  . NO PAST SURGERIES      Family History  Problem Relation Age of Onset  . Depression Mother   . Migraines Mother   . Dementia Father   . Diabetes Father   . Depression Father   . Hypothyroidism Father   . Alcohol abuse Maternal Grandmother   . Alcohol abuse Maternal Grandfather   . Hypothyroidism Paternal Grandmother   . Heart disease Paternal Grandfather   . Diabetes Paternal Grandfather   . Skin cancer Paternal Grandfather  No Known Allergies  Current Outpatient Medications on File Prior to Visit  Medication Sig Dispense Refill  . citalopram (CELEXA) 40 MG tablet TAKE 1 TABLET BY MOUTH EVERY DAY 90 tablet 1  . fluocinonide ointment (LIDEX) 0.05 % APPLY TO AFFECTED AREA ON HANDS TWICE A DAY AS DIRECTED  2  . HEATHER 0.35 MG tablet Take 1 tablet by mouth daily.  12  . levothyroxine (SYNTHROID) 175 MCG tablet Take 1 tablet (175 mcg total) by mouth daily before breakfast. 30 tablet 2   No current facility-administered medications on file prior to visit.     BP (!) 98/58 (BP Location: Right Arm, Cuff Size: Large)   Pulse 62   Temp 98.3 F (36.8 C) (Oral)   Resp 18   Ht 5\' 6"  (1.676 m)   Wt 212 lb 12.8 oz (96.5 kg)   SpO2 100%   BMI 34.35 kg/m       Objective:   Physical Exam  Constitutional: She is oriented to person, place, and time. She appears well-developed and well-nourished.  Cardiovascular: Normal rate, regular rhythm and normal heart sounds.  No murmur heard. Pulmonary/Chest: Effort normal and breath  sounds normal. No respiratory distress. She has no wheezes.  Genitourinary:  Genitourinary Comments: Small external hemorrhoids. Normal rectal exam.  Hemocult negative.    Musculoskeletal: She exhibits no edema.  Neurological: She is alert and oriented to person, place, and time.  Skin: Skin is warm and dry.  Psychiatric: She has a normal mood and affect. Her behavior is normal. Judgment and thought content normal.          Assessment & Plan:  Hx of rectal bleeding- heme negative today.  Suspect intermittent hemorrhoidal bleeding.  Advised pt as follows:  Please call if you develop recurrent rectal bleeding. Eat a diet high in fiber and drink plenty of water to avoid constipation. If recurrent bleeding occurs despite above measures will plan referral to GI.  CBC was WNL at time of her physical- no anemia.  Lab Results  Component Value Date   WBC 6.9 04/21/2018   HGB 13.2 04/21/2018   HCT 40.8 04/21/2018   MCV 87.9 04/21/2018   PLT 261 04/21/2018

## 2018-06-01 ENCOUNTER — Encounter: Payer: Self-pay | Admitting: Family

## 2018-06-06 ENCOUNTER — Other Ambulatory Visit (INDEPENDENT_AMBULATORY_CARE_PROVIDER_SITE_OTHER): Payer: BLUE CROSS/BLUE SHIELD

## 2018-06-06 DIAGNOSIS — E039 Hypothyroidism, unspecified: Secondary | ICD-10-CM | POA: Diagnosis not present

## 2018-06-06 LAB — TSH: TSH: 2.43 u[IU]/mL (ref 0.35–4.50)

## 2018-06-09 DIAGNOSIS — N911 Secondary amenorrhea: Secondary | ICD-10-CM | POA: Diagnosis not present

## 2018-06-15 DIAGNOSIS — Z3685 Encounter for antenatal screening for Streptococcus B: Secondary | ICD-10-CM | POA: Diagnosis not present

## 2018-06-15 DIAGNOSIS — Z3491 Encounter for supervision of normal pregnancy, unspecified, first trimester: Secondary | ICD-10-CM | POA: Diagnosis not present

## 2018-06-15 DIAGNOSIS — Z3401 Encounter for supervision of normal first pregnancy, first trimester: Secondary | ICD-10-CM | POA: Diagnosis not present

## 2018-06-15 LAB — OB RESULTS CONSOLE RPR: RPR: NONREACTIVE

## 2018-06-15 LAB — OB RESULTS CONSOLE ABO/RH: RH Type: NEGATIVE

## 2018-06-15 LAB — OB RESULTS CONSOLE HEPATITIS B SURFACE ANTIGEN: Hepatitis B Surface Ag: NEGATIVE

## 2018-06-15 LAB — OB RESULTS CONSOLE HIV ANTIBODY (ROUTINE TESTING): HIV: NONREACTIVE

## 2018-06-15 LAB — OB RESULTS CONSOLE ANTIBODY SCREEN: Antibody Screen: NEGATIVE

## 2018-06-15 LAB — OB RESULTS CONSOLE RUBELLA ANTIBODY, IGM: Rubella: NON-IMMUNE/NOT IMMUNE

## 2018-06-19 DIAGNOSIS — O26851 Spotting complicating pregnancy, first trimester: Secondary | ICD-10-CM | POA: Diagnosis not present

## 2018-06-19 DIAGNOSIS — O99281 Endocrine, nutritional and metabolic diseases complicating pregnancy, first trimester: Secondary | ICD-10-CM | POA: Diagnosis not present

## 2018-06-19 DIAGNOSIS — Z3480 Encounter for supervision of other normal pregnancy, unspecified trimester: Secondary | ICD-10-CM | POA: Diagnosis not present

## 2018-06-19 DIAGNOSIS — Z3A09 9 weeks gestation of pregnancy: Secondary | ICD-10-CM | POA: Diagnosis not present

## 2018-06-28 DIAGNOSIS — O4691 Antepartum hemorrhage, unspecified, first trimester: Secondary | ICD-10-CM | POA: Diagnosis not present

## 2018-06-28 DIAGNOSIS — Z3A11 11 weeks gestation of pregnancy: Secondary | ICD-10-CM | POA: Diagnosis not present

## 2018-07-10 DIAGNOSIS — Z3682 Encounter for antenatal screening for nuchal translucency: Secondary | ICD-10-CM | POA: Diagnosis not present

## 2018-07-10 DIAGNOSIS — Z3A12 12 weeks gestation of pregnancy: Secondary | ICD-10-CM | POA: Diagnosis not present

## 2018-07-10 DIAGNOSIS — O09521 Supervision of elderly multigravida, first trimester: Secondary | ICD-10-CM | POA: Diagnosis not present

## 2018-07-19 ENCOUNTER — Other Ambulatory Visit: Payer: Self-pay | Admitting: Family

## 2018-07-19 NOTE — Telephone Encounter (Signed)
OK to follow up in August.

## 2018-07-19 NOTE — Telephone Encounter (Signed)
Levothyroxine refill sent to pharmacy. Pt last seen in Augusts for CPE and scheduled follow up for 04/2019.  Please advise if pt will need earlier follow up?

## 2018-07-21 DIAGNOSIS — Z3480 Encounter for supervision of other normal pregnancy, unspecified trimester: Secondary | ICD-10-CM | POA: Diagnosis not present

## 2018-08-23 DIAGNOSIS — Z363 Encounter for antenatal screening for malformations: Secondary | ICD-10-CM | POA: Diagnosis not present

## 2018-08-23 DIAGNOSIS — Z3A19 19 weeks gestation of pregnancy: Secondary | ICD-10-CM | POA: Diagnosis not present

## 2018-09-01 ENCOUNTER — Ambulatory Visit (INDEPENDENT_AMBULATORY_CARE_PROVIDER_SITE_OTHER): Payer: BLUE CROSS/BLUE SHIELD | Admitting: Medical

## 2018-09-01 ENCOUNTER — Encounter: Payer: Self-pay | Admitting: Medical

## 2018-09-01 VITALS — BP 118/50 | HR 102 | Temp 98.3°F | Resp 16 | Ht 66.0 in | Wt 230.0 lb

## 2018-09-01 DIAGNOSIS — R05 Cough: Secondary | ICD-10-CM | POA: Diagnosis not present

## 2018-09-01 DIAGNOSIS — J029 Acute pharyngitis, unspecified: Secondary | ICD-10-CM

## 2018-09-01 DIAGNOSIS — J3489 Other specified disorders of nose and nasal sinuses: Secondary | ICD-10-CM | POA: Diagnosis not present

## 2018-09-01 DIAGNOSIS — R059 Cough, unspecified: Secondary | ICD-10-CM

## 2018-09-01 MED ORDER — AMOXICILLIN 875 MG PO TABS: 875.0000 mg | ORAL_TABLET | Freq: Two times a day (BID) | ORAL | 0 refills | Status: DC

## 2018-09-01 NOTE — Patient Instructions (Addendum)
.   Your strep test was negative. However, your physical exam and clinical presentation is suspicious for strep and it is important to note that rapid strep test can be falsely negative. So I am going to give you amoxicillin  antibiotic today based on your exam and clinical presentation.  For cough and congestion recommend that you follow advisement/recommendation of your gyn.  Rest hydrate, tylenol for fever, and warm salt water gargles.   Follow up in 7 days or as needed

## 2018-09-01 NOTE — Progress Notes (Signed)
Subjective:    Patient ID: Jessica Villa, female    DOB: Feb 13, 1981, 37 y.o.   MRN: [id number]  HPI  Pt in with sick since Monday with below signs and symptoms. St moderate. Sinus pressure also moderate. Cough is mildly productive. She is pregnant.  Pt daughter tested positive for strep. Husband sick as well.   Review of Systems  Constitutional: Positive for chills and fatigue. Negative for fever.  HENT: Positive for postnasal drip, rhinorrhea, sinus pressure, sinus pain, sore throat and voice change.   Respiratory: Positive for cough.   Cardiovascular: Negative for chest pain and palpitations.  Musculoskeletal: Positive for back pain. Negative for myalgias.  Skin: Negative for rash.  Neurological: Negative for dizziness and headaches.  Hematological: Negative for adenopathy. Does not bruise/bleed easily.  Psychiatric/Behavioral: Negative for behavioral problems and confusion.    Past Medical History:  Diagnosis Date  . Anemia   . Depression   . Eczema   . History of ovarian cyst   . Hx of varicella   . Hypothyroidism   . Thyroid disease    hypothyroid     Social History   Socioeconomic History  . Marital status: Married    Spouse name: Not on file  . Number of children: Not on file  . Years of education: Not on file  . Highest education level: Not on file  Occupational History  . Not on file  Social Needs  . Financial resource strain: Not on file  . Food insecurity:    Worry: Not on file    Inability: Not on file  . Transportation needs:    Medical: Not on file    Non-medical: Not on file  Tobacco Use  . Smoking status: Never Smoker  . Smokeless tobacco: Never Used  Substance and Sexual Activity  . Alcohol use: Yes    Comment: 1 cocktail or wine per month none since pregnancy  . Drug use: No  . Sexual activity: Yes  Lifestyle  . Physical activity:    Days per week: Not on file    Minutes per session: Not on file  . Stress: Not on file    Relationships  . Social connections:    Talks on phone: Not on file    Gets together: Not on file    Attends religious service: Not on file    Active member of club or organization: Not on file    Attends meetings of clubs or organizations: Not on file    Relationship status: Not on file  . Intimate partner violence:    Fear of current or ex partner: Not on file    Emotionally abused: Not on file    Physically abused: Not on file    Forced sexual activity: Not on file  Other Topics Concern  . Not on file  Social History Narrative   2 step children ( 6year and 39 year old) lives with pt and her husband.    1 son born 2017   Stay at home mom   Has masters degree in education leadership   Has worked as a Dance movement psychotherapist, crocheting, no pets       Past Surgical History:  Procedure Laterality Date  . CESAREAN SECTION N/A 02/18/2016   Procedure: CESAREAN SECTION;  Surgeon: Harold Hedge, MD;  Location: [facility];  Service: Obstetrics;  Laterality: N/A;  . NO PAST SURGERIES      Family History  Problem Relation  Age of Onset  . Depression Mother   . Migraines Mother   . Dementia Father   . Diabetes Father   . Depression Father   . Hypothyroidism Father   . Alcohol abuse Maternal Grandmother   . Alcohol abuse Maternal Grandfather   . Hypothyroidism Paternal Grandmother   . Heart disease Paternal Grandfather   . Diabetes Paternal Grandfather   . Skin cancer Paternal Grandfather     No Known Allergies  Current Outpatient Medications on File Prior to Visit  Medication Sig Dispense Refill  . citalopram (CELEXA) 40 MG tablet TAKE 1 TABLET BY MOUTH EVERY DAY 90 tablet 1  . HEATHER 0.35 MG tablet Take 1 tablet by mouth daily.  12   No current facility-administered medications on file prior to visit.     BP (!) 118/50   Pulse (!) 102   Temp 98.3 F (36.8 C) (Oral)   Resp 16   Ht 5\' 6"  (1.676 m)   Wt 230 lb (104.3 kg)   SpO2 100%   BMI  37.12 kg/m       Objective:   Physical Exam  General  Mental Status - Alert. General Appearance - Well groomed. Not in acute distress.  Skin Rashes- No Rashes.  HEENT Head- Normal. Ear Auditory Canal - Left- Normal. Right - Normal.Tympanic Membrane- Left- Normal. Right- Normal. Eye Sclera/Conjunctiva- Left- Normal. Right- Normal. Nose & Sinuses Nasal Mucosa- Left-  Boggy and Congested. Right-  Boggy and  Congested.Bilateral maxillary and frontal sinus pressure. Mouth & Throat Lips: Upper Lip- Normal: no dryness, cracking, pallor, cyanosis, or vesicular eruption. Lower Lip-Normal: no dryness, cracking, pallor, cyanosis or vesicular eruption. Buccal Mucosa- Bilateral- No Aphthous ulcers. Oropharynx- No Discharge or Erythema. Tonsils: Characteristics- Bilateral- No Erythema or Congestion. Size/Enlargement- Bilateral- No enlargement. Discharge- bilateral-None.  Neck Neck- Supple. No Masses.   Chest and Lung Exam Auscultation: Breath Sounds:-Clear even and unlabored.  Cardiovascular Auscultation:Rythm- Regular, rate and rhythm. Murmurs & Other Heart Sounds:Ausculatation of the heart reveal- No Murmurs.  Lymphatic Head & Neck General Head & Neck Lymphatics: Bilateral: Description- No Localized lymphadenopathy.       Assessment & Plan:  . Your strep test was negative. However, your physical exam and clinical presentation is suspicious for strep and it is important to note that rapid strep test can be falsely negative. So I am going to give you amoxicillin  antibiotic today based on your exam and clinical presentation.  For cough and congestion recommend that you follow advisement/recommendation of your gyn.  Rest hydrate, tylenol for fever, and warm salt water gargles.   Follow up in 7 days or as needed.  Esperanza RichtersEdward Shawniece Oyola, PA-C

## 2018-09-26 DIAGNOSIS — Z3A23 23 weeks gestation of pregnancy: Secondary | ICD-10-CM | POA: Diagnosis not present

## 2018-09-26 DIAGNOSIS — E039 Hypothyroidism, unspecified: Secondary | ICD-10-CM | POA: Diagnosis not present

## 2018-09-26 DIAGNOSIS — Z362 Encounter for other antenatal screening follow-up: Secondary | ICD-10-CM | POA: Diagnosis not present

## 2018-10-25 DIAGNOSIS — Z3A28 28 weeks gestation of pregnancy: Secondary | ICD-10-CM | POA: Diagnosis not present

## 2018-10-25 DIAGNOSIS — O360920 Maternal care for other rhesus isoimmunization, second trimester, not applicable or unspecified: Secondary | ICD-10-CM | POA: Diagnosis not present

## 2018-10-25 DIAGNOSIS — Z3480 Encounter for supervision of other normal pregnancy, unspecified trimester: Secondary | ICD-10-CM | POA: Diagnosis not present

## 2018-10-25 DIAGNOSIS — Z23 Encounter for immunization: Secondary | ICD-10-CM | POA: Diagnosis not present

## 2018-11-07 DIAGNOSIS — Z3400 Encounter for supervision of normal first pregnancy, unspecified trimester: Secondary | ICD-10-CM | POA: Diagnosis not present

## 2018-11-07 DIAGNOSIS — D649 Anemia, unspecified: Secondary | ICD-10-CM | POA: Diagnosis not present

## 2018-11-13 ENCOUNTER — Other Ambulatory Visit (HOSPITAL_COMMUNITY): Payer: Self-pay | Admitting: *Deleted

## 2018-11-14 ENCOUNTER — Ambulatory Visit (HOSPITAL_COMMUNITY)
Admission: RE | Admit: 2018-11-14 | Discharge: 2018-11-14 | Disposition: A | Discharge status: Home / Self Care | Payer: BLUE CROSS/BLUE SHIELD | Source: Ambulatory Visit | Attending: Obstetrics and Gynecology | Admitting: Obstetrics and Gynecology

## 2018-11-14 DIAGNOSIS — D509 Iron deficiency anemia, unspecified: Secondary | ICD-10-CM | POA: Diagnosis not present

## 2018-11-14 MED ORDER — SODIUM CHLORIDE 0.9 % IV SOLN
510.0000 mg | INTRAVENOUS | Status: DC
  Administered 2018-11-14: 510 mg via INTRAVENOUS
  Filled 2018-11-14: qty 510

## 2018-11-14 NOTE — Discharge Instructions (Signed)

## 2018-11-17 ENCOUNTER — Encounter (HOSPITAL_COMMUNITY): Payer: BLUE CROSS/BLUE SHIELD

## 2018-11-20 ENCOUNTER — Encounter (HOSPITAL_COMMUNITY)
Admission: RE | Admit: 2018-11-20 | Discharge: 2018-11-20 | Disposition: A | Discharge status: Home / Self Care | Payer: BLUE CROSS/BLUE SHIELD | Source: Ambulatory Visit | Attending: Obstetrics and Gynecology | Admitting: Obstetrics and Gynecology

## 2018-11-20 DIAGNOSIS — D509 Iron deficiency anemia, unspecified: Secondary | ICD-10-CM | POA: Insufficient documentation

## 2018-11-20 MED ORDER — SODIUM CHLORIDE 0.9 % IV SOLN
510.0000 mg | INTRAVENOUS | Status: DC
  Administered 2018-11-20: 510 mg via INTRAVENOUS
  Filled 2018-11-20: qty 17

## 2018-12-07 DIAGNOSIS — E039 Hypothyroidism, unspecified: Secondary | ICD-10-CM | POA: Diagnosis not present

## 2018-12-07 DIAGNOSIS — D649 Anemia, unspecified: Secondary | ICD-10-CM | POA: Diagnosis not present

## 2018-12-15 ENCOUNTER — Encounter (HOSPITAL_COMMUNITY): Payer: BLUE CROSS/BLUE SHIELD

## 2018-12-18 DIAGNOSIS — Z3685 Encounter for antenatal screening for Streptococcus B: Secondary | ICD-10-CM | POA: Diagnosis not present

## 2018-12-26 ENCOUNTER — Encounter (HOSPITAL_COMMUNITY): Payer: Self-pay | Admitting: *Deleted

## 2018-12-27 ENCOUNTER — Telehealth (HOSPITAL_COMMUNITY): Payer: Self-pay | Admitting: *Deleted

## 2018-12-27 NOTE — Telephone Encounter (Signed)
Preadmission screen  

## 2018-12-28 ENCOUNTER — Encounter (HOSPITAL_COMMUNITY): Payer: Self-pay

## 2018-12-28 ENCOUNTER — Telehealth (HOSPITAL_COMMUNITY): Payer: Self-pay | Admitting: *Deleted

## 2018-12-28 NOTE — Patient Instructions (Signed)
Jessica Villa  12/28/2018   Your procedure is scheduled on:  01/10/2019  Arrive at 0500 at [employer] [facility] [address] at [facility]  and [employer]. You are invited to use the FREE valet parking or use the Visitor's parking deck.  Pick up the phone at the desk and dial [phone].  Call this number if you have problems the morning of surgery: [phone]  Remember:   Do not eat food:(After Midnight) Desps de medianoche.  Do not drink clear liquids: (After Midnight) Desps de medianoche.  Take these medicines the morning of surgery with A SIP OF WATER:  Take synthroid as prescribed   Do not wear jewelry, make-up or nail polish.  Do not wear lotions, powders, or perfumes. Do not wear deodorant.  Do not shave 48 hours prior to surgery.  Do not bring valuables to the hospital.  [facility] is not   responsible for any belongings or valuables brought to the hospital.  Contacts, dentures or bridgework may not be worn into surgery.  Leave suitcase in the car. After surgery it may be brought to your room.  For patients admitted to the hospital, checkout time is 11:00 AM the day of              discharge.      Please read over the following fact sheets that you were given:     Preparing for Surgery

## 2018-12-28 NOTE — Telephone Encounter (Signed)
Preadmission screen  

## 2019-01-03 ENCOUNTER — Other Ambulatory Visit: Payer: Self-pay | Admitting: Family

## 2019-01-03 NOTE — Telephone Encounter (Signed)
Patient did not request this refill. She is scheduled for a c-section 01-10-19, advised patient to call us for follow up at any time and we can give her a virtual visit appointment.

## 2019-01-09 ENCOUNTER — Encounter (HOSPITAL_COMMUNITY)
Admission: RE | Admit: 2019-01-09 | Discharge: 2019-01-09 | Disposition: A | Discharge status: Home / Self Care | Payer: 59 | Source: Ambulatory Visit

## 2019-01-09 NOTE — H&P (Signed)
Jessica Villa is a 38 y.o. female presenting for repeat cesareans section and bilateral tubal ligation. Pregnancy complicated by AMA>normal Panorama, hypothyroidism, anemia S/P iron infusion. OB History    Gravida  3   Para  1   Term  1   Preterm      AB  1   Living  1     SAB  1   TAB      Ectopic      Multiple  0   Live Births  1          Past Medical History:  Diagnosis Date  . Anemia   . Depression   . Eczema   . History of ovarian cyst   . Hx of varicella   . Hypothyroidism   . Thyroid disease    hypothyroid   Past Surgical History:  Procedure Laterality Date  . CESAREAN SECTION N/A 02/18/2016   Procedure: CESAREAN SECTION;  Surgeon: Harold Hedge, MD;  Location: [facility];  Service: Obstetrics;  Laterality: N/A;  . NO PAST SURGERIES     Family History: family history includes Alcohol abuse in her maternal grandfather and maternal grandmother; Dementia in her father; Depression in her father and mother; Diabetes in her father and paternal grandfather; Heart disease in her paternal grandfather; Hypothyroidism in her father and paternal grandmother; Migraines in her mother; Skin cancer in her paternal grandfather. Social History:  reports that she has never smoked. She has never used smokeless tobacco. She reports current alcohol use. She reports that she does not use drugs.     Maternal Diabetes: No Genetic Screening: Normal Maternal Ultrasounds/Referrals: Normal Fetal Ultrasounds or other Referrals:  None Maternal Substance Abuse:  No Significant Maternal Medications:  None Significant Maternal Lab Results:  None Other Comments:  None  Review of Systems  Eyes: Negative for blurred vision.  Gastrointestinal: Negative for abdominal pain.  Neurological: Negative for headaches.   History   Last menstrual period 04/17/2018, currently breastfeeding. Exam Physical Exam  Cardiovascular: Normal rate and regular rhythm.  Respiratory: Effort  normal.  GI: Soft.  Neurological: She has normal reflexes.    Prenatal labs: ABO, Rh: O/Negative/-- (09/26 0000) Antibody: Negative (09/26 0000) Rubella: Nonimmune (09/26 0000) RPR: Nonreactive (09/26 0000)  HBsAg: Negative (09/26 0000)  HIV: Non-reactive (09/26 0000)  GBS:   negative 12/18/18 Assessment/Plan: 38 yo G3P1 @ term for repeat cesarean section with BTL D/W risks including infection, organ damage, bleeding/transfusion-HIV/Hep, DVT/PE, pneumonia, return to OR. BTL reviewed including permanence, alternative contraception, failure rate and increased ectopic risk.   Jessica Villa II 01/09/2019, 9:09 AM

## 2019-01-10 ENCOUNTER — Encounter (HOSPITAL_COMMUNITY): Payer: Self-pay | Admitting: *Deleted

## 2019-01-10 ENCOUNTER — Inpatient Hospital Stay (HOSPITAL_COMMUNITY): Payer: 59 | Admitting: Certified Registered Nurse Anesthetist

## 2019-01-10 ENCOUNTER — Encounter (HOSPITAL_COMMUNITY)
Admission: RE | Disposition: A | Discharge status: Home / Self Care | Payer: Self-pay | Source: Home / Self Care | Attending: Obstetrics and Gynecology

## 2019-01-10 ENCOUNTER — Other Ambulatory Visit: Payer: Self-pay

## 2019-01-10 ENCOUNTER — Inpatient Hospital Stay (HOSPITAL_COMMUNITY)
Admission: RE | Admit: 2019-01-10 | Discharge: 2019-01-12 | DRG: 785 | Disposition: A | Discharge status: Home / Self Care | Payer: 59 | Attending: Obstetrics and Gynecology | Admitting: Obstetrics and Gynecology

## 2019-01-10 DIAGNOSIS — Z3A39 39 weeks gestation of pregnancy: Secondary | ICD-10-CM

## 2019-01-10 DIAGNOSIS — D649 Anemia, unspecified: Secondary | ICD-10-CM | POA: Diagnosis present

## 2019-01-10 DIAGNOSIS — O34211 Maternal care for low transverse scar from previous cesarean delivery: Principal | ICD-10-CM | POA: Diagnosis present

## 2019-01-10 DIAGNOSIS — E039 Hypothyroidism, unspecified: Secondary | ICD-10-CM | POA: Diagnosis present

## 2019-01-10 DIAGNOSIS — Z302 Encounter for sterilization: Secondary | ICD-10-CM

## 2019-01-10 DIAGNOSIS — O9902 Anemia complicating childbirth: Secondary | ICD-10-CM | POA: Diagnosis present

## 2019-01-10 DIAGNOSIS — O99214 Obesity complicating childbirth: Secondary | ICD-10-CM | POA: Diagnosis present

## 2019-01-10 DIAGNOSIS — E669 Obesity, unspecified: Secondary | ICD-10-CM | POA: Diagnosis present

## 2019-01-10 DIAGNOSIS — O99284 Endocrine, nutritional and metabolic diseases complicating childbirth: Secondary | ICD-10-CM | POA: Diagnosis present

## 2019-01-10 LAB — CBC
HCT: 34.7 % — ABNORMAL LOW (ref 36.0–46.0)
Hemoglobin: 11.7 g/dL — ABNORMAL LOW (ref 12.0–15.0)
MCH: 29.5 pg (ref 26.0–34.0)
MCHC: 33.7 g/dL (ref 30.0–36.0)
MCV: 87.4 fL (ref 80.0–100.0)
Platelets: 168 10*3/uL (ref 150–400)
RBC: 3.97 MIL/uL (ref 3.87–5.11)
RDW: 17.2 % — ABNORMAL HIGH (ref 11.5–15.5)
WBC: 10.8 10*3/uL — ABNORMAL HIGH (ref 4.0–10.5)
nRBC: 0 % (ref 0.0–0.2)

## 2019-01-10 LAB — RPR: RPR Ser Ql: NONREACTIVE

## 2019-01-10 SURGERY — Surgical Case
Anesthesia: Spinal | Site: Abdomen | Wound class: Clean Contaminated

## 2019-01-10 MED ORDER — NALBUPHINE HCL 10 MG/ML IJ SOLN: 5.0000 mg | Freq: Once | INTRAMUSCULAR | Status: DC | PRN

## 2019-01-10 MED ORDER — OXYTOCIN 40 UNITS IN NORMAL SALINE INFUSION - SIMPLE MED
INTRAVENOUS | Status: AC
  Filled 2019-01-10: qty 1000

## 2019-01-10 MED ORDER — OXYTOCIN 40 UNITS IN NORMAL SALINE INFUSION - SIMPLE MED: 2.5000 [IU]/h | INTRAVENOUS | Status: AC

## 2019-01-10 MED ORDER — IBUPROFEN 600 MG PO TABS
600.0000 mg | ORAL_TABLET | Freq: Four times a day (QID) | ORAL | Status: DC | PRN
  Administered 2019-01-10 – 2019-01-12 (×7): 600 mg via ORAL
  Filled 2019-01-10 (×7): qty 1

## 2019-01-10 MED ORDER — FENTANYL CITRATE (PF) 100 MCG/2ML IJ SOLN
INTRAMUSCULAR | Status: AC
  Filled 2019-01-10: qty 2

## 2019-01-10 MED ORDER — OXYCODONE HCL 5 MG PO TABS
5.0000 mg | ORAL_TABLET | ORAL | Status: DC | PRN
  Administered 2019-01-11 – 2019-01-12 (×5): 5 mg via ORAL
  Filled 2019-01-10 (×5): qty 1

## 2019-01-10 MED ORDER — PHENYLEPHRINE 40 MCG/ML (10ML) SYRINGE FOR IV PUSH (FOR BLOOD PRESSURE SUPPORT)
PREFILLED_SYRINGE | INTRAVENOUS | Status: AC
  Filled 2019-01-10: qty 10

## 2019-01-10 MED ORDER — MORPHINE SULFATE (PF) 0.5 MG/ML IJ SOLN
INTRAMUSCULAR | Status: DC | PRN
  Administered 2019-01-10: 150 ug via INTRATHECAL

## 2019-01-10 MED ORDER — MEPERIDINE HCL 25 MG/ML IJ SOLN: 6.2500 mg | INTRAMUSCULAR | Status: DC | PRN

## 2019-01-10 MED ORDER — TETANUS-DIPHTH-ACELL PERTUSSIS 5-2.5-18.5 LF-MCG/0.5 IM SUSP: 0.5000 mL | Freq: Once | INTRAMUSCULAR | Status: DC

## 2019-01-10 MED ORDER — OXYCODONE HCL 5 MG PO TABS: 5.0000 mg | ORAL_TABLET | Freq: Once | ORAL | Status: DC | PRN

## 2019-01-10 MED ORDER — DIPHENHYDRAMINE HCL 50 MG/ML IJ SOLN: 12.5000 mg | INTRAMUSCULAR | Status: DC | PRN

## 2019-01-10 MED ORDER — MENTHOL 3 MG MT LOZG: 1.0000 | LOZENGE | OROMUCOSAL | Status: DC | PRN

## 2019-01-10 MED ORDER — ONDANSETRON HCL 4 MG/2ML IJ SOLN
INTRAMUSCULAR | Status: DC | PRN
  Administered 2019-01-10: 4 mg via INTRAVENOUS

## 2019-01-10 MED ORDER — SOD CITRATE-CITRIC ACID 500-334 MG/5ML PO SOLN
30.0000 mL | ORAL | Status: AC
  Administered 2019-01-10: 30 mL via ORAL

## 2019-01-10 MED ORDER — ONDANSETRON HCL 4 MG/2ML IJ SOLN: 4.0000 mg | Freq: Three times a day (TID) | INTRAMUSCULAR | Status: DC | PRN

## 2019-01-10 MED ORDER — MORPHINE SULFATE (PF) 0.5 MG/ML IJ SOLN
INTRAMUSCULAR | Status: AC
  Filled 2019-01-10: qty 10

## 2019-01-10 MED ORDER — DEXAMETHASONE SODIUM PHOSPHATE 4 MG/ML IJ SOLN
INTRAMUSCULAR | Status: DC | PRN
  Administered 2019-01-10: 4 mg via INTRAVENOUS

## 2019-01-10 MED ORDER — PHENYLEPHRINE HCL-NACL 20-0.9 MG/250ML-% IV SOLN
INTRAVENOUS | Status: DC | PRN
  Administered 2019-01-10: 60 ug/min via INTRAVENOUS

## 2019-01-10 MED ORDER — CEFAZOLIN SODIUM-DEXTROSE 2-4 GM/100ML-% IV SOLN
2.0000 g | INTRAVENOUS | Status: AC
  Administered 2019-01-10: 08:00:00 2 g via INTRAVENOUS

## 2019-01-10 MED ORDER — OXYCODONE HCL 5 MG/5ML PO SOLN: 5.0000 mg | Freq: Once | ORAL | Status: DC | PRN

## 2019-01-10 MED ORDER — MEPERIDINE HCL 25 MG/ML IJ SOLN
INTRAMUSCULAR | Status: AC
  Filled 2019-01-10: qty 1

## 2019-01-10 MED ORDER — HYDROMORPHONE HCL 1 MG/ML IJ SOLN: 0.2500 mg | INTRAMUSCULAR | Status: DC | PRN

## 2019-01-10 MED ORDER — DEXAMETHASONE SODIUM PHOSPHATE 4 MG/ML IJ SOLN
INTRAMUSCULAR | Status: AC
  Filled 2019-01-10: qty 7

## 2019-01-10 MED ORDER — FENTANYL CITRATE (PF) 100 MCG/2ML IJ SOLN
INTRAMUSCULAR | Status: DC | PRN
  Administered 2019-01-10: 15 ug via INTRATHECAL

## 2019-01-10 MED ORDER — SOD CITRATE-CITRIC ACID 500-334 MG/5ML PO SOLN
ORAL | Status: AC
  Filled 2019-01-10: qty 15

## 2019-01-10 MED ORDER — SIMETHICONE 80 MG PO CHEW: 80.0000 mg | CHEWABLE_TABLET | ORAL | Status: DC | PRN

## 2019-01-10 MED ORDER — CITALOPRAM HYDROBROMIDE 20 MG PO TABS
40.0000 mg | ORAL_TABLET | Freq: Every day | ORAL | Status: DC
  Administered 2019-01-10 – 2019-01-11 (×2): 40 mg via ORAL
  Filled 2019-01-10 (×2): qty 2

## 2019-01-10 MED ORDER — NALBUPHINE HCL 10 MG/ML IJ SOLN: 5.0000 mg | INTRAMUSCULAR | Status: DC | PRN

## 2019-01-10 MED ORDER — OXYTOCIN 10 UNIT/ML IJ SOLN
INTRAMUSCULAR | Status: DC | PRN
  Administered 2019-01-10: 40 [IU] via INTRAMUSCULAR

## 2019-01-10 MED ORDER — PHENYLEPHRINE HCL (PRESSORS) 10 MG/ML IV SOLN
INTRAVENOUS | Status: DC | PRN
  Administered 2019-01-10: 80 ug via INTRAVENOUS

## 2019-01-10 MED ORDER — SCOPOLAMINE 1 MG/3DAYS TD PT72: 1.0000 | MEDICATED_PATCH | Freq: Once | TRANSDERMAL | Status: DC

## 2019-01-10 MED ORDER — KETOROLAC TROMETHAMINE 30 MG/ML IJ SOLN
INTRAMUSCULAR | Status: AC
  Filled 2019-01-10: qty 1

## 2019-01-10 MED ORDER — KETOROLAC TROMETHAMINE 30 MG/ML IJ SOLN
30.0000 mg | Freq: Once | INTRAMUSCULAR | Status: AC | PRN
  Administered 2019-01-10: 30 mg via INTRAVENOUS

## 2019-01-10 MED ORDER — SODIUM CHLORIDE 0.9% FLUSH: 3.0000 mL | INTRAVENOUS | Status: DC | PRN

## 2019-01-10 MED ORDER — ONDANSETRON HCL 4 MG/2ML IJ SOLN
INTRAMUSCULAR | Status: AC
  Filled 2019-01-10: qty 2

## 2019-01-10 MED ORDER — SODIUM CHLORIDE 0.9 % IV SOLN
INTRAVENOUS | Status: DC | PRN
  Administered 2019-01-10: 08:00:00 via INTRAVENOUS

## 2019-01-10 MED ORDER — METOCLOPRAMIDE HCL 5 MG/ML IJ SOLN
INTRAMUSCULAR | Status: AC
  Filled 2019-01-10: qty 2

## 2019-01-10 MED ORDER — PHENYLEPHRINE HCL-NACL 20-0.9 MG/250ML-% IV SOLN
INTRAVENOUS | Status: AC
  Filled 2019-01-10: qty 250

## 2019-01-10 MED ORDER — SCOPOLAMINE 1 MG/3DAYS TD PT72
MEDICATED_PATCH | TRANSDERMAL | Status: AC
  Filled 2019-01-10: qty 1

## 2019-01-10 MED ORDER — NALOXONE HCL 4 MG/10ML IJ SOLN
1.0000 ug/kg/h | INTRAVENOUS | Status: DC | PRN
  Filled 2019-01-10: qty 5

## 2019-01-10 MED ORDER — NALOXONE HCL 0.4 MG/ML IJ SOLN: 0.4000 mg | INTRAMUSCULAR | Status: DC | PRN

## 2019-01-10 MED ORDER — PROMETHAZINE HCL 25 MG/ML IJ SOLN: 6.2500 mg | INTRAMUSCULAR | Status: DC | PRN

## 2019-01-10 MED ORDER — METOCLOPRAMIDE HCL 5 MG/ML IJ SOLN
INTRAMUSCULAR | Status: DC | PRN
  Administered 2019-01-10: 10 mg via INTRAVENOUS

## 2019-01-10 MED ORDER — LACTATED RINGERS IV SOLN
INTRAVENOUS | Status: DC
  Administered 2019-01-10: 18:00:00 via INTRAVENOUS

## 2019-01-10 MED ORDER — DIPHENHYDRAMINE HCL 25 MG PO CAPS: 25.0000 mg | ORAL_CAPSULE | ORAL | Status: DC | PRN

## 2019-01-10 MED ORDER — BUPIVACAINE IN DEXTROSE 0.75-8.25 % IT SOLN
INTRATHECAL | Status: DC | PRN
  Administered 2019-01-10: 1.6 mL via INTRATHECAL

## 2019-01-10 MED ORDER — PRENATAL MULTIVITAMIN CH
1.0000 | ORAL_TABLET | Freq: Every day | ORAL | Status: DC
  Administered 2019-01-11: 1 via ORAL
  Filled 2019-01-10: qty 1

## 2019-01-10 MED ORDER — ACETAMINOPHEN 325 MG PO TABS
650.0000 mg | ORAL_TABLET | ORAL | Status: DC | PRN
  Administered 2019-01-10 – 2019-01-12 (×6): 650 mg via ORAL
  Filled 2019-01-10 (×6): qty 2

## 2019-01-10 MED ORDER — PANTOPRAZOLE SODIUM 40 MG PO TBEC
40.0000 mg | DELAYED_RELEASE_TABLET | Freq: Every day | ORAL | Status: DC
  Filled 2019-01-10 (×2): qty 1

## 2019-01-10 MED ORDER — ZOLPIDEM TARTRATE 5 MG PO TABS: 5.0000 mg | ORAL_TABLET | Freq: Every evening | ORAL | Status: DC | PRN

## 2019-01-10 MED ORDER — COCONUT OIL OIL
1.0000 "application " | TOPICAL_OIL | Status: DC | PRN
  Administered 2019-01-12: 1 via TOPICAL

## 2019-01-10 MED ORDER — SENNOSIDES-DOCUSATE SODIUM 8.6-50 MG PO TABS
2.0000 | ORAL_TABLET | ORAL | Status: DC
  Administered 2019-01-10 – 2019-01-11 (×2): 2 via ORAL
  Filled 2019-01-10 (×2): qty 2

## 2019-01-10 MED ORDER — LACTATED RINGERS IV SOLN
INTRAVENOUS | Status: DC
  Administered 2019-01-10 (×3): via INTRAVENOUS

## 2019-01-10 MED ORDER — WITCH HAZEL-GLYCERIN EX PADS: 1.0000 "application " | MEDICATED_PAD | CUTANEOUS | Status: DC | PRN

## 2019-01-10 MED ORDER — SCOPOLAMINE 1 MG/3DAYS TD PT72
MEDICATED_PATCH | TRANSDERMAL | Status: DC | PRN
  Administered 2019-01-10: 1 via TRANSDERMAL

## 2019-01-10 MED ORDER — LEVOTHYROXINE SODIUM 75 MCG PO TABS
150.0000 ug | ORAL_TABLET | Freq: Every day | ORAL | Status: DC
  Administered 2019-01-11 – 2019-01-12 (×2): 150 ug via ORAL
  Filled 2019-01-10 (×2): qty 2

## 2019-01-10 MED ORDER — SIMETHICONE 80 MG PO CHEW
80.0000 mg | CHEWABLE_TABLET | Freq: Three times a day (TID) | ORAL | Status: DC
  Administered 2019-01-10 – 2019-01-12 (×5): 80 mg via ORAL
  Filled 2019-01-10 (×6): qty 1

## 2019-01-10 MED ORDER — DIPHENHYDRAMINE HCL 25 MG PO CAPS: 25.0000 mg | ORAL_CAPSULE | Freq: Four times a day (QID) | ORAL | Status: DC | PRN

## 2019-01-10 MED ORDER — SIMETHICONE 80 MG PO CHEW
80.0000 mg | CHEWABLE_TABLET | ORAL | Status: DC
  Administered 2019-01-10 – 2019-01-11 (×2): 80 mg via ORAL
  Filled 2019-01-10 (×2): qty 1

## 2019-01-10 MED ORDER — CEFAZOLIN SODIUM-DEXTROSE 2-4 GM/100ML-% IV SOLN
INTRAVENOUS | Status: AC
  Filled 2019-01-10: qty 100

## 2019-01-10 MED ORDER — DIBUCAINE (PERIANAL) 1 % EX OINT: 1.0000 "application " | TOPICAL_OINTMENT | CUTANEOUS | Status: DC | PRN

## 2019-01-10 SURGICAL SUPPLY — 32 items
BENZOIN TINCTURE PRP APPL 2/3 (GAUZE/BANDAGES/DRESSINGS) ×2 IMPLANT
CLAMP CORD UMBIL (MISCELLANEOUS) IMPLANT
CLOTH BEACON ORANGE TIMEOUT ST (SAFETY) ×2 IMPLANT
DERMABOND ADVANCED (GAUZE/BANDAGES/DRESSINGS)
DERMABOND ADVANCED .7 DNX12 (GAUZE/BANDAGES/DRESSINGS) IMPLANT
DRSG OPSITE POSTOP 4X10 (GAUZE/BANDAGES/DRESSINGS) ×2 IMPLANT
ELECT REM PT RETURN 9FT ADLT (ELECTROSURGICAL) ×2
ELECTRODE REM PT RTRN 9FT ADLT (ELECTROSURGICAL) ×1 IMPLANT
EXTRACTOR VACUUM M CUP 4 TUBE (SUCTIONS) IMPLANT
GLOVE BIO SURGEON STRL SZ7.5 (GLOVE) ×2 IMPLANT
GLOVE BIOGEL PI IND STRL 7.0 (GLOVE) ×1 IMPLANT
GLOVE BIOGEL PI INDICATOR 7.0 (GLOVE) ×1
GOWN STRL REUS W/TWL LRG LVL3 (GOWN DISPOSABLE) ×4 IMPLANT
KIT ABG SYR 3ML LUER SLIP (SYRINGE) ×2 IMPLANT
NEEDLE HYPO 22GX1.5 SAFETY (NEEDLE) ×2 IMPLANT
NEEDLE HYPO 25X5/8 SAFETYGLIDE (NEEDLE) ×2 IMPLANT
NS IRRIG 1000ML POUR BTL (IV SOLUTION) ×2 IMPLANT
PACK C SECTION WH (CUSTOM PROCEDURE TRAY) ×2 IMPLANT
PAD OB MATERNITY 4.3X12.25 (PERSONAL CARE ITEMS) ×2 IMPLANT
PENCIL SMOKE EVAC W/HOLSTER (ELECTROSURGICAL) ×2 IMPLANT
STRIP CLOSURE SKIN 1/2X4 (GAUZE/BANDAGES/DRESSINGS) ×2 IMPLANT
SUT MNCRL 0 VIOLET CTX 36 (SUTURE) ×4 IMPLANT
SUT MONOCRYL 0 CTX 36 (SUTURE) ×4
SUT PDS AB 0 CTX 60 (SUTURE) ×2 IMPLANT
SUT PLAIN 0 NONE (SUTURE) IMPLANT
SUT PLAIN 2 0 (SUTURE)
SUT PLAIN 2 0 XLH (SUTURE) ×2 IMPLANT
SUT PLAIN ABS 2-0 CT1 27XMFL (SUTURE) IMPLANT
SUT VIC AB 4-0 KS 27 (SUTURE) ×2 IMPLANT
TOWEL OR 17X24 6PK STRL BLUE (TOWEL DISPOSABLE) ×2 IMPLANT
TRAY FOLEY W/BAG SLVR 14FR LF (SET/KITS/TRAYS/PACK) ×2 IMPLANT
WATER STERILE IRR 1000ML POUR (IV SOLUTION) ×2 IMPLANT

## 2019-01-10 NOTE — Progress Notes (Signed)
[name] was referred for history of depression/anxiety. * Referral screened out by Clinical Social Worker because none of the following criteria appear to apply: ~ History of anxiety/depression during this pregnancy, or of post-partum depression following prior delivery. ~ Diagnosis of anxiety and/or depression within last 3 years. [name] was also diagnosed with in 2015.  OR * [name]'s symptoms currently being treated with medication and/or therapy. Per chart review [name] is on Celexa 40 mg daily.  Please contact the Clinical Social Worker if needs arise, by [facility] request, or if [name] scores greater than 9/yes to question 10 on Edinburgh Postpartum Depression Screen.     Jessica Villa, MSW, LCSW-A [facility] [address]  [phone]

## 2019-01-10 NOTE — Transfer of Care (Signed)
Immediate Anesthesia Transfer of Care Note  Patient: Jessica Villa  Procedure(s) Performed: CESAREAN SECTION WITH BILATERAL TUBAL LIGATION (N/A Abdomen)  Patient Location: PACU  Anesthesia Type:Spinal  Level of Consciousness: awake, alert  and oriented  Airway & Oxygen Therapy: Patient Spontanous Breathing  Post-op Assessment: Report given to RN and Post -op Vital signs reviewed and stable  Post vital signs: Reviewed and stable  Last Vitals:  Vitals Value Taken Time  BP 94/58 01/10/2019  8:38 AM  Temp    Pulse 70 01/10/2019  8:40 AM  Resp 17 01/10/2019  8:40 AM  SpO2 100 % 01/10/2019  8:40 AM  Vitals shown include unvalidated device data.  Last Pain:  Vitals:   01/10/19 0635  TempSrc: Oral         Complications: No apparent anesthesia complications

## 2019-01-10 NOTE — Op Note (Signed)
NAMDarrol Angel: Hollon, Shakeema MEDICAL RECORD [id number] ACCOUNT [id number] DATE OF BIRTH:[birth year] FACILITY: [facility] LOCATION: [facility] PHYSICIAN:Buffey Zabinski E. Calem Cocozza II, MD  OPERATIVE REPORT  DATE OF PROCEDURE:  01/10/2019  PREOPERATIVE DIAGNOSES: 1.  Desires repeat cesarean section. 2.  Desires permanent sterilization.  POSTOPERATIVE DIAGNOSES: 1.  Desires repeat cesarean section. 2.  Desires permanent sterilization.  PROCEDURE: 1.  Repeat cesarean section. 2.  Bilateral tubal ligation.  SURGEON:  Harold HedgeJames Zaydenn Balaguer, II, M.D.  ASSISTANT:  Carolanne Grumblingracy Turner, RNFA.  ANESTHESIA:  Spinal.  ESTIMATED BLOOD LOSS:  Per anesthesia note.  FINDINGS:  Viable female infant.  Apgars, arterial cord pH, birth weight pending.  SPECIMENS:  Bilateral fallopian tube segments.  INDICATIONS AND CONSENT:  This patient is a 38 year old married white female with previous cesarean section.  She desires repeat.  She also desires permanent sterilization.  Potential risks and complications have been discussed preoperatively including  but not limited to infection, organ damage, bleeding requiring transfusion of blood products with HIV and hepatitis acquisition, DVT, PE, pneumonia.  Tubal ligation has been discussed.  Alternate methods of contraception have been discussed.  Permanence  has been emphasized as well as failure rate.  The patient states she understands and agrees and consent was signed on the chart.  DESCRIPTION OF PROCEDURE:  The patient was taken to the operating room where she was identified.  Spinal anesthetic was placed per anesthesiology and she was placed in the dorsal supine position with a 15-degree left lateral wedge.  She was then prepped  vaginally with Betadine.  Foley catheter was placed and she was prepped abdominally with ChloraPrep.  A timeout was undertaken.  After 3 minute drying time, she was draped in sterile fashion.  After testing for adequate spinal anesthesia, skin was  entered through  the previous Pfannenstiel scar.  Dissection was carried out in layers to the peritoneum which was entered and extended superiorly and inferiorly.  Vesicouterine peritoneum was taken down cephalad laterally.  Uterus was incised in a low  transverse manner and the uterine cavity was entered bluntly with a hemostat.  Clear fluid was noted.  The incision was extended with the fingers bilaterally.  The baby was delivered from the vertex position without difficulty.  Good cry and tone is  noted.  After 1 minute, the cord was clamped and cut and the baby was handed to the awaiting pediatrics team.  Placenta was manually delivered.  Uterine cavity is clean.  Uterus was closed in two running locking imbricating layers of 0 Monocryl suture,  which achieved good hemostasis.    Ovaries are normal bilaterally.  Left fallopian tube was identified from cornua to fimbria, grasped in its mid ampullary portion.  A knuckle of tube was then doubly ligated with 2 free ties of plain suture.  The intervening knuckle was then sharply  resected.  Cautery was used to assure hemostasis.  Similar procedure was carried out on the right side.  Lavage was carried out.  Anterior peritoneum was closed in a running fashion with 0 Monocryl suture, which was also used to reapproximate the  pyramidalis muscle in the midline.  The anterior rectus fascia was closed in a running fashion with 0 looped PDS.  Subcutaneous layer was closed with interrupted plain and the skin was closed in a subcuticular fashion with 4-0 Vicryl on a Keith needle.   Benzoin, Steri-Strips, honeycomb and pressure dressings are applied.    All counts were correct.    The patient was transferred to the recovery room in  stable condition.  AN/NUANCE  D:01/10/2019 T:01/10/2019 JOB:[id number]

## 2019-01-10 NOTE — Anesthesia Postprocedure Evaluation (Signed)
Anesthesia Post Note  Patient: Jessica Villa  Procedure(s) Performed: CESAREAN SECTION WITH BILATERAL TUBAL LIGATION (N/A Abdomen)     Patient location during evaluation: PACU Anesthesia Type: Spinal Level of consciousness: oriented and awake and alert Pain management: pain level controlled Vital Signs Assessment: post-procedure vital signs reviewed and stable Respiratory status: spontaneous breathing and respiratory function stable Cardiovascular status: blood pressure returned to baseline and stable Postop Assessment: no headache, no backache and no apparent nausea or vomiting Anesthetic complications: no    Last Vitals:  Vitals:   01/10/19 0945 01/10/19 1001  BP: 101/62 (!) 102/59  Pulse: (!) 54 (!) 49  Resp: 10 18  Temp:  (!) 36.2 C  SpO2: 100% 98%    Last Pain:  Vitals:   01/10/19 1001  TempSrc: Axillary  PainSc:    Pain Goal:    LLE Motor Response: Purposeful movement (01/10/19 0930) LLE Sensation: Tingling (01/10/19 0930) RLE Motor Response: Purposeful movement (01/10/19 0930) RLE Sensation: Tingling (01/10/19 0930)     Epidural/Spinal Function Cutaneous sensation: Tingles (01/10/19 0930), Patient able to flex knees: Yes (01/10/19 0930), Patient able to lift hips off bed: No (01/10/19 0930), Back pain beyond tenderness at insertion site: No (01/10/19 0930), Progressively worsening motor and/or sensory loss: No (01/10/19 0930), Bowel and/or bladder incontinence post epidural: No (01/10/19 0930)  Lowella Curb

## 2019-01-10 NOTE — Anesthesia Preprocedure Evaluation (Signed)
Anesthesia Evaluation  Patient identified by MRN, date of birth, ID band Patient awake    Reviewed: Allergy & Precautions, NPO status , Patient's Chart, lab work & pertinent test results  Airway Mallampati: III  TM Distance: >3 FB Neck ROM: Full    Dental no notable dental hx. (+) Teeth Intact   Pulmonary neg pulmonary ROS,    Pulmonary exam normal breath sounds clear to auscultation       Cardiovascular negative cardio ROS Normal cardiovascular exam Rhythm:Regular Rate:Normal     Neuro/Psych PSYCHIATRIC DISORDERS Depression negative neurological ROS     GI/Hepatic negative GI ROS, Neg liver ROS,   Endo/Other  Hypothyroidism Obesity  Renal/GU negative Renal ROS  negative genitourinary   Musculoskeletal negative musculoskeletal ROS (+)   Abdominal (+) + obese,   Peds  Hematology  (+) anemia ,   Anesthesia Other Findings   Reproductive/Obstetrics (+) Pregnancy                             Anesthesia Physical  Anesthesia Plan  ASA: II  Anesthesia Plan: Spinal   Post-op Pain Management:    Induction:   PONV Risk Score and Plan: 2 and Treatment may vary due to age or medical condition  Airway Management Planned: Natural Airway  Additional Equipment:   Intra-op Plan:   Post-operative Plan:   Informed Consent: I have reviewed the patients History and Physical, chart, labs and discussed the procedure including the risks, benefits and alternatives for the proposed anesthesia with the patient or authorized representative who has indicated his/her understanding and acceptance.       Plan Discussed with: Anesthesiologist  Anesthesia Plan Comments:         Anesthesia Quick Evaluation

## 2019-01-10 NOTE — Anesthesia Procedure Notes (Signed)
Spinal  Patient location during procedure: OB Start time: 01/10/2019 7:29 AM End time: 01/10/2019 7:34 AM Staffing Anesthesiologist: Lowella Curb, MD Performed: anesthesiologist  Preanesthetic Checklist Completed: patient identified, surgical consent, pre-op evaluation, timeout performed, IV checked, risks and benefits discussed and monitors and equipment checked Spinal Block Patient position: sitting Prep: site prepped and draped and DuraPrep Patient monitoring: heart rate, cardiac monitor, continuous pulse ox and blood pressure Approach: midline Location: L3-4 Injection technique: single-shot Needle Needle type: Pencan  Needle gauge: 24 G Needle length: 10 cm Assessment Sensory level: T4

## 2019-01-10 NOTE — Lactation Note (Signed)
This note was copied from a baby's chart. Lactation Consultation Note  Patient Name: Girl Scherri Messersmith [id number] Date: 01/10/2019 Reason for consult: Initial assessment;Term P2 Baby is 5 hours old.  Mom breastfed her first baby for over one year.  She states newborn is latching well.  Instructed on feeding cues and encouraged to feed with cues.  Mom will call out for assist as needed.  Maternal Data Does the patient have breastfeeding experience prior to this delivery?: Yes  Feeding Feeding Type: Breast Fed  LATCH Score                   Interventions    Lactation Tools Discussed/Used     Consult Status Consult Status: Follow-up Date: 01/11/19 Follow-up type: In-patient    Huston Foley 01/10/2019, 1:38 PM

## 2019-01-10 NOTE — Progress Notes (Signed)
No changes to H&P per patient history Reviewed procedure-repeat cesarean section and bilateral tubal ligation All questions answered Patient states she understands and agrees

## 2019-01-10 NOTE — Brief Op Note (Signed)
01/10/2019  8:19 AM  PATIENT:  Jessica Villa  38 y.o. female  PRE-OPERATIVE DIAGNOSIS:  previous X 1 ,  DESIRES STERILITY  POST-OPERATIVE DIAGNOSIS:  previous cesarean x1, desires sterility  PROCEDURE:  Procedure(s) with comments: CESAREAN SECTION WITH BILATERAL TUBAL LIGATION (N/A) - Repeat edc 4/29 NKDA Tracey RNFA  SURGEON:  Surgeon(s) and Role:    * Harold Hedge, MD - Primary  PHYSICIAN ASSISTANT:   ASSISTANTS: Eliott Nine RNFA   ANESTHESIA:   spinal  EBL:  Per anesthesia record   BLOOD ADMINISTERED:none  DRAINS: Urinary Catheter (Foley)   LOCAL MEDICATIONS USED:  NONE  SPECIMEN:  Source of Specimen:  bilateral fallopian tube segments  DISPOSITION OF SPECIMEN:  PATHOLOGY  COUNTS:  YES  TOURNIQUET:  * No tourniquets in log *  DICTATION: .Other Dictation: Dictation Number [id number]  PLAN OF CARE: Admit to inpatient   PATIENT DISPOSITION:  PACU - hemodynamically stable.   Delay start of Pharmacological VTE agent (>24hrs) due to surgical blood loss or risk of bleeding: not applicable

## 2019-01-11 LAB — CBC
HCT: 32.2 % — ABNORMAL LOW (ref 36.0–46.0)
Hemoglobin: 10.4 g/dL — ABNORMAL LOW (ref 12.0–15.0)
MCH: 29.1 pg (ref 26.0–34.0)
MCHC: 32.3 g/dL (ref 30.0–36.0)
MCV: 89.9 fL (ref 80.0–100.0)
Platelets: 152 10*3/uL (ref 150–400)
RBC: 3.58 MIL/uL — ABNORMAL LOW (ref 3.87–5.11)
RDW: 17.2 % — ABNORMAL HIGH (ref 11.5–15.5)
WBC: 10.5 10*3/uL (ref 4.0–10.5)
nRBC: 0 % (ref 0.0–0.2)

## 2019-01-11 LAB — BIRTH TISSUE RECOVERY COLLECTION (PLACENTA DONATION)

## 2019-01-11 MED ORDER — RHO D IMMUNE GLOBULIN 1500 UNIT/2ML IJ SOSY
300.0000 ug | PREFILLED_SYRINGE | Freq: Once | INTRAMUSCULAR | Status: AC
  Administered 2019-01-11: 300 ug via INTRAVENOUS
  Filled 2019-01-11: qty 2

## 2019-01-11 NOTE — Lactation Note (Signed)
This note was copied from a baby's chart. Lactation Consultation Note Baby 16 hrs old. Mom holding baby in cradle position stating baby BF well. Discussed positions. Mom BF her 1st child until molars came in then stated it was time to stop. Over 40 yrs old mom stated. Mom has everted nipples, baby latched well. Mom demonstrated easily expressed colostrum. Mom doing good documenting on I&O.  Encouraged to BF STS, baby had blanket around waist. Reviewed some newborn information on BF. Mom Thanked [facility] for reminders. Mom stated "You forget the newborn things after feeding a toddler. Encouraged to call if needs assistance or has questions. Mom has Lactation brochure.  Patient Name: Jessica Villa [id number] Date: 01/11/2019 Reason for consult: Initial assessment   Maternal Data Has patient been taught Hand Expression?: Yes Does the patient have breastfeeding experience prior to this delivery?: Yes  Feeding Feeding Type: Breast Fed  LATCH Score Latch: Grasps breast easily, tongue down, lips flanged, rhythmical sucking.  Audible Swallowing: Spontaneous and intermittent  Type of Nipple: Everted at rest and after stimulation  Comfort (Breast/Nipple): Soft / non-tender  Hold (Positioning): No assistance needed to correctly position infant at breast.  LATCH Score: 10  Interventions Interventions: Breast feeding basics reviewed;Position options;Support pillows;Hand express  Lactation Tools Discussed/Used     Consult Status Consult Status: Follow-up Date: 01/12/19 Follow-up type: In-patient    [name], Diamond Nickel 01/11/2019, 12:20 AM

## 2019-01-11 NOTE — Progress Notes (Signed)
Subjective: Postpartum Day 1: Cesarean Delivery Patient reports incisional pain, tolerating PO and no problems voiding.    Objective: Vital signs in last 24 hours: Temp:  [97.1 F (36.2 C)-98.4 F (36.9 C)] 97.6 F (36.4 C) (04/23 0509) Pulse Rate:  [40-54] 49 (04/23 0509) Resp:  [10-18] 18 (04/23 0509) BP: (91-121)/(51-62) 102/57 (04/23 0509) SpO2:  [98 %-100 %] 100 % (04/23 0509)  Physical Exam:  General: alert, cooperative, appears stated age and no distress Lochia: appropriate Uterine Fundus: firm Incision: healing well DVT Evaluation: No evidence of DVT seen on physical exam.  Recent Labs    01/10/19 0556 01/11/19 0538  HGB 11.7* 10.4*  HCT 34.7* 32.2*    Assessment/Plan: Status post Cesarean section. Doing well postoperatively.  Continue current care.  Turner Daniels 01/11/2019, 9:39 AM

## 2019-01-12 LAB — TYPE AND SCREEN
ABO/RH(D): O NEG
Antibody Screen: POSITIVE
Unit division: 0
Unit division: 0

## 2019-01-12 LAB — RH IG WORKUP (INCLUDES ABO/RH)
ABO/RH(D): O NEG
Fetal Screen: NEGATIVE
Gestational Age(Wks): 39
Unit division: 0

## 2019-01-12 LAB — BPAM RBC
Blood Product Expiration Date: 202005142359
Blood Product Expiration Date: 202005142359
Unit Type and Rh: 9500
Unit Type and Rh: 9500

## 2019-01-12 MED ORDER — MEASLES, MUMPS & RUBELLA VAC IJ SOLR
0.5000 mL | Freq: Once | INTRAMUSCULAR | Status: AC
  Administered 2019-01-12: 0.5 mL via SUBCUTANEOUS
  Filled 2019-01-12: qty 0.5

## 2019-01-12 MED ORDER — IBUPROFEN 600 MG PO TABS: 600.0000 mg | ORAL_TABLET | Freq: Four times a day (QID) | ORAL | 0 refills | Status: DC | PRN

## 2019-01-12 MED ORDER — OXYCODONE HCL 5 MG PO TABS: 5.0000 mg | ORAL_TABLET | ORAL | 0 refills | Status: DC | PRN

## 2019-01-12 NOTE — Discharge Summary (Signed)
Obstetric Discharge Summary Reason for Admission: cesarean section Prenatal Procedures: none Intrapartum Procedures: cesarean: low cervical, transverse Postpartum Procedures: none Complications-Operative and Postpartum: none Hemoglobin  Date Value Ref Range Status  01/11/2019 10.4 (L) 12.0 - 15.0 g/dL Final   HCT  Date Value Ref Range Status  01/11/2019 32.2 (L) 36.0 - 46.0 % Final    Physical Exam:  General: alert, cooperative and appears stated age 38: appropriate Uterine Fundus: firm Incision: healing well, no significant drainage, no dehiscence DVT Evaluation: No evidence of DVT seen on physical exam. Negative Homan's sign. No cords or calf tenderness.  Discharge Diagnoses: Term Pregnancy-delivered  Discharge Information: Date: 01/12/2019 Activity: pelvic rest Diet: routine Medications: PNV, Ibuprofen, Percocet and Synthroid, celexa Condition: stable Instructions: refer to practice specific booklet Discharge to: home   Newborn Data: Live born female  Birth Weight: 8 lb 3 oz (3714 g) APGAR: 8, 8  Newborn Delivery   Birth date/time:  01/10/2019 07:54:00 Delivery type:  C-Section, Low Transverse Trial of labor:  No C-section categorization:  Repeat     Home with mother.  Mitchel Honour 01/12/2019, 8:17 AM

## 2019-01-12 NOTE — Discharge Instructions (Signed)
Call MD for T>100.4, heavy vaginal bleeding, severe abdominal pain, or respiratory distress.  Call office to schedule incision check in 1-2 weeks.  Pelvic rest x 6 weeks.  No driving while taking narcotics.

## 2019-01-12 NOTE — Lactation Note (Signed)
This note was copied from a baby's chart. Lactation Consultation Note  Patient Name: Jessica Villa [id number] Date: 01/12/2019 Reason for consult: Follow-up assessment;Term Baby is 50 hours/6% weight loss.  Mom reports baby is breastfeeding well.  No concerns or questions.  Discussed milk coming to volume and the prevention and treatment of engorgement.  Manual pump given with instructions.  Reviewed outpatient services and support.  Encouraged to call prn.  Maternal Data    Feeding Feeding Type: Breast Fed  LATCH Score Latch: Grasps breast easily, tongue down, lips flanged, rhythmical sucking.  Audible Swallowing: Spontaneous and intermittent  Type of Nipple: Everted at rest and after stimulation  Comfort (Breast/Nipple): Filling, red/small blisters or bruises, mild/mod discomfort  Hold (Positioning): Assistance needed to correctly position infant at breast and maintain latch.  LATCH Score: 8  Interventions    Lactation Tools Discussed/Used     Consult Status Consult Status: Complete Follow-up type: Call as needed    Huston Foley 01/12/2019, 10:14 AM

## 2019-01-23 DIAGNOSIS — E039 Hypothyroidism, unspecified: Secondary | ICD-10-CM | POA: Diagnosis not present

## 2019-02-16 ENCOUNTER — Encounter: Payer: Self-pay | Admitting: Family

## 2019-02-20 NOTE — Telephone Encounter (Signed)
Request for labs faxed to [facility]

## 2019-02-21 NOTE — Telephone Encounter (Signed)
Left message for patient that I received her results but that I would like to assess her via video visit on Friday before deciding on dose.    Sheri, can you please schedule pt with me on Friday?

## 2019-02-23 ENCOUNTER — Other Ambulatory Visit: Payer: Self-pay

## 2019-02-23 ENCOUNTER — Encounter: Payer: Self-pay | Admitting: Family

## 2019-02-23 ENCOUNTER — Telehealth (INDEPENDENT_AMBULATORY_CARE_PROVIDER_SITE_OTHER): Payer: 59 | Admitting: Family

## 2019-02-23 ENCOUNTER — Telehealth: Payer: Self-pay

## 2019-02-23 DIAGNOSIS — E039 Hypothyroidism, unspecified: Secondary | ICD-10-CM

## 2019-02-23 MED ORDER — LEVOTHYROXINE SODIUM 175 MCG PO TABS: 175.0000 ug | ORAL_TABLET | Freq: Every day | ORAL | 1 refills | Status: DC

## 2019-02-23 NOTE — Telephone Encounter (Signed)
Copied from CRM [id number]. Topic: Appointment Scheduling - Scheduling Inquiry for Clinic >> Feb 22, 2019  4:04 PM Reggie Pile, NT wrote: Reason for CRM: Patient is calling in for an appointment tomorrow. Patient Call back is [phone].

## 2019-02-23 NOTE — Progress Notes (Signed)
Virtual Visit via Video Note  I connected with Jessica Villa on 02/23/19 at 10:20 AM EDT by a video enabled telemedicine application and verified that I am speaking with the correct person using two identifiers. This visit type was conducted due to national recommendations for restrictions regarding the COVID-19 Pandemic (e.g. social distancing).  This format is felt to be most appropriate for this patient at this time.   I discussed the limitations of evaluation and management by telemedicine and the availability of in person appointments. The patient expressed understanding and agreed to proceed.  Only the patient and myself were on today's video visit. The patient was at home and I was in my office at the time of today's visit.   History of Present Illness:  Patient presents today for follow up of her hypothyroidism. She reports that she gave birth to a healthy baby girl in April.  During her pregnancy her synthroid was adjusted several times and at one point was up to 250   mcg.  She has been on 150 mcg most recently and TSH at her follow up appointment on 01/24/19 was elevated at 5.96, T3 was low at 22.  Her synthroid was not adjusted by her OB/GYN.  She reports that she does feel fatigued but she also has a newborn baby so she isn't sure if that is the reason for her fatigue.  Past Medical History:  Diagnosis Date  . Anemia   . Depression   . Eczema   . History of ovarian cyst   . Hx of varicella   . Hypothyroidism   . Thyroid disease    hypothyroid     Social History   Socioeconomic History  . Marital status: Married    Spouse name: Not on file  . Number of children: Not on file  . Years of education: Not on file  . Highest education level: Not on file  Occupational History  . Not on file  Social Needs  . Financial resource strain: Not hard at all  . Food insecurity:    Worry: Never true    Inability: Never true  . Transportation needs:    Medical: No    Non-medical: Not  on file  Tobacco Use  . Smoking status: Never Smoker  . Smokeless tobacco: Never Used  Substance and Sexual Activity  . Alcohol use: Yes    Comment: 1 cocktail or wine per month none since pregnancy  . Drug use: No  . Sexual activity: Yes  Lifestyle  . Physical activity:    Days per week: Not on file    Minutes per session: Not on file  . Stress: Only a little  Relationships  . Social connections:    Talks on phone: Not on file    Gets together: Not on file    Attends religious service: Not on file    Active member of club or organization: Not on file    Attends meetings of clubs or organizations: Not on file    Relationship status: Not on file  . Intimate partner violence:    Fear of current or ex partner: No    Emotionally abused: No    Physically abused: No    Forced sexual activity: No  Other Topics Concern  . Not on file  Social History Narrative   2 step children ( 6year and 40 year old) lives with pt and her husband.    1 son born 2017   Stay at home mom  Has masters degree in education leadership   Has worked as a Dance movement psychotherapistteacher   Enjoys hiking   Crafts, crocheting, no pets       Past Surgical History:  Procedure Laterality Date  . CESAREAN SECTION N/A 02/18/2016   Procedure: CESAREAN SECTION;  Surgeon: Harold HedgeJames Tomblin, MD;  [facility];  Service: Obstetrics;  Laterality: N/A;  . CESAREAN SECTION WITH BILATERAL TUBAL LIGATION N/A 01/10/2019   Procedure: CESAREAN SECTION WITH BILATERAL TUBAL LIGATION;  Surgeon: Harold Hedgeomblin, James, MD;  Location: [facility] LD ORS;  Service: Obstetrics;  Laterality: N/A;  Repeat edc 4/29 NKDA Tracey RNFA  . NO PAST SURGERIES      Family History  Problem Relation Age of Onset  . Depression Mother   . Migraines Mother   . Dementia Father   . Diabetes Father   . Depression Father   . Hypothyroidism Father   . Alcohol abuse Maternal Grandmother   . Alcohol abuse Maternal Grandfather   . Hypothyroidism Paternal Grandmother    . Heart disease Paternal Grandfather   . Diabetes Paternal Grandfather   . Skin cancer Paternal Grandfather     No Known Allergies  Current Outpatient Medications on File Prior to Visit  Medication Sig Dispense Refill  . citalopram (CELEXA) 40 MG tablet TAKE 1 TABLET BY MOUTH EVERY DAY (Patient taking differently: Take 40 mg by mouth every evening. ) 90 tablet 1  . ferrous sulfate 325 (65 FE) MG tablet Take 325 mg by mouth daily with breakfast.    . ibuprofen (ADVIL) 600 MG tablet Take 1 tablet (600 mg total) by mouth every 6 (six) hours as needed for moderate pain. 30 tablet 0  . oxyCODONE (OXY IR/ROXICODONE) 5 MG immediate release tablet Take 1-2 tablets (5-10 mg total) by mouth every 4 (four) hours as needed for moderate pain. 30 tablet 0  . pantoprazole (PROTONIX) 40 MG tablet Take 40 mg by mouth daily.    . Prenatal Vit-Fe Fumarate-FA (PRENATAL MULTIVITAMIN) TABS tablet Take 1 tablet by mouth daily at 12 noon.     No current facility-administered medications on file prior to visit.     LMP 04/17/2018    Observations/Objective:   Gen: Awake, alert, no acute distress Resp: Breathing is even and non-labored Psych: calm/pleasant demeanor Neuro: Alert and Oriented x 3, + facial symmetry, speech is clear.  Assessment and Plan:  Hypothyroid- TSH is elevated. Will increase synthroid  From 150 mcg to 175 mcg once daily.  Plan to repeat TSH in 1 month and see her back for cpx in August as scheduled.   Follow Up Instructions:    I discussed the assessment and treatment plan with the patient. The patient was provided an opportunity to ask questions and all were answered. The patient agreed with the plan and demonstrated an understanding of the instructions.   The patient was advised to call back or seek an in-person evaluation if the symptoms worsen or if the condition fails to improve as anticipated.    Lemont FillersMelissa S O'Sullivan, NP

## 2019-03-05 ENCOUNTER — Other Ambulatory Visit: Payer: Self-pay | Admitting: Family

## 2019-04-23 ENCOUNTER — Encounter: Payer: BLUE CROSS/BLUE SHIELD | Admitting: Family

## 2019-04-23 LAB — HM PAP SMEAR: HM Pap smear: NEGATIVE

## 2019-04-25 ENCOUNTER — Telehealth: Payer: Self-pay | Admitting: Family

## 2019-04-25 NOTE — Telephone Encounter (Signed)
Hi, lets offer her Tuesday 8/11 at 11:40 or 3PM please.

## 2019-04-26 NOTE — Telephone Encounter (Signed)
Thanks she took the 3pm

## 2019-05-01 ENCOUNTER — Ambulatory Visit (INDEPENDENT_AMBULATORY_CARE_PROVIDER_SITE_OTHER): Payer: 59 | Admitting: Family

## 2019-05-01 ENCOUNTER — Telehealth: Payer: Self-pay | Admitting: Family

## 2019-05-01 ENCOUNTER — Encounter: Payer: Self-pay | Admitting: Family

## 2019-05-01 ENCOUNTER — Other Ambulatory Visit: Payer: Self-pay

## 2019-05-01 VITALS — BP 119/57 | HR 73 | Temp 97.1°F | Resp 16 | Ht 66.0 in | Wt 208.0 lb

## 2019-05-01 DIAGNOSIS — Z0000 Encounter for general adult medical examination without abnormal findings: Secondary | ICD-10-CM

## 2019-05-01 DIAGNOSIS — Z111 Encounter for screening for respiratory tuberculosis: Secondary | ICD-10-CM

## 2019-05-01 NOTE — Telephone Encounter (Signed)
Records release request faxed for pap report

## 2019-05-01 NOTE — Progress Notes (Signed)
Subjective:    Patient ID: Jessica Villa, female    DOB: 13-Jul-1981, 38 y.o.   MRN: [id number]  HPI   Patient presents today for complete physical.  Immunizations:  Needs flu shot  Diet: will begin Noom, she has lost 10 pounds.  She has a 773.345 month old female Wt Readings from Last 3 Encounters:  05/01/19 208 lb (94.3 kg)  01/10/19 245 lb (111.1 kg)  12/28/18 240 lb (108.9 kg)  Exercise: plans to start Pap Smear: up to date Mammogram: breastfeeding     Review of Systems  Constitutional: Negative for unexpected weight change.  HENT: Negative for rhinorrhea.   Eyes: Negative for visual disturbance.  Respiratory: Negative for cough.   Cardiovascular: Negative for leg swelling.  Gastrointestinal: Negative for constipation and diarrhea.  Genitourinary: Negative for dysuria, genital sores and hematuria.  Musculoskeletal: Negative for arthralgias and myalgias.  Skin: Negative for rash.  Neurological: Negative for headaches (occasional mild Ha's which she attributes to dehydration).  Hematological: Negative for adenopathy.  Psychiatric/Behavioral:       Denies depression   Past Medical History:  Diagnosis Date  . Anemia   . Depression   . Eczema   . History of ovarian cyst   . Hx of varicella   . Hypothyroidism   . Thyroid disease    hypothyroid     Social History   Socioeconomic History  . Marital status: Married    Spouse name: Not on file  . Number of children: Not on file  . Years of education: Not on file  . Highest education level: Not on file  Occupational History  . Not on file  Social Needs  . Financial resource strain: Not hard at all  . Food insecurity    Worry: Never true    Inability: Never true  . Transportation needs    Medical: No    Non-medical: Not on file  Tobacco Use  . Smoking status: Never Smoker  . Smokeless tobacco: Never Used  Substance and Sexual Activity  . Alcohol use: Yes    Comment: 1 cocktail or wine per month none since  pregnancy  . Drug use: No  . Sexual activity: Yes  Lifestyle  . Physical activity    Days per week: Not on file    Minutes per session: Not on file  . Stress: Only a little  Relationships  . Social Musicianconnections    Talks on phone: Not on file    Gets together: Not on file    Attends religious service: Not on file    Active member of club or organization: Not on file    Attends meetings of clubs or organizations: Not on file    Relationship status: Not on file  . Intimate partner violence    Fear of current or ex partner: No    Emotionally abused: No    Physically abused: No    Forced sexual activity: No  Other Topics Concern  . Not on file  Social History Narrative   2 step children ( 6year and [id number] year old) lives with pt and her husband.    1 son born 2017   Stay at home mom   Has masters degree in education leadership   Has worked as a Dance movement psychotherapistteacher   Enjoys hiking   Crafts, crocheting, no pets       Past Surgical History:  Procedure Laterality Date  . CESAREAN SECTION N/A 02/18/2016   Procedure: CESAREAN SECTION;  Surgeon:  Everlene Farrier, MD;  Location: [facility];  Service: Obstetrics;  Laterality: N/A;  . CESAREAN SECTION WITH BILATERAL TUBAL LIGATION N/A 01/10/2019   Procedure: CESAREAN SECTION WITH BILATERAL TUBAL LIGATION;  Surgeon: Everlene Farrier, MD;  Location: [facility];  Service: Obstetrics;  Laterality: N/A;  Repeat edc 4/29 NKDA Tracey RNFA  . NO PAST SURGERIES      Family History  Problem Relation Age of Onset  . Depression Mother   . Migraines Mother   . Dementia Father   . Diabetes Father   . Depression Father   . Hypothyroidism Father   . Alcohol abuse Maternal Grandmother   . Alcohol abuse Maternal Grandfather   . Hypothyroidism Paternal Grandmother   . Heart disease Paternal Grandfather   . Diabetes Paternal Grandfather   . Skin cancer Paternal Grandfather     No Known Allergies  Current Outpatient Medications on File Prior to Visit   Medication Sig Dispense Refill  . citalopram (CELEXA) 40 MG tablet TAKE 1 TABLET BY MOUTH EVERY DAY 90 tablet 1  . levothyroxine (SYNTHROID) 175 MCG tablet Take 1 tablet (175 mcg total) by mouth daily before breakfast. 30 tablet 1   No current facility-administered medications on file prior to visit.     BP (!) 119/57 (BP Location: Right Arm, Patient Position: Sitting, Cuff Size: Small)   Pulse 73   Temp (!) 97.1 F (36.2 C) (Temporal)   Resp 16   Ht 5\' 6"  (1.676 m)   Wt 208 lb (94.3 kg)   SpO2 100%   BMI 33.57 kg/m       Objective:   Physical Exam   Physical Exam  Constitutional: She is oriented to person, place, and time. She appears well-developed and well-nourished. No distress.  HENT:  Head: Normocephalic and atraumatic.  Right Ear: Tympanic membrane and ear canal normal.  Left Ear: Tympanic membrane and ear canal normal.  Mouth/Throat: not examined- wearing mask Eyes: Pupils are equal, round, and reactive to light. No scleral icterus.  Neck: Normal range of motion. No thyromegaly present.  Cardiovascular: Normal rate and regular rhythm.   No murmur heard. Pulmonary/Chest: Effort normal and breath sounds normal. No respiratory distress. He has no wheezes. She has no rales. She exhibits no tenderness.  Abdominal: Soft. Bowel sounds are normal. She exhibits no distension and no mass. There is no tenderness. There is no rebound and no guarding.  Musculoskeletal: She exhibits no edema.  Lymphadenopathy:    She has no cervical adenopathy.  Neurological: She is alert and oriented to person, place, and time. She has normal patellar reflexes. She exhibits normal muscle tone. Coordination normal.  Skin: Skin is warm and dry.  Psychiatric: She has a normal mood and affect. Her behavior is normal. Judgment and thought content normal.         Assessment & Plan:   Preventative care- discussed healthy diet, exercise.  Pap/immunizations reviewed and up to date. Needs form  filled for job (they want TB screening- TB gold ordered)     Assessment & Plan:

## 2019-05-01 NOTE — Telephone Encounter (Signed)
Please contact Dr. Sherlynn Stalls office and request copy of pap report.

## 2019-05-01 NOTE — Patient Instructions (Signed)
Please complete lab work prior to leaving.   

## 2019-05-02 LAB — BASIC METABOLIC PANEL
BUN: 14 mg/dL (ref 6–23)
CO2: 27 mEq/L (ref 19–32)
Calcium: 9.5 mg/dL (ref 8.4–10.5)
Chloride: 105 mEq/L (ref 96–112)
Creatinine, Ser: 0.71 mg/dL (ref 0.40–1.20)
GFR: 90.95 mL/min (ref >60.00)
Glucose, Bld: 91 mg/dL (ref 70–99)
Potassium: 4.3 mEq/L (ref 3.5–5.1)
Sodium: 140 mEq/L (ref 135–145)

## 2019-05-02 LAB — CBC WITH DIFFERENTIAL/PLATELET
Basophils Absolute: 0.1 10*3/uL (ref 0.0–0.1)
Basophils Relative: 0.8 % (ref 0.0–3.0)
Eosinophils Absolute: 0.2 10*3/uL (ref 0.0–0.7)
Eosinophils Relative: 3.3 % (ref 0.0–5.0)
HCT: 38.8 % (ref 36.0–46.0)
Hemoglobin: 12.8 g/dL (ref 12.0–15.0)
Lymphocytes Relative: 27.5 % (ref 12.0–46.0)
Lymphs Abs: 2.1 10*3/uL (ref 0.7–4.0)
MCHC: 32.9 g/dL (ref 30.0–36.0)
MCV: 91.4 fl (ref 78.0–100.0)
Monocytes Absolute: 0.8 10*3/uL (ref 0.1–1.0)
Monocytes Relative: 11.4 % (ref 3.0–12.0)
Neutro Abs: 4.2 10*3/uL (ref 1.4–7.7)
Neutrophils Relative %: 57 % (ref 43.0–77.0)
Platelets: 248 10*3/uL (ref 150.0–400.0)
RBC: 4.25 Mil/uL (ref 3.87–5.11)
RDW: 13.4 % (ref 11.5–15.5)
WBC: 7.5 10*3/uL (ref 4.0–10.5)

## 2019-05-02 LAB — LIPID PANEL
Cholesterol: 149 mg/dL (ref 0–200)
HDL: 51 mg/dL (ref >39.00)
LDL Cholesterol: 91 mg/dL (ref 0–99)
NonHDL: 98.27
Total CHOL/HDL Ratio: 3
Triglycerides: 35 mg/dL (ref 0.0–149.0)
VLDL: 7 mg/dL (ref 0.0–40.0)

## 2019-05-02 LAB — HEPATIC FUNCTION PANEL
ALT: 19 U/L (ref 0–35)
AST: 22 U/L (ref 0–37)
Albumin: 4.1 g/dL (ref 3.5–5.2)
Alkaline Phosphatase: 70 U/L (ref 39–117)
Bilirubin, Direct: 0.1 mg/dL (ref 0.0–0.3)
Total Bilirubin: 0.4 mg/dL (ref 0.2–1.2)
Total Protein: 6.6 g/dL (ref 6.0–8.3)

## 2019-05-02 LAB — TSH: TSH: 0.18 u[IU]/mL — ABNORMAL LOW (ref 0.35–4.50)

## 2019-05-03 ENCOUNTER — Telehealth: Payer: Self-pay | Admitting: Family

## 2019-05-03 DIAGNOSIS — E039 Hypothyroidism, unspecified: Secondary | ICD-10-CM

## 2019-05-03 LAB — QUANTIFERON-TB GOLD PLUS
Mitogen-NIL: 8.01 IU/mL
NIL: 0.04 IU/mL
QuantiFERON-TB Gold Plus: NEGATIVE
TB1-NIL: <0 IU/mL
TB2-NIL: <0 IU/mL

## 2019-05-03 MED ORDER — LEVOTHYROXINE SODIUM 150 MCG PO TABS: 150.0000 ug | ORAL_TABLET | Freq: Every day | ORAL | 3 refills | Status: DC

## 2019-05-03 NOTE — Telephone Encounter (Signed)
Lvm with this information and for patient to call back if any questions or concerns

## 2019-05-03 NOTE — Telephone Encounter (Signed)
Lab work shows that synthroid is a bit strong for her.  I would like her to decrease

## 2019-05-04 ENCOUNTER — Encounter: Payer: Self-pay | Admitting: Family

## 2019-05-06 NOTE — Telephone Encounter (Signed)
Rod Holler, see mychart- fyi

## 2019-05-25 ENCOUNTER — Encounter: Payer: 59 | Admitting: Family

## 2019-06-01 ENCOUNTER — Encounter: Payer: Self-pay | Admitting: Family

## 2019-06-05 ENCOUNTER — Ambulatory Visit (INDEPENDENT_AMBULATORY_CARE_PROVIDER_SITE_OTHER): Payer: BC Managed Care – PPO | Admitting: Family

## 2019-06-05 ENCOUNTER — Other Ambulatory Visit: Payer: Self-pay

## 2019-06-05 ENCOUNTER — Encounter: Payer: Self-pay | Admitting: Family

## 2019-06-05 ENCOUNTER — Ambulatory Visit: Payer: 59

## 2019-06-05 VITALS — BP 128/64 | HR 71 | Temp 97.0°F | Resp 16 | Ht 66.0 in | Wt 204.0 lb

## 2019-06-05 DIAGNOSIS — G43009 Migraine without aura, not intractable, without status migrainosus: Secondary | ICD-10-CM | POA: Diagnosis not present

## 2019-06-05 DIAGNOSIS — Z23 Encounter for immunization: Secondary | ICD-10-CM | POA: Diagnosis not present

## 2019-06-05 NOTE — Progress Notes (Signed)
Subjective:    Patient ID: Jessica Villa, female    DOB: [birth year], 38 y.o.   MRN: [id number]  HPI  Patient is a 38 yr old female who presents today with chief complaint of headaches.   Reports that mother has migraines.    Reports that headaches started about 1 week ago.  Reports that her sleep is interrupted due to infant feedings.  She reports that she has HA in the front. Reports that there left eye feels like a dull pain around the top and the back of the left eye. When headache is at its worst the left eyelid will droop.  Denies any changes in vision.   She is still nursing.   Review of Systems See HPI  Past Medical History:  Diagnosis Date  . Anemia   . Depression   . Eczema   . History of ovarian cyst   . Hx of varicella   . Hypothyroidism   . Thyroid disease    hypothyroid     Social History   Socioeconomic History  . Marital status: Married    Spouse name: Not on file  . Number of children: Not on file  . Years of education: Not on file  . Highest education level: Not on file  Occupational History  . Not on file  Social Needs  . Financial resource strain: Not hard at all  . Food insecurity    Worry: Never true    Inability: Never true  . Transportation needs    Medical: No    Non-medical: Not on file  Tobacco Use  . Smoking status: Never Smoker  . Smokeless tobacco: Never Used  Substance and Sexual Activity  . Alcohol use: Yes    Comment: 1 cocktail or wine per month none since pregnancy  . Drug use: No  . Sexual activity: Yes  Lifestyle  . Physical activity    Days per week: Not on file    Minutes per session: Not on file  . Stress: Only a little  Relationships  . Social Herbalist on phone: Not on file    Gets together: Not on file    Attends religious service: Not on file    Active member of club or organization: Not on file    Attends meetings of clubs or organizations: Not on file    Relationship status: Not on file  .  Intimate partner violence    Fear of current or ex partner: No    Emotionally abused: No    Physically abused: No    Forced sexual activity: No  Other Topics Concern  . Not on file  Social History Narrative   2 step children ( 6year and 40 year old) lives with pt and her husband.    1 son born 2017   Stay at home mom   Has masters degree in education leadership   Has worked as a Education officer, museum, crocheting, no pets       Past Surgical History:  Procedure Laterality Date  . CESAREAN SECTION N/A 02/18/2016   Procedure: CESAREAN SECTION;  Surgeon: Everlene Farrier, MD;  Location: [facility];  Service: Obstetrics;  Laterality: N/A;  . CESAREAN SECTION WITH BILATERAL TUBAL LIGATION N/A 01/10/2019   Procedure: CESAREAN SECTION WITH BILATERAL TUBAL LIGATION;  Surgeon: Everlene Farrier, MD;  Location: [facility];  Service: Obstetrics;  Laterality: N/A;  Repeat edc 4/29 NKDA Tracey RNFA  .  NO PAST SURGERIES      Family History  Problem Relation Age of Onset  . Depression Mother   . Migraines Mother   . Dementia Father   . Diabetes Father   . Depression Father   . Hypothyroidism Father   . Alcohol abuse Maternal Grandmother   . Alcohol abuse Maternal Grandfather   . Hypothyroidism Paternal Grandmother   . Heart disease Paternal Grandfather   . Diabetes Paternal Grandfather   . Skin cancer Paternal Grandfather     No Known Allergies  Current Outpatient Medications on File Prior to Visit  Medication Sig Dispense Refill  . citalopram (CELEXA) 40 MG tablet TAKE 1 TABLET BY MOUTH EVERY DAY 90 tablet 1  . levothyroxine (SYNTHROID) 150 MCG tablet Take 1 tablet (150 mcg total) by mouth daily before breakfast. 30 tablet 3   No current facility-administered medications on file prior to visit.     BP 128/64 (BP Location: Right Arm, Patient Position: Sitting, Cuff Size: Small)   Pulse 71   Temp (!) 97 F (36.1 C) (Temporal)   Resp 16   Ht 5\' 6"  (1.676 m)    Wt 204 lb (92.5 kg)   SpO2 100%   BMI 32.93 kg/m       Objective:   Physical Exam Constitutional:      Appearance: She is well-developed.  Eyes:     Extraocular Movements: Extraocular movements intact.     Pupils: Pupils are equal, round, and reactive to light.  Cardiovascular:     Rate and Rhythm: Normal rate and regular rhythm.     Heart sounds: Normal heart sounds. No murmur.  Pulmonary:     Effort: Pulmonary effort is normal. No respiratory distress.     Breath sounds: Normal breath sounds. No wheezing.  Neurological:     Cranial Nerves: No cranial nerve deficit or facial asymmetry.  Psychiatric:        Behavior: Behavior normal.        Thought Content: Thought content normal.        Judgment: Judgment normal.           Assessment & Plan:  Migraine- we are limited in our use of prophylactic meds. Review of imitex says that a dose can be given and hold breast feeding x 12 hrs. However she wishes to avoid medication. Recommended that she buys some computer glasses (she is now teaching online many hours a day) and that she try to increase her rest. She will let me know if she develops new/worsening symptoms.

## 2019-06-05 NOTE — Patient Instructions (Signed)
Please call if symptoms worsen or if they fail to improve.

## 2019-06-06 DIAGNOSIS — Z23 Encounter for immunization: Secondary | ICD-10-CM | POA: Diagnosis not present

## 2019-06-06 DIAGNOSIS — G43009 Migraine without aura, not intractable, without status migrainosus: Secondary | ICD-10-CM | POA: Diagnosis not present

## 2019-06-06 NOTE — Addendum Note (Signed)
Addended by: Jiles Prows on: 06/06/2019 10:01 AM   Modules accepted: Orders

## 2019-09-06 ENCOUNTER — Other Ambulatory Visit: Payer: Self-pay | Admitting: Family

## 2019-09-06 NOTE — Telephone Encounter (Signed)
Last OV 06/05/19 Last refill 05/03/19 #30/3 Next OV 05/02/20

## 2019-10-01 ENCOUNTER — Other Ambulatory Visit: Payer: Self-pay | Admitting: Family

## 2019-11-27 ENCOUNTER — Telehealth: Payer: Self-pay | Admitting: Family

## 2019-11-27 NOTE — Telephone Encounter (Signed)
Sounds like her symptoms are most likely due to the vaccine. If symptoms worsen she should book a virtual visit. Otherwise, let me know if symptoms are not improved in 1 week. OK to use tylenol as needed for pain/HA.    Is she still nursing? If not, ok to send pended rx for zofran to help with nausea.

## 2019-11-27 NOTE — Telephone Encounter (Signed)
Patient states that she had Covid Vaccine shot on Wednesday March 3,2021 and is having Dizziness,Nausea, Lost of Focus.   Please Advise

## 2019-11-27 NOTE — Telephone Encounter (Signed)
Patient reports "bad headache day of the vaccine with some nausea. Symptoms have continued on and off since". Patient reports headaches, nausea diarrhea, chills and fatigue.

## 2019-11-28 NOTE — Telephone Encounter (Signed)
Patient feeling a little better today, she is nursing and does not want to take the zofran. She will call back for virtual visit if still sick in a few more days.

## 2019-12-04 ENCOUNTER — Telehealth: Payer: BC Managed Care – PPO | Admitting: Nurse Practitioner

## 2019-12-04 DIAGNOSIS — J069 Acute upper respiratory infection, unspecified: Secondary | ICD-10-CM | POA: Diagnosis not present

## 2019-12-04 DIAGNOSIS — T50Z95A Adverse effect of other vaccines and biological substances, initial encounter: Secondary | ICD-10-CM | POA: Diagnosis not present

## 2019-12-04 NOTE — Progress Notes (Signed)
We are sorry you are not feeling well.  Here is how we plan to help!  Based on what you have shared with me, it looks like you may have a viral upper respiratory infection.  Upper respiratory infections are caused by a large number of viruses; however, rhinovirus is the most common cause.   * the covid vaccine can cause uri like symptoms that can persist for several weeks. All you can do is take motrin or Tylenol OTC along with an OC decongestant. I wish there was more I could do but it is a viral reaction and just has to run its course. Know that may be worse with second vaccine.   Symptoms vary from person to person, with common symptoms including sore throat, cough, fatigue or lack of energy and feeling of general discomfort.  A low-grade fever of up to 100.4 may present, but is often uncommon.  Symptoms vary however, and are closely related to a person's age or underlying illnesses.  The most common symptoms associated with an upper respiratory infection are nasal discharge or congestion, cough, sneezing, headache and pressure in the ears and face.  These symptoms usually persist for about 3 to 10 days, but can last up to 2 weeks.  It is important to know that upper respiratory infections do not cause serious illness or complications in most cases.    Upper respiratory infections can be transmitted from person to person, with the most common method of transmission being a person's hands.  The virus is able to live on the skin and can infect other persons for up to 2 hours after direct contact.  Also, these can be transmitted when someone coughs or sneezes; thus, it is important to cover the mouth to reduce this risk.  To keep the spread of the illness at bay, good hand hygiene is very important.  This is an infection that is most likely caused by a virus. There are no specific treatments other than to help you with the symptoms until the infection runs its course.  We are sorry you are not feeling  well.  Here is how we plan to help!   For nasal congestion, you may use an oral decongestants such as Mucinex D or if you have glaucoma or high blood pressure use plain Mucinex.  Saline nasal spray or nasal drops can help and can safely be used as often as needed for congestion.  If you do not have a history of heart disease, hypertension, diabetes or thyroid disease, prostate/bladder issues or glaucoma, you may also use Sudafed to treat nasal congestion.  It is highly recommended that you consult with a pharmacist or your primary care physician to ensure this medication is safe for you to take.     If you have a cough, you may use cough suppressants such as Delsym and Robitussin.  If you have glaucoma or high blood pressure, you can also use Coricidin HBP.     If you have a sore or scratchy throat, use a saltwater gargle-  to  teaspoon of salt dissolved in a 4-ounce to 8-ounce glass of warm water.  Gargle the solution for approximately 15-30 seconds and then spit.  It is important not to swallow the solution.  You can also use throat lozenges/cough drops and Chloraseptic spray to help with throat pain or discomfort.  Warm or cold liquids can also be helpful in relieving throat pain.  For headache, pain or general discomfort, you can use Ibuprofen  or Tylenol as directed.   Some authorities believe that zinc sprays or the use of Echinacea may shorten the course of your symptoms.   HOME CARE . Only take medications as instructed by your medical team. . Be sure to drink plenty of fluids. Water is fine as well as fruit juices, sodas and electrolyte beverages. You may want to stay away from caffeine or alcohol. If you are nauseated, try taking small sips of liquids. How do you know if you are getting enough fluid? Your urine should be a pale yellow or almost colorless. . Get rest. . Taking a steamy shower or using a humidifier may help nasal congestion and ease sore throat pain. You can place a towel  over your head and breathe in the steam from hot water coming from a faucet. . Using a saline nasal spray works much the same way. . Cough drops, hard candies and sore throat lozenges may ease your cough. . Avoid close contacts especially the very young and the elderly . Cover your mouth if you cough or sneeze . Always remember to wash your hands.   GET HELP RIGHT AWAY IF: . You develop worsening fever. . If your symptoms do not improve within 10 days . You develop yellow or green discharge from your nose over 3 days. . You have coughing fits . You develop a severe head ache or visual changes. . You develop shortness of breath, difficulty breathing or start having chest pain . Your symptoms persist after you have completed your treatment plan  MAKE SURE YOU   Understand these instructions.  Will watch your condition.  Will get help right away if you are not doing well or get worse.  Your e-visit answers were reviewed by a board certified advanced clinical practitioner to complete your personal care plan. Depending upon the condition, your plan could have included both over the counter or prescription medications. Please review your pharmacy choice. If there is a problem, you may call our nursing hot line at and have the prescription routed to another pharmacy. Your safety is important to [place]. If you have drug allergies check your prescription carefully.   You can use MyChart to ask questions about today's visit, request a non-urgent call back, or ask for a work or school excuse for 24 hours related to this e-Visit. If it has been greater than 24 hours you will need to follow up with your provider, or enter a new e-Visit to address those concerns. You will get an e-mail in the next two days asking about your experience.  I hope that your e-visit has been valuable and will speed your recovery. Thank you for using e-visits.  5-10 minutes spent reviewing and documenting in chart.    '

## 2019-12-10 ENCOUNTER — Telehealth: Payer: BC Managed Care – PPO | Admitting: Nurse Practitioner

## 2019-12-10 DIAGNOSIS — Z20822 Contact with and (suspected) exposure to covid-19: Secondary | ICD-10-CM

## 2019-12-10 NOTE — Progress Notes (Signed)
E-Visit for Corona Virus Screening  Your current symptoms could be consistent with the coronavirus.  Many health care providers can now test patients at their office but not all are.  [facility] has multiple testing sites. For information on our COVID testing locations and hours go to [email or web address]  I have read through your chart and reviewed you last e visit. I am sorry that his has happened to you. All I can suggest you do is make an appointmentt to be tested for covid. Listed below is what you need to do in order to be tested. Good luck!  We are enrolling you in our [place] for [place] . Daily you will receive a questionnaire within the [place] website. Our COVID 19 response team will be monitoring your responses daily.  Testing Information: The COVID-19 Community Testing sites will begin testing BY APPOINTMENT ONLY.  You can schedule online at [email or web address]  If you do not have access to a smart phone or computer you may call [phone] for an appointment.   Additional testing sites in the Community:  . For [facility] Testing sites in [facility]  [email or web address]  . For Pop-up testing sites in [place]  [email or web address]  . For Testing sites with regular hours [email or web address]  . For [place] [email or web address]  . For [facility] [email or web address]  . For [facility] testing in [place] and [employer] [email or web address]  . For [facility] testing in [facility]   [email or web address]  For  more information about community testing call  [phone]   Please quarantine yourself while awaiting your test results. Please stay home for a minimum of 10 days from the first day of illness with improving symptoms and you have had 24 hours of no fever (without the use of Tylenol (Acetaminophen) Motrin (Ibuprofen) or any fever reducing medication).  Also - Do not get tested prior to returning to work because once you have had a positive test the test can stay positive for more then a month in some cases.   You should wear a mask or cloth face covering over your nose and mouth if you must be around other people or animals, including pets (even at home). Try to stay at least 6 feet away from other people. This will protect the people around you.  Please continue good preventive care measures, including:  frequent hand-washing, avoid touching your face, cover coughs/sneezes, stay out of crowds and keep a 6 foot distance from others.  COVID-19 is a respiratory illness with symptoms that are similar to the flu. Symptoms are typically mild to moderate, but there have been cases of severe illness and death due to the virus.   The following symptoms may appear 2-14 days after exposure: . Fever . Cough . Shortness of breath or difficulty breathing . Chills . Repeated shaking with chills . Muscle pain . Headache . Sore throat . New loss of taste or smell . Fatigue . Congestion or runny nose . Nausea or vomiting . Diarrhea  Go to the nearest hospital ED for assessment if fever/cough/breathlessness are severe or illness seems like a threat to life.  It is vitally important that if you feel that you have an infection such as this virus or any other virus that you stay home and away from places where you may spread it to others.  You should avoid contact with people age 63 and older.   You can use medication such as delsym od mucinx OTC for  cough.  You may also take acetaminophen (Tylenol) as needed for fever.  Reduce your risk of any infection  by using the same precautions used for avoiding the common cold or flu:  Marland Kitchen Wash your hands often with soap and warm water for at least 20 seconds.  If soap and water are not readily available, use an alcohol-based hand sanitizer with at least 60% alcohol.  . If coughing or sneezing, cover your mouth and nose by coughing or sneezing into the elbow areas of your shirt or coat, into a tissue or into your sleeve (not your hands). . Avoid shaking hands with others and consider head nods or verbal greetings only. . Avoid touching your eyes, nose, or mouth with unwashed hands.  . Avoid close contact with people who are sick. . Avoid places or events with large numbers of people in one location, like concerts or sporting events. . Carefully consider travel plans you have or are making. . If you are planning any travel outside or inside the [place], visit the CDC's Travelers' Health webpage for the latest health notices. . If you have some symptoms but not all symptoms, continue to monitor at home and seek medical attention if your symptoms worsen. . If you are having a medical emergency, call 911.  HOME CARE . Only take medications as instructed by your medical team. . Drink plenty of fluids and get plenty of rest. . A steam or ultrasonic humidifier can help if you have congestion.   GET HELP RIGHT AWAY IF YOU HAVE EMERGENCY WARNING SIGNS** FOR COVID-19. If you or someone is showing any of these signs seek emergency medical care immediately. Call 911 or proceed to your closest emergency facility if: . You develop worsening high fever. . Trouble breathing . Bluish lips or face . Persistent pain or pressure in the chest . New confusion . Inability to wake or stay awake . You cough up blood. . Your symptoms become more severe  **This list is not all possible symptoms. Contact your medical provider for any symptoms that are sever or concerning to you.  MAKE SURE YOU   Understand these  instructions.  Will watch your condition.  Will get help right away if you are not doing well or get worse.  Your e-visit answers were reviewed by a board certified advanced clinical practitioner to complete your personal care plan.  Depending on the condition, your plan could have included both over the counter or prescription medications.  If there is a problem please reply once you have received a response from your provider.  Your safety is important to [place].  If you have drug allergies check your prescription carefully.    You can use MyChart to ask questions about today's visit, request a non-urgent call back, or ask for a work or school excuse for 24 hours related to this e-Visit. If it has been greater than 24 hours you will need to follow up with your provider, or enter a new e-Visit to address those concerns. You will get an e-mail in the next two days asking about your experience.  I hope that your e-visit has been valuable and will speed your recovery. Thank you for using e-visits.   5-10 minutes spent reviewing and documenting in chart.

## 2019-12-12 ENCOUNTER — Encounter: Payer: Self-pay | Admitting: Family

## 2020-01-24 ENCOUNTER — Other Ambulatory Visit: Payer: Self-pay | Admitting: Family

## 2020-04-23 ENCOUNTER — Other Ambulatory Visit: Payer: Self-pay | Admitting: Family

## 2020-04-30 ENCOUNTER — Encounter: Payer: Self-pay | Admitting: Family

## 2020-04-30 ENCOUNTER — Other Ambulatory Visit: Payer: Self-pay | Admitting: Family

## 2020-04-30 MED ORDER — CITALOPRAM HYDROBROMIDE 40 MG PO TABS: 40.0000 mg | ORAL_TABLET | Freq: Every day | ORAL | 1 refills | Status: DC

## 2020-05-02 ENCOUNTER — Encounter: Payer: 59 | Admitting: Family

## 2020-05-17 ENCOUNTER — Other Ambulatory Visit: Payer: Self-pay | Admitting: Family

## 2020-05-30 ENCOUNTER — Ambulatory Visit (INDEPENDENT_AMBULATORY_CARE_PROVIDER_SITE_OTHER): Payer: Medicaid Other | Admitting: Family

## 2020-05-30 ENCOUNTER — Other Ambulatory Visit: Payer: Self-pay

## 2020-05-30 ENCOUNTER — Encounter: Payer: Self-pay | Admitting: Family

## 2020-05-30 VITALS — BP 113/65 | HR 62 | Temp 98.4°F | Resp 16 | Ht 66.0 in | Wt 214.0 lb

## 2020-05-30 DIAGNOSIS — E039 Hypothyroidism, unspecified: Secondary | ICD-10-CM | POA: Diagnosis not present

## 2020-05-30 DIAGNOSIS — Z0000 Encounter for general adult medical examination without abnormal findings: Secondary | ICD-10-CM

## 2020-05-30 DIAGNOSIS — Z23 Encounter for immunization: Secondary | ICD-10-CM

## 2020-05-30 NOTE — Progress Notes (Signed)
Subjective:    Patient ID: Jessica Villa, female    DOB: 04-25-1981, 39 y.o.   MRN: [id number]  HPI  Patient presents today for complete physical.  Immunizations: tdap 2017 Diet:  healthy Exercise: intermittent- walking Pap Smear: 04/23/19 Mammogram: nursing Vision: due Dental: up to date  Hypothyroid- maintained on synthroid. Feels ok on current dose.  Lab Results  Component Value Date   TSH 0.18 (L) 05/01/2019   Anxiety/Depression- maintained on citalopram.  Reports that if she misses her dose   Review of Systems  Constitutional: Negative for unexpected weight change.  HENT: Negative for hearing loss and rhinorrhea.   Eyes: Negative for visual disturbance.  Respiratory: Negative for cough and shortness of breath.   Cardiovascular: Negative for chest pain.  Gastrointestinal: Negative for constipation and diarrhea.  Genitourinary: Negative for dysuria, frequency and menstrual problem.  Musculoskeletal: Negative for arthralgias and myalgias.  Skin: Positive for rash (eczema-hands, beneath breasts).  Neurological: Negative for headaches.  Hematological: Negative for adenopathy.  Psychiatric/Behavioral:       Mood is stable   Past Medical History:  Diagnosis Date  . Anemia   . Depression   . Eczema   . History of ovarian cyst   . Hx of varicella   . Hypothyroidism   . Thyroid disease    hypothyroid     Social History   Socioeconomic History  . Marital status: Married    Spouse name: Not on file  . Number of children: Not on file  . Years of education: Not on file  . Highest education level: Not on file  Occupational History  . Not on file  Tobacco Use  . Smoking status: Never Smoker  . Smokeless tobacco: Never Used  Vaping Use  . Vaping Use: Never used  Substance and Sexual Activity  . Alcohol use: Yes    Comment: 1 cocktail or wine per month none since pregnancy  . Drug use: No  . Sexual activity: Yes    Partners: Male  Other Topics Concern  .  Not on file  Social History Narrative   2 step children ( 6year and 41 year old) lives with pt and her husband.    1 son born 2017   Stay at home mom   Has masters degree in education leadership   Has worked as a Dance movement psychotherapist, crocheting, no pets      Social Determinants of Corporate investment banker Strain:   . Difficulty of Paying Living Expenses: Not on file  Food Insecurity:   . Worried About Programme researcher, broadcasting/film/video in the Last Year: Not on file  . Ran Out of Food in the Last Year: Not on file  Transportation Needs:   . Lack of Transportation (Medical): Not on file  . Lack of Transportation (Non-Medical): Not on file  Physical Activity:   . Days of Exercise per Week: Not on file  . Minutes of Exercise per Session: Not on file  Stress:   . Feeling of Stress : Not on file  Social Connections:   . Frequency of Communication with Friends and Family: Not on file  . Frequency of Social Gatherings with Friends and Family: Not on file  . Attends Religious Services: Not on file  . Active Member of Clubs or Organizations: Not on file  . Attends Banker Meetings: Not on file  . Marital Status: Not on file  Intimate Partner Violence:   .  Fear of Current or Ex-Partner: Not on file  . Emotionally Abused: Not on file  . Physically Abused: Not on file  . Sexually Abused: Not on file    Past Surgical History:  Procedure Laterality Date  . CESAREAN SECTION N/A 02/18/2016   Procedure: CESAREAN SECTION;  Surgeon: Harold HedgeJames Tomblin, MD;  Location: [facility];  Service: Obstetrics;  Laterality: N/A;  . CESAREAN SECTION WITH BILATERAL TUBAL LIGATION N/A 01/10/2019   Procedure: CESAREAN SECTION WITH BILATERAL TUBAL LIGATION;  Surgeon: Harold Hedgeomblin, James, MD;  Location: [facility] LD ORS;  Service: Obstetrics;  Laterality: N/A;  Repeat edc 4/29 NKDA Tracey RNFA  . NO PAST SURGERIES      Family History  Problem Relation Age of Onset  . Depression Mother   .  Migraines Mother   . Dementia Father   . Diabetes Father   . Depression Father   . Hypothyroidism Father   . Alcohol abuse Maternal Grandmother   . Alcohol abuse Maternal Grandfather   . Hypothyroidism Paternal Grandmother   . Heart disease Paternal Grandfather   . Diabetes Paternal Grandfather   . Skin cancer Paternal Grandfather   . Depression Sister   . ADD / ADHD Sister   . Irritable bowel syndrome Sister   . Panic disorder Brother   . Hypothyroidism Brother     No Known Allergies  Current Outpatient Medications on File Prior to Visit  Medication Sig Dispense Refill  . citalopram (CELEXA) 40 MG tablet Take 1 tablet (40 mg total) by mouth daily. 90 tablet 1  . levothyroxine (SYNTHROID) 150 MCG tablet TAKE 1 TABLET (150 MCG TOTAL) BY MOUTH DAILY BEFORE BREAKFAST. 30 tablet 1   No current facility-administered medications on file prior to visit.    BP 113/65 (BP Location: Right Arm, Patient Position: Sitting, Cuff Size: Small)   Pulse 62   Temp 98.4 F (36.9 C) (Oral)   Resp 16   Ht 5\' 6"  (1.676 m)   Wt 214 lb (97.1 kg)   SpO2 100%   BMI 34.54 kg/m         Past Medical History:  Diagnosis Date  . Anemia   . Depression   . Eczema   . History of ovarian cyst   . Hx of varicella   . Hypothyroidism   . Thyroid disease    hypothyroid     Social History   Socioeconomic History  . Marital status: Married    Spouse name: Not on file  . Number of children: Not on file  . Years of education: Not on file  . Highest education level: Not on file  Occupational History  . Not on file  Tobacco Use  . Smoking status: Never Smoker  . Smokeless tobacco: Never Used  Vaping Use  . Vaping Use: Never used  Substance and Sexual Activity  . Alcohol use: Yes    Comment: 1 cocktail or wine per month none since pregnancy  . Drug use: No  . Sexual activity: Yes    Partners: Male  Other Topics Concern  . Not on file  Social History Narrative   2 step children ( 6year  and [id number] year old) lives with pt and her husband.    1 son born 2017   Stay at home mom   Has masters degree in education leadership   Has worked as a Dance movement psychotherapistteacher   Enjoys hiking   Crafts, crocheting, no pets      Social Determinants of Health  Financial Resource Strain:   . Difficulty of Paying Living Expenses: Not on file  Food Insecurity:   . Worried About Programme researcher, broadcasting/film/video in the Last Year: Not on file  . Ran Out of Food in the Last Year: Not on file  Transportation Needs:   . Lack of Transportation (Medical): Not on file  . Lack of Transportation (Non-Medical): Not on file  Physical Activity:   . Days of Exercise per Week: Not on file  . Minutes of Exercise per Session: Not on file  Stress:   . Feeling of Stress : Not on file  Social Connections:   . Frequency of Communication with Friends and Family: Not on file  . Frequency of Social Gatherings with Friends and Family: Not on file  . Attends Religious Services: Not on file  . Active Member of Clubs or Organizations: Not on file  . Attends Banker Meetings: Not on file  . Marital Status: Not on file  Intimate Partner Violence:   . Fear of Current or Ex-Partner: Not on file  . Emotionally Abused: Not on file  . Physically Abused: Not on file  . Sexually Abused: Not on file    Past Surgical History:  Procedure Laterality Date  . CESAREAN SECTION N/A 02/18/2016   Procedure: CESAREAN SECTION;  Surgeon: Harold Hedge, MD;  Location: [facility] BIRTHING SUITES;  Service: Obstetrics;  Laterality: N/A;  . CESAREAN SECTION WITH BILATERAL TUBAL LIGATION N/A 01/10/2019   Procedure: CESAREAN SECTION WITH BILATERAL TUBAL LIGATION;  Surgeon: Harold Hedge, MD;  Location: [facility] LD ORS;  Service: Obstetrics;  Laterality: N/A;  Repeat edc 4/29 NKDA Tracey RNFA  . NO PAST SURGERIES      Family History  Problem Relation Age of Onset  . Depression Mother   . Migraines Mother   . Dementia Father   . Diabetes Father   .  Depression Father   . Hypothyroidism Father   . Alcohol abuse Maternal Grandmother   . Alcohol abuse Maternal Grandfather   . Hypothyroidism Paternal Grandmother   . Heart disease Paternal Grandfather   . Diabetes Paternal Grandfather   . Skin cancer Paternal Grandfather   . Depression Sister   . ADD / ADHD Sister   . Irritable bowel syndrome Sister   . Panic disorder Brother   . Hypothyroidism Brother     No Known Allergies  Current Outpatient Medications on File Prior to Visit  Medication Sig Dispense Refill  . citalopram (CELEXA) 40 MG tablet Take 1 tablet (40 mg total) by mouth daily. 90 tablet 1  . levothyroxine (SYNTHROID) 150 MCG tablet TAKE 1 TABLET (150 MCG TOTAL) BY MOUTH DAILY BEFORE BREAKFAST. 30 tablet 1   No current facility-administered medications on file prior to visit.    BP 113/65 (BP Location: Right Arm, Patient Position: Sitting, Cuff Size: Small)   Pulse 62   Temp 98.4 F (36.9 C) (Oral)   Resp 16   Ht 5\' 6"  (1.676 m)   Wt 214 lb (97.1 kg)   SpO2 100%   BMI 34.54 kg/m    Objective:   Physical Exam Constitutional:      Appearance: She is well-developed.  HENT:     Head: Normocephalic and atraumatic.     Right Ear: Tympanic membrane and ear canal normal.     Left Ear: Tympanic membrane and ear canal normal.  Neck:     Thyroid: No thyroid mass or thyromegaly.  Cardiovascular:     Rate and  Rhythm: Normal rate and regular rhythm.     Heart sounds: Normal heart sounds. No murmur heard.   Pulmonary:     Effort: Pulmonary effort is normal. No respiratory distress.     Breath sounds: Normal breath sounds. No wheezing.  Psychiatric:        Behavior: Behavior normal.        Thought Content: Thought content normal.        Judgment: Judgment normal.           Assessment & Plan:   Preventative care- discussed healthy diet, exercise and weight loss. Will obtain routine lab work including tsh.  She plans to get flu shot at the pharmacy as it  will be cheaper for her.   Wt Readings from Last 3 Encounters:  05/30/20 214 lb (97.1 kg)  06/05/19 204 lb (92.5 kg)  05/01/19 208 lb (94.3 kg)   This visit occurred during the SARS-CoV-2 public health emergency.  Safety protocols were in place, including screening questions prior to the visit, additional usage of staff PPE, and extensive cleaning of exam room while observing appropriate contact time as indicated for disinfecting solutions.

## 2020-05-30 NOTE — Patient Instructions (Signed)
Continue to work on [employer], exercise and weight loss.  Let me know when you are done breast feeding and we will obtain a mammogram.  Preventive Care 80-41 Years Old, Female Preventive care refers to visits with your health care provider and lifestyle choices that can promote health and wellness. This includes:  A yearly physical exam. This may also be called an annual well check.  Regular dental visits and eye exams.  Immunizations.  Screening for certain conditions.  Healthy lifestyle choices, such as eating a healthy diet, getting regular exercise, not using drugs or products that contain nicotine and tobacco, and limiting alcohol use. What can I expect for my preventive care visit? Physical exam Your health care provider will check your:  Height and weight. This may be used to calculate body mass index (BMI), which tells if you are at a healthy weight.  Heart rate and blood pressure.  Skin for abnormal spots. Counseling Your health care provider may ask you questions about your:  Alcohol, tobacco, and drug use.  Emotional well-being.  Home and relationship well-being.  Sexual activity.  Eating habits.  Work and work Statistician.  Method of birth control.  Menstrual cycle.  Pregnancy history. What immunizations do I need?  Influenza (flu) vaccine  This is recommended every year. Tetanus, diphtheria, and pertussis (Tdap) vaccine  You may need a Td booster every 10 years. Varicella (chickenpox) vaccine  You may need this if you have not been vaccinated. Zoster (shingles) vaccine  You may need this after age 64. Measles, mumps, and rubella (MMR) vaccine  You may need at least one dose of MMR if you were born in 1957 or later. You may also need a second dose. Pneumococcal conjugate (PCV13) vaccine  You may need this if you have certain conditions and were not previously vaccinated. Pneumococcal polysaccharide (PPSV23) vaccine  You may need one or  two doses if you smoke cigarettes or if you have certain conditions. Meningococcal conjugate (MenACWY) vaccine  You may need this if you have certain conditions. Hepatitis A vaccine  You may need this if you have certain conditions or if you travel or work in places where you may be exposed to hepatitis A. Hepatitis B vaccine  You may need this if you have certain conditions or if you travel or work in places where you may be exposed to hepatitis B. Haemophilus influenzae type b (Hib) vaccine  You may need this if you have certain conditions. Human papillomavirus (HPV) vaccine  If recommended by your health care provider, you may need three doses over 6 months. You may receive vaccines as individual doses or as more than one vaccine together in one shot (combination vaccines). Talk with your health care provider about the risks and benefits of combination vaccines. What tests do I need? Blood tests  Lipid and cholesterol levels. These may be checked every 5 years, or more frequently if you are over 64 years old.  Hepatitis C test.  Hepatitis B test. Screening  Lung cancer screening. You may have this screening every year starting at age 54 if you have a 30-pack-year history of smoking and currently smoke or have quit within the past 15 years.  Colorectal cancer screening. All adults should have this screening starting at age 51 and continuing until age 29. Your health care provider may recommend screening at age 13 if you are at increased risk. You will have tests every 1-10 years, depending on your results and the type of  screening test.  Diabetes screening. This is done by checking your blood sugar (glucose) after you have not eaten for a while (fasting). You may have this done every 1-3 years.  Mammogram. This may be done every 1-2 years. Talk with your health care provider about when you should start having regular mammograms. This may depend on whether you have a family history  of breast cancer.  BRCA-related cancer screening. This may be done if you have a family history of breast, ovarian, tubal, or peritoneal cancers.  Pelvic exam and Pap test. This may be done every 3 years starting at age 21. Starting at age 30, this may be done every 5 years if you have a Pap test in combination with an HPV test. Other tests  Sexually transmitted disease (STD) testing.  Bone density scan. This is done to screen for osteoporosis. You may have this scan if you are at high risk for osteoporosis. Follow these instructions at home: Eating and drinking  Eat a diet that includes fresh fruits and vegetables, whole grains, lean protein, and low-fat dairy.  Take vitamin and mineral supplements as recommended by your health care provider.  Do not drink alcohol if: ? Your health care provider tells you not to drink. ? You are pregnant, may be pregnant, or are planning to become pregnant.  If you drink alcohol: ? Limit how much you have to 0-1 drink a day. ? Be aware of how much alcohol is in your drink. In the [place] one drink equals one 12 oz bottle of beer (355 mL), one 5 oz glass of wine (148 mL), or one 1 oz glass of hard liquor (44 mL). Lifestyle  Take daily care of your teeth and gums.  Stay active. Exercise for at least 30 minutes on 5 or more days each week.  Do not use any products that contain nicotine or tobacco, such as cigarettes, e-cigarettes, and chewing tobacco. If you need help quitting, ask your health care provider.  If you are sexually active, practice safe sex. Use a condom or other form of birth control (contraception) in order to prevent pregnancy and STIs (sexually transmitted infections).  If told by your health care provider, take low-dose aspirin daily starting at age 50. What's next?  Visit your health care provider once a year for a well check visit.  Ask your health care provider how often you should have your eyes and teeth checked.  Stay up  to date on all vaccines. This information is not intended to replace advice given to you by your health care provider. Make sure you discuss any questions you have with your health care provider. Document Revised: 05/18/2018 Document Reviewed: 05/18/2018 Elsevier Patient Education  2020 Elsevier Inc.  

## 2020-05-30 NOTE — Addendum Note (Signed)
Addended by: Mervin Kung A on: 05/30/2020 09:52 AM   Modules accepted: Orders

## 2020-05-31 LAB — BASIC METABOLIC PANEL
BUN/Creatinine Ratio: 16 (ref 9–23)
BUN: 12 mg/dL (ref 6–24)
CO2: 22 mmol/L (ref 20–29)
Calcium: 9.1 mg/dL (ref 8.7–10.2)
Chloride: 103 mmol/L (ref 96–106)
Creatinine, Ser: 0.75 mg/dL (ref 0.57–1.00)
GFR calc Af Amer: 114 mL/min/{1.73_m2} (ref >59)
GFR calc non Af Amer: 99 mL/min/{1.73_m2} (ref >59)
Glucose: 94 mg/dL (ref 65–99)
Potassium: 4.6 mmol/L (ref 3.5–5.2)
Sodium: 138 mmol/L (ref 134–144)

## 2020-05-31 LAB — CBC WITH DIFFERENTIAL/PLATELET
Basophils Absolute: 0.1 10*3/uL (ref 0.0–0.2)
Basos: 1 %
EOS (ABSOLUTE): 0.2 10*3/uL (ref 0.0–0.4)
Eos: 3 %
Hematocrit: 39.1 % (ref 34.0–46.6)
Hemoglobin: 12.7 g/dL (ref 11.1–15.9)
Immature Grans (Abs): 0 10*3/uL (ref 0.0–0.1)
Immature Granulocytes: 0 %
Lymphocytes Absolute: 2.4 10*3/uL (ref 0.7–3.1)
Lymphs: 30 %
MCH: 29 pg (ref 26.6–33.0)
MCHC: 32.5 g/dL (ref 31.5–35.7)
MCV: 89 fL (ref 79–97)
Monocytes Absolute: 0.9 10*3/uL (ref 0.1–0.9)
Monocytes: 12 %
Neutrophils Absolute: 4.5 10*3/uL (ref 1.4–7.0)
Neutrophils: 54 %
Platelets: 244 10*3/uL (ref 150–450)
RBC: 4.38 x10E6/uL (ref 3.77–5.28)
RDW: 11.7 % (ref 11.7–15.4)
WBC: 8.1 10*3/uL (ref 3.4–10.8)

## 2020-05-31 LAB — HEPATIC FUNCTION PANEL
ALT: 18 IU/L (ref 0–32)
AST: 24 IU/L (ref 0–40)
Albumin: 4.2 g/dL (ref 3.8–4.8)
Alkaline Phosphatase: 72 IU/L (ref 48–121)
Bilirubin Total: 0.2 mg/dL (ref 0.0–1.2)
Bilirubin, Direct: 0.1 mg/dL (ref 0.00–0.40)
Total Protein: 6.7 g/dL (ref 6.0–8.5)

## 2020-05-31 LAB — LIPID PANEL
Chol/HDL Ratio: 2.9 ratio (ref 0.0–4.4)
Cholesterol, Total: 153 mg/dL (ref 100–199)
HDL: 53 mg/dL (ref >39)
LDL Chol Calc (NIH): 91 mg/dL (ref 0–99)
Triglycerides: 41 mg/dL (ref 0–149)
VLDL Cholesterol Cal: 9 mg/dL (ref 5–40)

## 2020-05-31 LAB — TSH: TSH: 3.74 u[IU]/mL (ref 0.450–4.500)

## 2020-06-02 ENCOUNTER — Other Ambulatory Visit: Payer: Self-pay | Admitting: Family

## 2020-07-08 ENCOUNTER — Encounter: Payer: Self-pay | Admitting: Family

## 2020-07-10 ENCOUNTER — Ambulatory Visit: Payer: Medicaid Other | Admitting: Medical

## 2020-07-10 ENCOUNTER — Encounter: Payer: Self-pay | Admitting: Medical

## 2020-07-10 ENCOUNTER — Telehealth: Payer: Self-pay | Admitting: Medical

## 2020-07-10 ENCOUNTER — Other Ambulatory Visit: Payer: Self-pay

## 2020-07-10 ENCOUNTER — Telehealth: Payer: Self-pay | Admitting: Family

## 2020-07-10 ENCOUNTER — Ambulatory Visit (HOSPITAL_BASED_OUTPATIENT_CLINIC_OR_DEPARTMENT_OTHER)
Admission: RE | Admit: 2020-07-10 | Discharge: 2020-07-10 | Disposition: A | Discharge status: Home / Self Care | Payer: Medicaid Other | Source: Ambulatory Visit | Attending: Medical | Admitting: Medical

## 2020-07-10 VITALS — BP 112/62 | HR 60 | Temp 98.5°F | Resp 20 | Ht 66.0 in | Wt 211.0 lb

## 2020-07-10 DIAGNOSIS — F419 Anxiety disorder, unspecified: Secondary | ICD-10-CM | POA: Diagnosis not present

## 2020-07-10 DIAGNOSIS — R110 Nausea: Secondary | ICD-10-CM

## 2020-07-10 DIAGNOSIS — K219 Gastro-esophageal reflux disease without esophagitis: Secondary | ICD-10-CM

## 2020-07-10 DIAGNOSIS — F32A Depression, unspecified: Secondary | ICD-10-CM

## 2020-07-10 DIAGNOSIS — R109 Unspecified abdominal pain: Secondary | ICD-10-CM | POA: Insufficient documentation

## 2020-07-10 DIAGNOSIS — M546 Pain in thoracic spine: Secondary | ICD-10-CM | POA: Insufficient documentation

## 2020-07-10 DIAGNOSIS — Z23 Encounter for immunization: Secondary | ICD-10-CM

## 2020-07-10 DIAGNOSIS — K8020 Calculus of gallbladder without cholecystitis without obstruction: Secondary | ICD-10-CM

## 2020-07-10 MED ORDER — ONDANSETRON 4 MG PO TBDP: 4.0000 mg | ORAL_TABLET | Freq: Three times a day (TID) | ORAL | 0 refills | Status: DC | PRN

## 2020-07-10 NOTE — Telephone Encounter (Signed)
Patient states Buspar is a class B medication. She also want to know if the follow up appointment should be with Ramon Dredge or Efraim Kaufmann

## 2020-07-10 NOTE — Patient Instructions (Signed)
You do have recent description of some abdomen discomfort, back pain right of thoracic region along with nausea.  This could represent gallbladder cause for pain.  Placed order for CBC, CMP and lipase.  Also order for abdomen ultrasound.  Continue Pepcid for possible GERD.  Considered prescribing Zofran but you have breast-feeding so decided against that/do not will prescription it was sent to pharmacy.  For recent increase of anxiety related to family issues, continue Celexa and you could discuss with your pharmacist whether or not BuSpar is category B.  I think this is the case but if they confirm this and you are comfortable with taking will prescribe for increased anxiety.  Follow-up in 7 to 10 days or as needed.

## 2020-07-10 NOTE — Telephone Encounter (Signed)
Pt has FU with Ramon Dredge 11/1 .

## 2020-07-10 NOTE — Progress Notes (Signed)
Subjective:    Patient ID: Jessica Villa, female    DOB: [birth year], 39 y.o.   MRN: [id number]  HPI  Pt in for recent abdomen pain"heart burn" per pt on her my chart message., back pain and with some nausea.   Pt states onset about 4 days ago.  Pt does no direct correlation with eating. But thinks maybe makes worse.   She has had some nausea. She states having some constant pain. Not having any obvious reflux.  Pt states she is having some severe reflux since husband ex taking her and husband to court.    Pt lmp 3 weeks ago and had tubal ligation.  Pt states last night around mid night her symptoms did subside.  Pt took pepcid once yesterday morning and twice day before.  Pt has been my charting her pcp and she states concern expressed for gallbladder etiology.   Review of Systems  Constitutional: Negative for chills, fatigue and fever.  Respiratory: Negative for cough, choking, shortness of breath and wheezing.   Cardiovascular: Negative for chest pain and palpitations.  Gastrointestinal: Positive for abdominal pain and nausea. Negative for blood in stool and constipation.       See hpi.  Genitourinary: Negative for dysuria.  Musculoskeletal: Negative for back pain.  Skin: Negative for rash.  Neurological: Negative for dizziness, speech difficulty, weakness, numbness and headaches.  Hematological: Negative for adenopathy. Does not bruise/bleed easily.  Psychiatric/Behavioral: Negative for behavioral problems and decreased concentration.    Past Medical History:  Diagnosis Date   Anemia    Depression    Eczema    History of ovarian cyst    Hx of varicella    Hypothyroidism    Thyroid disease    hypothyroid     Social History   Socioeconomic History   Marital status: Married    Spouse name: Not on file   Number of children: Not on file   Years of education: Not on file   Highest education level: Not on file  Occupational History   Not on file    Tobacco Use   Smoking status: Never Smoker   Smokeless tobacco: Never Used  Vaping Use   Vaping Use: Never used  Substance and Sexual Activity   Alcohol use: Yes    Comment: 1 cocktail or wine per month none since pregnancy   Drug use: No   Sexual activity: Yes    Partners: Male  Other Topics Concern   Not on file  Social History Narrative   2 step children ( 6year and 41 year old) lives with pt and her husband.    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   Has masters degree in education leadership   Has worked as a Dance movement psychotherapist, crocheting, no pets      Social Determinants of Corporate investment banker Strain:    Difficulty of Paying Living Expenses: Not on file  Food Insecurity:    Worried About Programme researcher, broadcasting/film/video in the Last Year: Not on file   [employer] of Food in the Last Year: Not on file  Transportation Needs:    Freight forwarder (Medical): Not on file   Lack of Transportation (Non-Medical): Not on file  Physical Activity:    Days of Exercise per Week: Not on file   Minutes of Exercise per Session: Not on file  Stress:    Feeling  of Stress : Not on file  Social Connections:    Frequency of Communication with Friends and Family: Not on file   Frequency of Social Gatherings with Friends and Family: Not on file   Attends Religious Services: Not on file   Active Member of Clubs or Organizations: Not on file   Attends Banker Meetings: Not on file   Marital Status: Not on file  Intimate Partner Violence:    Fear of Current or Ex-Partner: Not on file   Emotionally Abused: Not on file   Physically Abused: Not on file   Sexually Abused: Not on file    Past Surgical History:  Procedure Laterality Date   CESAREAN SECTION N/A 02/18/2016   Procedure: CESAREAN SECTION;  Surgeon: Harold Hedge, MD;  Location: [facility]THING SU[facility];  Service: Obstetrics;  Laterality: N/A;   CESAREAN  SECTION WITH BILATERAL TUBAL LIGATION N/A 01/10/2019   Procedure: CESAREAN SECTION WITH BILATERAL TUBAL LIGATION;  Surgeon: Harold Hedge, MD;  Location: [facility] LD ORS;  Service: Obstetrics;  Laterality: N/A;  Repeat edc 4/29 NKDA Tracey RNFA   NO PAST SURGERIES      Family History  Problem Relation Age of Onset   Depression Mother    Migraines Mother    Dementia Father    Diabetes Father    Depression Father    Hypothyroidism Father    Alcohol abuse Maternal Grandmother    Alcohol abuse Maternal Grandfather    Hypothyroidism Paternal Grandmother    Heart disease Paternal Grandfather    Diabetes Paternal Grandfather    Skin cancer Paternal Grandfather    Depression Sister    ADD / ADHD Sister    Irritable bowel syndrome Sister    Panic disorder Brother    Hypothyroidism Brother     No Known Allergies  Current Outpatient Medications on File Prior to Visit  Medication Sig Dispense Refill   citalopram (CELEXA) 40 MG tablet Take 1 tablet (40 mg total) by mouth daily. 90 tablet 1   levothyroxine (SYNTHROID) 150 MCG tablet TAKE 1 TABLET (150 MCG TOTAL) BY MOUTH DAILY BEFORE BREAKFAST. 90 tablet 1   No current facility-administered medications on file prior to visit.    BP 112/62    Pulse 60    Temp 98.5 F (36.9 C) (Oral)    Resp 20    Ht 5\' 6"  (1.676 m)    Wt 211 lb (95.7 kg)    SpO2 100%    BMI 34.06 kg/m      Objective:   Physical Exam  General- No acute distress. Pleasant patient. Neck- Full range of motion, no jvd Lungs- Clear, even and unlabored. Heart- regular rate and rhythm. Neurologic- CNII- XII grossly intact. Abdomen- soft, nt, nd, +bs, no rebound or guarding. No organomealy.  Back- no cva tenderness. On inspection of the right upper back and no abnormality seen.  Skin normal.  No tenderness to palpation.      Assessment & Plan:  You do have recent description of some abdomen discomfort, back pain right of thoracic region along with  nausea.  This could represent gallbladder cause for pain.  Placed order for CBC, CMP and lipase.  Also order for abdomen ultrasound.  Continue Pepcid for possible GERD.  Considered prescribing Zofran but you have breast-feeding so decided against that/do not will prescription it was sent to pharmacy.  For recent increase of anxiety related to family issues, continue Celexa and you could discuss with your pharmacist whether or not BuSpar  is category B.  I think this is the case but if they confirm this and you are comfortable with taking will prescribe for increased anxiety.  Follow-up in 7 to 10 days or as needed.  Esperanza Richters, PA-C

## 2020-07-10 NOTE — Telephone Encounter (Signed)
Referral to general surgeon placed. 

## 2020-07-11 ENCOUNTER — Telehealth: Payer: Self-pay | Admitting: Medical

## 2020-07-11 ENCOUNTER — Telehealth: Payer: Self-pay | Admitting: Family

## 2020-07-11 LAB — COMPREHENSIVE METABOLIC PANEL
ALT: 15 IU/L (ref 0–32)
AST: 20 IU/L (ref 0–40)
Albumin/Globulin Ratio: 1.6 (ref 1.2–2.2)
Albumin: 4.4 g/dL (ref 3.8–4.8)
Alkaline Phosphatase: 64 IU/L (ref 44–121)
BUN/Creatinine Ratio: 18 (ref 9–23)
BUN: 15 mg/dL (ref 6–24)
Bilirubin Total: 0.3 mg/dL (ref 0.0–1.2)
CO2: 23 mmol/L (ref 20–29)
Calcium: 9.6 mg/dL (ref 8.7–10.2)
Chloride: 102 mmol/L (ref 96–106)
Creatinine, Ser: 0.84 mg/dL (ref 0.57–1.00)
GFR calc Af Amer: 100 mL/min/{1.73_m2} (ref >59)
GFR calc non Af Amer: 87 mL/min/{1.73_m2} (ref >59)
Globulin, Total: 2.8 g/dL (ref 1.5–4.5)
Glucose: 87 mg/dL (ref 65–99)
Potassium: 4.3 mmol/L (ref 3.5–5.2)
Sodium: 138 mmol/L (ref 134–144)
Total Protein: 7.2 g/dL (ref 6.0–8.5)

## 2020-07-11 LAB — CBC WITH DIFFERENTIAL/PLATELET
Basophils Absolute: 0.1 10*3/uL (ref 0.0–0.2)
Basos: 1 %
EOS (ABSOLUTE): 0.2 10*3/uL (ref 0.0–0.4)
Eos: 2 %
Hematocrit: 39.1 % (ref 34.0–46.6)
Hemoglobin: 13.1 g/dL (ref 11.1–15.9)
Immature Grans (Abs): 0 10*3/uL (ref 0.0–0.1)
Immature Granulocytes: 0 %
Lymphocytes Absolute: 2.3 10*3/uL (ref 0.7–3.1)
Lymphs: 27 %
MCH: 30 pg (ref 26.6–33.0)
MCHC: 33.5 g/dL (ref 31.5–35.7)
MCV: 90 fL (ref 79–97)
Monocytes Absolute: 1 10*3/uL — ABNORMAL HIGH (ref 0.1–0.9)
Monocytes: 11 %
Neutrophils Absolute: 5.2 10*3/uL (ref 1.4–7.0)
Neutrophils: 59 %
Platelets: 283 10*3/uL (ref 150–450)
RBC: 4.36 x10E6/uL (ref 3.77–5.28)
RDW: 11.9 % (ref 11.7–15.4)
WBC: 8.7 10*3/uL (ref 3.4–10.8)

## 2020-07-11 LAB — LIPASE: Lipase: 30 U/L (ref 14–72)

## 2020-07-11 MED ORDER — BUSPIRONE HCL 7.5 MG PO TABS: 7.5000 mg | ORAL_TABLET | Freq: Two times a day (BID) | ORAL | 0 refills | Status: DC

## 2020-07-11 NOTE — Telephone Encounter (Signed)
Patient called worried about her lab result, patient states they are abnormal    Please advise

## 2020-07-11 NOTE — Telephone Encounter (Signed)
Rx buspar sent to pt pharmacy. 

## 2020-07-11 NOTE — Telephone Encounter (Signed)
Please review labs so I can call patient

## 2020-07-11 NOTE — Telephone Encounter (Signed)
She wants to speak to someone in regards to her lab results

## 2020-07-12 NOTE — Telephone Encounter (Signed)
No clinically significant lab abnormality. I resulted labs.

## 2020-07-13 ENCOUNTER — Encounter: Payer: Self-pay | Admitting: Medical

## 2020-07-14 NOTE — Telephone Encounter (Signed)
Patient viewed labs via [employer]

## 2020-07-18 ENCOUNTER — Other Ambulatory Visit: Payer: Self-pay

## 2020-07-18 ENCOUNTER — Ambulatory Visit (INDEPENDENT_AMBULATORY_CARE_PROVIDER_SITE_OTHER): Payer: Medicaid Other | Admitting: Family Medicine

## 2020-07-18 ENCOUNTER — Encounter: Payer: Self-pay | Admitting: Family Medicine

## 2020-07-18 VITALS — BP 116/80 | HR 75 | Temp 98.3°F | Resp 18 | Ht 66.0 in | Wt 210.8 lb

## 2020-07-18 DIAGNOSIS — R110 Nausea: Secondary | ICD-10-CM | POA: Diagnosis not present

## 2020-07-18 DIAGNOSIS — R1013 Epigastric pain: Secondary | ICD-10-CM | POA: Diagnosis not present

## 2020-07-18 DIAGNOSIS — R42 Dizziness and giddiness: Secondary | ICD-10-CM

## 2020-07-18 MED ORDER — PANTOPRAZOLE SODIUM 40 MG PO TBEC: 40.0000 mg | DELAYED_RELEASE_TABLET | Freq: Every day | ORAL | 3 refills | Status: DC

## 2020-07-18 NOTE — Progress Notes (Signed)
Patient ID: Jessica Villa, female    DOB: 11-02-1980  Age: 39 y.o. MRN: [id number]    Subjective:  Subjective  HPI Jessica Villa presents for dizziness since starting buspar-- she has decreased to one buspar instead of bid --- she is also concerned because she was referred to surgeon for GB and they can not see her until the end of next month and she has been nauseous and c/o burping more  No other complaints   Review of Systems  Constitutional: Negative for appetite change, diaphoresis, fatigue and unexpected weight change.  Eyes: Negative for pain, redness and visual disturbance.  Respiratory: Negative for cough, chest tightness, shortness of breath and wheezing.   Cardiovascular: Negative for chest pain, palpitations and leg swelling.  Gastrointestinal: Positive for nausea.  Endocrine: Negative for cold intolerance, heat intolerance, polydipsia, polyphagia and polyuria.  Genitourinary: Negative for difficulty urinating, dysuria and frequency.  Neurological: Positive for dizziness. Negative for light-headedness, numbness and headaches.    History Past Medical History:  Diagnosis Date  . Anemia   . Depression   . Eczema   . History of ovarian cyst   . Hx of varicella   . Hypothyroidism   . Thyroid disease    hypothyroid    She has a past surgical history that includes No past surgeries; Cesarean section (N/A, 02/18/2016); and Cesarean section with bilateral tubal ligation (N/A, 01/10/2019).   Her family history includes ADD / ADHD in her sister; Alcohol abuse in her maternal grandfather and maternal grandmother; Dementia in her father; Depression in her father, mother, and sister; Diabetes in her father and paternal grandfather; Heart disease in her paternal grandfather; Hypothyroidism in her brother, father, and paternal grandmother; Irritable bowel syndrome in her sister; Migraines in her mother; Panic disorder in her brother; Skin cancer in her paternal grandfather.She reports  that she has never smoked. She has never used smokeless tobacco. She reports current alcohol use. She reports that she does not use drugs.  Current Outpatient Medications on File Prior to Visit  Medication Sig Dispense Refill  . busPIRone (BUSPAR) 7.5 MG tablet Take 1 tablet (7.5 mg total) by mouth 2 (two) times daily. 60 tablet 0  . citalopram (CELEXA) 40 MG tablet Take 1 tablet (40 mg total) by mouth daily. 90 tablet 1  . levothyroxine (SYNTHROID) 150 MCG tablet TAKE 1 TABLET (150 MCG TOTAL) BY MOUTH DAILY BEFORE BREAKFAST. 90 tablet 1   No current facility-administered medications on file prior to visit.     Objective:  Objective  Physical Exam Vitals and nursing note reviewed.  Constitutional:      Appearance: She is well-developed.  HENT:     Head: Normocephalic and atraumatic.  Eyes:     Conjunctiva/sclera: Conjunctivae normal.  Neck:     Thyroid: No thyromegaly.     Vascular: No carotid bruit or JVD.  Cardiovascular:     Rate and Rhythm: Normal rate and regular rhythm.     Heart sounds: Normal heart sounds. No murmur heard.   Pulmonary:     Effort: Pulmonary effort is normal. No respiratory distress.     Breath sounds: Normal breath sounds. No wheezing or rales.  Chest:     Chest wall: No tenderness.  Abdominal:     Tenderness: There is no abdominal tenderness. There is no guarding or rebound.  Musculoskeletal:     Cervical back: Normal range of motion and neck supple.  Neurological:     Mental Status: She is alert and oriented  to person, place, and time.    BP 116/80 (BP Location: Left Arm, Patient Position: Sitting, Cuff Size: Large)   Pulse 75   Temp 98.3 F (36.8 C) (Oral)   Resp 18   Ht 5\' 6"  (1.676 m)   Wt 210 lb 12.8 oz (95.6 kg)   SpO2 99%   BMI 34.02 kg/m  Wt Readings from Last 3 Encounters:  07/18/20 210 lb 12.8 oz (95.6 kg)  07/10/20 211 lb (95.7 kg)  05/30/20 214 lb (97.1 kg)     Lab Results  Component Value Date   WBC 8.7 07/10/2020    HGB 13.1 07/10/2020   HCT 39.1 07/10/2020   PLT 283 07/10/2020   GLUCOSE 87 07/10/2020   CHOL 153 05/30/2020   TRIG 41 05/30/2020   HDL 53 05/30/2020   LDLCALC 91 05/30/2020   ALT 15 07/10/2020   AST 20 07/10/2020   NA 138 07/10/2020   K 4.3 07/10/2020   CL 102 07/10/2020   CREATININE 0.84 07/10/2020   BUN 15 07/10/2020   CO2 23 07/10/2020   TSH 3.740 05/30/2020    07/30/2020 Abdomen Limited RUQ (LIVER/GB)  Result Date: 07/10/2020 CLINICAL DATA:  Right-sided abdominal pain for several months EXAM: ULTRASOUND ABDOMEN LIMITED RIGHT UPPER QUADRANT COMPARISON:  None. FINDINGS: Gallbladder: Gallbladder is contracted. Small Dutko with surrounding gallbladder sludge is noted within the fundus. Common bile duct: Diameter: 1.3 mm. Liver: Echogenicity is within normal limits. 1.8 cm hyperechoic focus is noted within the right lobe posteriorly consistent with hemangioma. Portal vein is patent on color Doppler imaging with normal direction of blood flow towards the liver. Other: None. IMPRESSION: Contracted gallbladder with single Gato in gallbladder sludge. Small hemangioma within the liver. Electronically Signed   By: 07/12/2020 M.D.   On: 07/10/2020 19:42     Assessment & Plan:  Plan  I am having Jessica Villa start on pantoprazole. I am also having her maintain her citalopram, levothyroxine, and busPIRone.  Meds ordered this encounter  Medications  . pantoprazole (PROTONIX) 40 MG tablet    Sig: Take 1 tablet (40 mg total) by mouth daily.    Dispense:  30 tablet    Refill:  3    Problem List Items Addressed This Visit      Unprioritized   Dizziness    Stop buspar and f/u with pcp      Dyspepsia - Primary    ppi per orders F/u pcp if no relief       Relevant Medications   pantoprazole (PROTONIX) 40 MG tablet   Nausea    ? gerd or GB We were able to get the pt in sooner with surgery and she was placed on wait list  Go to ER if symptoms worsen         Follow-up: Return if  symptoms worsen or fail to improve.  07/12/2020, DO

## 2020-07-18 NOTE — Patient Instructions (Addendum)
Surgery app has been moved to Nov 11 at 930 am and you have been put on the wait list       Parke Simmers Diet A bland diet consists of foods that are often soft and do not have a lot of fat, fiber, or extra seasonings. Foods without fat, fiber, or seasoning are easier for the body to digest. They are also less likely to irritate your mouth, throat, stomach, and other parts of your digestive system. A bland diet is sometimes called a BRAT diet. What is my plan? Your health care provider or food and nutrition specialist (dietitian) may recommend specific changes to your diet to prevent symptoms or to treat your symptoms. These changes may include:  Eating small meals often.  Cooking food until it is soft enough to chew easily.  Chewing your food well.  Drinking fluids slowly.  Not eating foods that are very spicy, sour, or fatty.  Not eating citrus fruits, such as oranges and grapefruit. What do I need to know about this diet?  Eat a variety of foods from the bland diet food list.  Do not follow a bland diet longer than needed.  Ask your health care provider whether you should take vitamins or supplements. What foods can I eat? Grains  Hot cereals, such as cream of wheat. Rice. Bread, crackers, or tortillas made from refined white flour. Vegetables Canned or cooked vegetables. Mashed or boiled potatoes. Fruits  Bananas. Applesauce. Other types of cooked or canned fruit with the skin and seeds removed, such as canned peaches or pears. Meats and other proteins  Scrambled eggs. Creamy peanut butter or other nut butters. Lean, well-cooked meats, such as chicken or fish. Tofu. Soups or broths. Dairy Low-fat dairy products, such as milk, cottage cheese, or yogurt. Beverages  Water. Herbal tea. Apple juice. Fats and oils Mild salad dressings. Canola or olive oil. Sweets and desserts Pudding. Custard. Fruit gelatin. Ice cream. The items listed above may not be a complete list of  recommended foods and beverages. Contact a dietitian for more options. What foods are not recommended? Grains Whole grain breads and cereals. Vegetables Raw vegetables. Fruits Raw fruits, especially citrus, berries, or dried fruits. Dairy Whole fat dairy foods. Beverages Caffeinated drinks. Alcohol. Seasonings and condiments Strongly flavored seasonings or condiments. Hot sauce. Salsa. Other foods Spicy foods. Fried foods. Sour foods, such as pickled or fermented foods. Foods with high sugar content. Foods high in fiber. The items listed above may not be a complete list of foods and beverages to avoid. Contact a dietitian for more information. Summary  A bland diet consists of foods that are often soft and do not have a lot of fat, fiber, or extra seasonings.  Foods without fat, fiber, or seasoning are easier for the body to digest.  Check with your health care provider to see how long you should follow this diet plan. It is not meant to be followed for long periods. This information is not intended to replace advice given to you by your health care provider. Make sure you discuss any questions you have with your health care provider. Document Revised: 10/05/2017 Document Reviewed: 10/05/2017 Elsevier Patient Education  2020 [employer].

## 2020-07-19 ENCOUNTER — Encounter: Payer: Self-pay | Admitting: Family

## 2020-07-19 ENCOUNTER — Encounter: Payer: Self-pay | Admitting: Family Medicine

## 2020-07-19 ENCOUNTER — Encounter: Payer: Self-pay | Admitting: Medical

## 2020-07-20 DIAGNOSIS — R1013 Epigastric pain: Secondary | ICD-10-CM | POA: Insufficient documentation

## 2020-07-20 DIAGNOSIS — R42 Dizziness and giddiness: Secondary | ICD-10-CM | POA: Insufficient documentation

## 2020-07-20 DIAGNOSIS — R110 Nausea: Secondary | ICD-10-CM | POA: Insufficient documentation

## 2020-07-20 NOTE — Assessment & Plan Note (Signed)
Stop buspar and f/u with pcp

## 2020-07-20 NOTE — Assessment & Plan Note (Signed)
?   gerd or GB We were able to get the pt in sooner with surgery and she was placed on wait list  Go to ER if symptoms worsen

## 2020-07-20 NOTE — Assessment & Plan Note (Signed)
ppi per orders F/u pcp if no relief

## 2020-07-21 ENCOUNTER — Ambulatory Visit: Payer: Medicaid Other | Admitting: Medical

## 2020-07-21 MED ORDER — MECLIZINE HCL 25 MG PO TABS: 25.0000 mg | ORAL_TABLET | Freq: Three times a day (TID) | ORAL | 0 refills | Status: DC | PRN

## 2020-07-21 MED ORDER — FAMOTIDINE 10 MG PO TABS: 10.0000 mg | ORAL_TABLET | Freq: Two times a day (BID) | ORAL | 2 refills | Status: DC

## 2020-07-21 NOTE — Telephone Encounter (Signed)
Duplicate message. 

## 2020-07-21 NOTE — Telephone Encounter (Signed)
Take pepcid 10 bid #60  2 refills

## 2020-07-31 ENCOUNTER — Ambulatory Visit: Payer: Self-pay | Admitting: Surgery

## 2020-07-31 DIAGNOSIS — K8050 Calculus of bile duct without cholangitis or cholecystitis without obstruction: Secondary | ICD-10-CM | POA: Diagnosis not present

## 2020-08-03 ENCOUNTER — Other Ambulatory Visit: Payer: Self-pay | Admitting: Medical

## 2020-08-15 NOTE — Progress Notes (Signed)
[id number] [place], [place] - [address] [place] [address] Phone: [phone] Fax: [phone]      Your procedure is scheduled on 08/22/2020.  Report to [facility] Main Entrance "A" at 05:30 A.M., and check in at the Admitting office.  Call this number if you have problems the morning of surgery:  [phone]  Call [phone] if you have any questions prior to your surgery date Monday-Friday 8am-4pm    Remember:  Do not eat after midnight the night before your surgery  You may drink clear liquids until 04:30am the morning of your surgery.   Clear liquids allowed are: Water, Non-Citrus Juices (without pulp), Carbonated Beverages, Clear Tea, Black Coffee Only, and Gatorade    Take these medicines the morning of surgery with A SIP OF WATER  levothyroxine (SYNTHROID) famotidine (PEPCID)- as needed   As of today, STOP taking any Aspirin (unless otherwise instructed by your surgeon) Aleve, Naproxen, Ibuprofen, Motrin, Advil, Goody's, BC's, all herbal medications, fish oil, and all vitamins.                      Do not wear jewelry, make up, or nail polish            Do not wear lotions, powders, perfumes/colognes, or deodorant.            Do not shave 48 hours prior to surgery.             Do not bring valuables to the hospital.            [facility] is not responsible for any belongings or valuables.  Do NOT Smoke (Tobacco/Vaping) or drink Alcohol 24 hours prior to your procedure If you use a CPAP at night, you may bring all equipment for your overnight stay.   Contacts, glasses, dentures or bridgework may not be worn into surgery.      For patients admitted to the hospital, discharge time will be determined by your treatment team.   Patients discharged the day of surgery will not be allowed to drive home, and someone needs to stay with them for 24 hours.    Special instructions:   [facility]- Preparing For Surgery  Before  surgery, you can play an important role. Because skin is not sterile, your skin needs to be as free of germs as possible. You can reduce the number of germs on your skin by washing with CHG (chlorahexidine gluconate) Soap before surgery.  CHG is an antiseptic cleaner which kills germs and bonds with the skin to continue killing germs even after washing.    Oral Hygiene is also important to reduce your risk of infection.  Remember - BRUSH YOUR TEETH THE MORNING OF SURGERY WITH YOUR REGULAR TOOTHPASTE  Please do not use if you have an allergy to CHG or antibacterial soaps. If your skin becomes reddened/irritated stop using the CHG.  Do not shave (including legs and underarms) for at least 48 hours prior to first CHG shower. It is OK to shave your face.  Please follow these instructions carefully.   1. Shower the NIGHT BEFORE SURGERY and the MORNING OF SURGERY with CHG Soap.   2. If you chose to wash your hair, wash your hair first as usual with your normal shampoo.  3. After you shampoo, rinse your hair and body thoroughly to remove the shampoo.  4. Use CHG as you would any other liquid soap. You can  apply CHG directly to the skin and wash gently with a scrungie or a clean washcloth.   5. Apply the CHG Soap to your body ONLY FROM THE NECK DOWN.  Do not use on open wounds or open sores. Avoid contact with your eyes, ears, mouth and genitals (private parts). Wash Face and genitals (private parts)  with your normal soap.   6. Wash thoroughly, paying special attention to the area where your surgery will be performed.  7. Thoroughly rinse your body with warm water from the neck down.  8. DO NOT shower/wash with your normal soap after using and rinsing off the CHG Soap.  9. Pat yourself dry with a CLEAN TOWEL.  10. Wear CLEAN PAJAMAS to bed the night before surgery  11. Place CLEAN SHEETS on your bed the night of your first shower and DO NOT SLEEP WITH PETS.   Day of Surgery: Wear  Clean/Comfortable clothing the morning of surgery Do not apply any deodorants/lotions.   Remember to brush your teeth WITH YOUR REGULAR TOOTHPASTE.   Please read over the following fact sheets that you were given.

## 2020-08-16 ENCOUNTER — Other Ambulatory Visit: Payer: Self-pay | Admitting: Family

## 2020-08-18 ENCOUNTER — Other Ambulatory Visit: Payer: Self-pay

## 2020-08-18 ENCOUNTER — Encounter (HOSPITAL_COMMUNITY)
Admission: RE | Admit: 2020-08-18 | Discharge: 2020-08-18 | Disposition: A | Discharge status: Home / Self Care | Payer: Medicaid Other | Source: Ambulatory Visit | Attending: Surgery | Admitting: Surgery

## 2020-08-18 ENCOUNTER — Encounter (HOSPITAL_COMMUNITY): Payer: Self-pay

## 2020-08-18 DIAGNOSIS — Z01818 Encounter for other preprocedural examination: Secondary | ICD-10-CM | POA: Diagnosis not present

## 2020-08-18 HISTORY — DX: Nausea with vomiting, unspecified: R11.2

## 2020-08-18 HISTORY — DX: Other specified postprocedural states: Z98.890

## 2020-08-18 HISTORY — DX: Gastro-esophageal reflux disease without esophagitis: K21.9

## 2020-08-18 LAB — CBC
HCT: 41.3 % (ref 36.0–46.0)
Hemoglobin: 13.1 g/dL (ref 12.0–15.0)
MCH: 29.1 pg (ref 26.0–34.0)
MCHC: 31.7 g/dL (ref 30.0–36.0)
MCV: 91.8 fL (ref 80.0–100.0)
Platelets: 266 10*3/uL (ref 150–400)
RBC: 4.5 MIL/uL (ref 3.87–5.11)
RDW: 12.7 % (ref 11.5–15.5)
WBC: 7.4 10*3/uL (ref 4.0–10.5)
nRBC: 0 % (ref 0.0–0.2)

## 2020-08-18 NOTE — Progress Notes (Addendum)
PCP - Sandford Craze Cardiologist - denies  PPM/ICD - denies   Chest x-ray - 08/22/2017 EKG - n/a Stress Test - denies ECHO - denies Cardiac Cath - denies  Sleep Study - denies   Patient instructed to hold all Aspirin, NSAID's, herbal medications, fish oil and vitamins 7 days prior to surgery.   ERAS Protcol -yes   COVID TEST- 08/19/2020   Anesthesia review: no  Patient denies shortness of breath, fever, cough and chest pain at PAT appointment   All instructions explained to the patient, with a verbal understanding of the material. Patient agrees to go over the instructions while at home for a better understanding. Patient also instructed to self quarantine after being tested for COVID-19. The opportunity to ask questions was provided.

## 2020-08-19 ENCOUNTER — Other Ambulatory Visit (HOSPITAL_COMMUNITY)
Admission: RE | Admit: 2020-08-19 | Discharge: 2020-08-19 | Disposition: A | Discharge status: Home / Self Care | Payer: Medicaid Other | Source: Ambulatory Visit | Attending: Surgery | Admitting: Surgery

## 2020-08-19 DIAGNOSIS — Z01812 Encounter for preprocedural laboratory examination: Secondary | ICD-10-CM | POA: Diagnosis not present

## 2020-08-19 DIAGNOSIS — Z20822 Contact with and (suspected) exposure to covid-19: Secondary | ICD-10-CM | POA: Diagnosis not present

## 2020-08-19 LAB — SARS CORONAVIRUS 2 (TAT 6-24 HRS): SARS Coronavirus 2: NEGATIVE

## 2020-08-21 NOTE — Anesthesia Preprocedure Evaluation (Addendum)
Anesthesia Evaluation  Patient identified by MRN, date of birth, ID band Patient awake    Reviewed: Allergy & Precautions, NPO status , Patient's Chart, lab work & pertinent test results  History of Anesthesia Complications (+) PONV and history of anesthetic complications  Airway Mallampati: II  TM Distance: >3 FB Neck ROM: Full    Dental  (+) Dental Advisory Given, Teeth Intact   Pulmonary neg pulmonary ROS,    Pulmonary exam normal        Cardiovascular negative cardio ROS Normal cardiovascular exam     Neuro/Psych PSYCHIATRIC DISORDERS Anxiety Depression negative neurological ROS     GI/Hepatic Neg liver ROS, GERD  Controlled,  Endo/Other  Hypothyroidism  Obesity   Renal/GU negative Renal ROS     Musculoskeletal negative musculoskeletal ROS (+)   Abdominal   Peds  Hematology negative hematology ROS (+)   Anesthesia Other Findings Covid test negative   Reproductive/Obstetrics  s/p tubal ligation                             Anesthesia Physical Anesthesia Plan  ASA: II  Anesthesia Plan: General   Post-op Pain Management:    Induction: Intravenous  PONV Risk Score and Plan: 4 or greater and Treatment may vary due to age or medical condition, Ondansetron, Midazolam and Dexamethasone  Airway Management Planned: Oral ETT  Additional Equipment: None  Intra-op Plan:   Post-operative Plan: Extubation in OR  Informed Consent: I have reviewed the patients History and Physical, chart, labs and discussed the procedure including the risks, benefits and alternatives for the proposed anesthesia with the patient or authorized representative who has indicated his/her understanding and acceptance.     Dental advisory given  Plan Discussed with: CRNA and Anesthesiologist  Anesthesia Plan Comments:        Anesthesia Quick Evaluation

## 2020-08-22 ENCOUNTER — Encounter (HOSPITAL_COMMUNITY)
Admission: RE | Disposition: A | Discharge status: Home / Self Care | Payer: Self-pay | Source: Home / Self Care | Attending: Surgery

## 2020-08-22 ENCOUNTER — Ambulatory Visit (HOSPITAL_COMMUNITY)
Admission: RE | Admit: 2020-08-22 | Discharge: 2020-08-22 | Disposition: A | Discharge status: Home / Self Care | Payer: Medicaid Other | Attending: Surgery | Admitting: Surgery

## 2020-08-22 ENCOUNTER — Ambulatory Visit (HOSPITAL_COMMUNITY): Payer: Medicaid Other | Admitting: Certified Registered"

## 2020-08-22 ENCOUNTER — Other Ambulatory Visit: Payer: Self-pay

## 2020-08-22 ENCOUNTER — Encounter (HOSPITAL_COMMUNITY): Payer: Self-pay | Admitting: Surgery

## 2020-08-22 DIAGNOSIS — F418 Other specified anxiety disorders: Secondary | ICD-10-CM | POA: Diagnosis not present

## 2020-08-22 DIAGNOSIS — Z79899 Other long term (current) drug therapy: Secondary | ICD-10-CM | POA: Insufficient documentation

## 2020-08-22 DIAGNOSIS — Z79890 Hormone replacement therapy (postmenopausal): Secondary | ICD-10-CM | POA: Insufficient documentation

## 2020-08-22 DIAGNOSIS — K811 Chronic cholecystitis: Secondary | ICD-10-CM | POA: Insufficient documentation

## 2020-08-22 DIAGNOSIS — K8010 Calculus of gallbladder with chronic cholecystitis without obstruction: Secondary | ICD-10-CM | POA: Diagnosis not present

## 2020-08-22 DIAGNOSIS — K219 Gastro-esophageal reflux disease without esophagitis: Secondary | ICD-10-CM | POA: Diagnosis not present

## 2020-08-22 DIAGNOSIS — E039 Hypothyroidism, unspecified: Secondary | ICD-10-CM | POA: Diagnosis not present

## 2020-08-22 HISTORY — PX: CHOLECYSTECTOMY: SHX55

## 2020-08-22 LAB — POCT PREGNANCY, URINE: Preg Test, Ur: NEGATIVE

## 2020-08-22 SURGERY — LAPAROSCOPIC CHOLECYSTECTOMY
Anesthesia: General | Site: Abdomen

## 2020-08-22 MED ORDER — BUPIVACAINE HCL (PF) 0.25 % IJ SOLN
INTRAMUSCULAR | Status: AC
  Filled 2020-08-22: qty 30

## 2020-08-22 MED ORDER — CHLORHEXIDINE GLUCONATE CLOTH 2 % EX PADS: 6.0000 | MEDICATED_PAD | Freq: Once | CUTANEOUS | Status: DC

## 2020-08-22 MED ORDER — ORAL CARE MOUTH RINSE: 15.0000 mL | Freq: Once | OROMUCOSAL | Status: AC

## 2020-08-22 MED ORDER — OXYCODONE HCL 5 MG PO TABS: 5.0000 mg | ORAL_TABLET | Freq: Once | ORAL | Status: DC | PRN

## 2020-08-22 MED ORDER — BUPIVACAINE HCL 0.25 % IJ SOLN
INTRAMUSCULAR | Status: DC | PRN
  Administered 2020-08-22: 30 mL

## 2020-08-22 MED ORDER — SODIUM CHLORIDE 0.9 % IR SOLN
Status: DC | PRN
  Administered 2020-08-22: 1

## 2020-08-22 MED ORDER — LACTATED RINGERS IV SOLN: INTRAVENOUS | Status: DC

## 2020-08-22 MED ORDER — SUGAMMADEX SODIUM 200 MG/2ML IV SOLN
INTRAVENOUS | Status: DC | PRN
  Administered 2020-08-22: 200 mg via INTRAVENOUS

## 2020-08-22 MED ORDER — FENTANYL CITRATE (PF) 100 MCG/2ML IJ SOLN
INTRAMUSCULAR | Status: AC
  Filled 2020-08-22: qty 2

## 2020-08-22 MED ORDER — IBUPROFEN 800 MG PO TABS: 800.0000 mg | ORAL_TABLET | Freq: Three times a day (TID) | ORAL | 0 refills | Status: AC

## 2020-08-22 MED ORDER — GABAPENTIN 300 MG PO CAPS
300.0000 mg | ORAL_CAPSULE | ORAL | Status: AC
  Administered 2020-08-22: 300 mg via ORAL
  Filled 2020-08-22: qty 1

## 2020-08-22 MED ORDER — 0.9 % SODIUM CHLORIDE (POUR BTL) OPTIME
TOPICAL | Status: DC | PRN
  Administered 2020-08-22: 1000 mL

## 2020-08-22 MED ORDER — CHLORHEXIDINE GLUCONATE 0.12 % MT SOLN
15.0000 mL | Freq: Once | OROMUCOSAL | Status: AC
  Administered 2020-08-22: 15 mL via OROMUCOSAL
  Filled 2020-08-22: qty 15

## 2020-08-22 MED ORDER — DEXAMETHASONE SODIUM PHOSPHATE 10 MG/ML IJ SOLN
INTRAMUSCULAR | Status: AC
  Filled 2020-08-22: qty 1

## 2020-08-22 MED ORDER — FENTANYL CITRATE (PF) 100 MCG/2ML IJ SOLN
25.0000 ug | INTRAMUSCULAR | Status: DC | PRN
  Administered 2020-08-22: 25 ug via INTRAVENOUS

## 2020-08-22 MED ORDER — GLYCOPYRROLATE 0.2 MG/ML IJ SOLN
INTRAMUSCULAR | Status: DC | PRN
  Administered 2020-08-22: .2 mg via INTRAVENOUS

## 2020-08-22 MED ORDER — LIDOCAINE HCL (PF) 2 % IJ SOLN
INTRAMUSCULAR | Status: AC
  Filled 2020-08-22: qty 5

## 2020-08-22 MED ORDER — OXYCODONE HCL 5 MG/5ML PO SOLN: 5.0000 mg | Freq: Once | ORAL | Status: DC | PRN

## 2020-08-22 MED ORDER — ACETAMINOPHEN 325 MG PO TABS: 650.0000 mg | ORAL_TABLET | ORAL | 2 refills | Status: DC | PRN

## 2020-08-22 MED ORDER — EPHEDRINE 5 MG/ML INJ
INTRAVENOUS | Status: AC
  Filled 2020-08-22: qty 10

## 2020-08-22 MED ORDER — DEXAMETHASONE SODIUM PHOSPHATE 10 MG/ML IJ SOLN
INTRAMUSCULAR | Status: DC | PRN
  Administered 2020-08-22: 8 mg via INTRAVENOUS

## 2020-08-22 MED ORDER — ROCURONIUM BROMIDE 10 MG/ML (PF) SYRINGE
PREFILLED_SYRINGE | INTRAVENOUS | Status: AC
  Filled 2020-08-22: qty 10

## 2020-08-22 MED ORDER — ACETAMINOPHEN 500 MG PO TABS
1000.0000 mg | ORAL_TABLET | ORAL | Status: AC
  Administered 2020-08-22: 1000 mg via ORAL
  Filled 2020-08-22: qty 2

## 2020-08-22 MED ORDER — MIDAZOLAM HCL 2 MG/2ML IJ SOLN
INTRAMUSCULAR | Status: DC | PRN
  Administered 2020-08-22: 2 mg via INTRAVENOUS

## 2020-08-22 MED ORDER — LIDOCAINE 2% (20 MG/ML) 5 ML SYRINGE
INTRAMUSCULAR | Status: DC | PRN
  Administered 2020-08-22: 60 mg via INTRAVENOUS

## 2020-08-22 MED ORDER — ONDANSETRON HCL 4 MG/2ML IJ SOLN
INTRAMUSCULAR | Status: AC
  Filled 2020-08-22: qty 2

## 2020-08-22 MED ORDER — CELECOXIB 200 MG PO CAPS
400.0000 mg | ORAL_CAPSULE | ORAL | Status: AC
  Administered 2020-08-22: 400 mg via ORAL
  Filled 2020-08-22: qty 2

## 2020-08-22 MED ORDER — FENTANYL CITRATE (PF) 250 MCG/5ML IJ SOLN
INTRAMUSCULAR | Status: AC
  Filled 2020-08-22: qty 5

## 2020-08-22 MED ORDER — EPHEDRINE SULFATE-NACL 50-0.9 MG/10ML-% IV SOSY
PREFILLED_SYRINGE | INTRAVENOUS | Status: DC | PRN
  Administered 2020-08-22: 10 mg via INTRAVENOUS

## 2020-08-22 MED ORDER — ATROPINE SULFATE 0.4 MG/ML IJ SOLN
INTRAMUSCULAR | Status: DC | PRN
  Administered 2020-08-22: .4 mg via INTRAVENOUS
  Administered 2020-08-22: .8 mg via INTRAVENOUS

## 2020-08-22 MED ORDER — MIDAZOLAM HCL 2 MG/2ML IJ SOLN
INTRAMUSCULAR | Status: AC
  Filled 2020-08-22: qty 2

## 2020-08-22 MED ORDER — HYDROCODONE-ACETAMINOPHEN 5-325 MG PO TABS: 1.0000 | ORAL_TABLET | ORAL | 0 refills | Status: DC | PRN

## 2020-08-22 MED ORDER — ROCURONIUM BROMIDE 10 MG/ML (PF) SYRINGE
PREFILLED_SYRINGE | INTRAVENOUS | Status: DC | PRN
  Administered 2020-08-22: 50 mg via INTRAVENOUS

## 2020-08-22 MED ORDER — PROPOFOL 10 MG/ML IV BOLUS
INTRAVENOUS | Status: DC | PRN
  Administered 2020-08-22: 200 mg via INTRAVENOUS

## 2020-08-22 MED ORDER — PROMETHAZINE HCL 25 MG/ML IJ SOLN: 6.2500 mg | INTRAMUSCULAR | Status: DC | PRN

## 2020-08-22 MED ORDER — PROPOFOL 10 MG/ML IV BOLUS
INTRAVENOUS | Status: AC
  Filled 2020-08-22: qty 40

## 2020-08-22 MED ORDER — ONDANSETRON HCL 4 MG/2ML IJ SOLN
INTRAMUSCULAR | Status: DC | PRN
  Administered 2020-08-22: 4 mg via INTRAVENOUS

## 2020-08-22 MED ORDER — FENTANYL CITRATE (PF) 250 MCG/5ML IJ SOLN
INTRAMUSCULAR | Status: DC | PRN
  Administered 2020-08-22 (×3): 50 ug via INTRAVENOUS
  Administered 2020-08-22: 100 ug via INTRAVENOUS

## 2020-08-22 MED ORDER — CEFAZOLIN SODIUM-DEXTROSE 2-4 GM/100ML-% IV SOLN
2.0000 g | INTRAVENOUS | Status: AC
  Administered 2020-08-22: 2 g via INTRAVENOUS
  Filled 2020-08-22: qty 100

## 2020-08-22 SURGICAL SUPPLY — 39 items
APPLIER CLIP 5 13 M/L LIGAMAX5 (MISCELLANEOUS) ×2
CANISTER SUCT 3000ML PPV (MISCELLANEOUS) ×2 IMPLANT
CHLORAPREP W/TINT 26 (MISCELLANEOUS) ×2 IMPLANT
CLIP APPLIE 5 13 M/L LIGAMAX5 (MISCELLANEOUS) ×1 IMPLANT
COVER SURGICAL LIGHT HANDLE (MISCELLANEOUS) ×2 IMPLANT
COVER WAND RF STERILE (DRAPES) ×2 IMPLANT
DERMABOND ADVANCED (GAUZE/BANDAGES/DRESSINGS) ×1
DERMABOND ADVANCED .7 DNX12 (GAUZE/BANDAGES/DRESSINGS) ×1 IMPLANT
ELECT REM PT RETURN 9FT ADLT (ELECTROSURGICAL) ×2
ELECTRODE REM PT RTRN 9FT ADLT (ELECTROSURGICAL) ×1 IMPLANT
GLOVE BIO SURGEON STRL SZ7.5 (GLOVE) ×2 IMPLANT
GLOVE BIOGEL PI IND STRL 8 (GLOVE) ×1 IMPLANT
GLOVE BIOGEL PI INDICATOR 8 (GLOVE) ×1
GOWN STRL REUS W/ TWL LRG LVL3 (GOWN DISPOSABLE) ×2 IMPLANT
GOWN STRL REUS W/ TWL XL LVL3 (GOWN DISPOSABLE) ×1 IMPLANT
GOWN STRL REUS W/TWL LRG LVL3 (GOWN DISPOSABLE) ×2
GOWN STRL REUS W/TWL XL LVL3 (GOWN DISPOSABLE) ×1
GRASPER SUT TROCAR 14GX15 (MISCELLANEOUS) ×2 IMPLANT
KIT BASIN OR (CUSTOM PROCEDURE TRAY) ×2 IMPLANT
KIT TURNOVER KIT B (KITS) ×2 IMPLANT
NEEDLE INSUFFLATION 14GA 120MM (NEEDLE) ×2 IMPLANT
NS IRRIG 1000ML POUR BTL (IV SOLUTION) ×2 IMPLANT
PAD ARMBOARD 7.5X6 YLW CONV (MISCELLANEOUS) ×2 IMPLANT
POUCH RETRIEVAL ECOSAC 10 (ENDOMECHANICALS) IMPLANT
POUCH RETRIEVAL ECOSAC 10MM (ENDOMECHANICALS)
POUCH SPECIMEN RETRIEVAL 10MM (ENDOMECHANICALS) ×2 IMPLANT
SCISSORS LAP 5X35 DISP (ENDOMECHANICALS) ×2 IMPLANT
SET IRRIG TUBING LAPAROSCOPIC (IRRIGATION / IRRIGATOR) ×2 IMPLANT
SET TUBE SMOKE EVAC HIGH FLOW (TUBING) ×2 IMPLANT
SLEEVE ENDOPATH XCEL 5M (ENDOMECHANICALS) ×4 IMPLANT
SPECIMEN JAR SMALL (MISCELLANEOUS) ×2 IMPLANT
SUT MNCRL AB 4-0 PS2 18 (SUTURE) ×2 IMPLANT
TOWEL GREEN STERILE (TOWEL DISPOSABLE) ×2 IMPLANT
TOWEL GREEN STERILE FF (TOWEL DISPOSABLE) ×2 IMPLANT
TRAY LAPAROSCOPIC MC (CUSTOM PROCEDURE TRAY) ×2 IMPLANT
TROCAR XCEL NON-BLD 11X100MML (ENDOMECHANICALS) ×2 IMPLANT
TROCAR XCEL NON-BLD 5MMX100MML (ENDOMECHANICALS) ×2 IMPLANT
WARMER LAPAROSCOPE (MISCELLANEOUS) ×2 IMPLANT
WATER STERILE IRR 1000ML POUR (IV SOLUTION) ×2 IMPLANT

## 2020-08-22 NOTE — Discharge Instructions (Signed)
° °CHOLECYSTECTOMY POST OPERATIVE INSTRUCTIONS ° °Thinking Clearly  °• The anesthesia may cause you to feel different for 1 or 2 days. Do not drive, drink alcohol, or make any big decisions for at least 2 days. ° °Nutrition °• When you wake up, you will be able to drink small amounts of liquid. If you do not feel sick, you can slowly advance your diet to regular foods. °• Continue to drink lots of fluids, usually about 8 to 10 glasses per day. °• Eat a high-fiber diet so you don’t strain during bowel movements. °• High-Fiber Foods °o Foods high in fiber include beans, bran cereals and whole-grain breads, peas, dried fruit (figs, apricots, and dates), raspberries, blackberries, strawberries, sweet corn, broccoli, baked potatoes with skin, plums, pears, apples, greens, and nuts. °Activity °• Slowly increase your activity. Be sure to get up and walk every hour or so to prevent blood clots. °• No heavy lifting or strenuous activity for 4 weeks following surgery to prevent hernias at your incision sites °• It is normal to feel tired. You may need more sleep than usual.  Get your rest but make sure to get up and move around frequently to prevent blood clots and pneumonia. ° °Work and Return to School °• You can go back to work when you feel well enough. Discuss the timing with your surgeon. °• You can usually go back to school or work 1 week after an operation. °• If your work requires heavy lifting or strenuous activity you need to be placed on light duty for 4 weeks following surgery. °• You can return to gym class, sports or other physical activities 4 weeks after surgery. ° °Wound Care °• Always wash your hands before and after touching near your incision site. °• Do not soak in a bathtub until cleared at your follow up appointment. You may take a shower 24 hours after surgery. °• A small amount of drainage from the incision is normal. If the drainage is thick and yellow or the site is red, you may have an infection,  so call your surgeon. °• If you have a drain in one of your incisions, it will be taken out in office when the drainage stops. °• Steri-Strips will fall off in 7 to 10 days or they will be removed during your first office visit. °• If you have dermabond glue covering over the incision, allow the glue to flake off on its own. °• Avoid wearing tight or rough clothing. It may rub your incisions and make it harder for them to heal. °• Protect the new skin, especially from the sun. The sun can burn and cause darker scarring. °• Your scar will heal in about 4 to 6 weeks and will become softer and continue to fade over the next year.  The cosmetic appearance of the incisions will improve over the course of the first year after surgery. °• Sensation around your incision will return in a few weeks or months. ° °Bowel Movements °• After intestinal surgery, you may have loose watery stools for several days. If watery diarrhea lasts longer than 3 days, contact your surgeon. °• Pain medication (narcotics) can cause constipation. Increase the fiber in your diet with high-fiber foods if you are constipated. You can take an over the counter stool softener like Colace to avoid constipation.  Additional over the counter medications can also be used if Colace isn't sufficient (for example, Milk of Magnesia or Miralax). ° °Pain °• The amount of pain   is different for each person. Some people need only 1 to 3 doses of pain control medication, while others need more. °• Take alternating doses of tylenol and ibuprofen around the clock for the first five days following surgery.  This will provide a baseline of pain control and help with inflammation.  Take the narcotic pain medication in addition if needed for severe pain. ° °Contact Your Surgeon at [phone], if you have: °• Pain in your right upper abdomen like a gallbladder attack. °• Pain that will not go away °• Pain that gets worse °• A fever of more than 101°F (38.3ºC) °• Repeated  vomiting °• Swelling, redness, bleeding, or bad-smelling drainage from your wound site °• Strong abdominal pain °• No bowel movement or unable to pass gas for 3 days °• Watery diarrhea lasting longer than 3 days ° °Pain Control °• The goal of pain control is to minimize pain, keep you moving and help you heal. Your surgical team will work with you on your pain plan. Most often a combination of therapies and medications are used to control your pain. You may also be given medication (local anesthetic) at the surgical site. This may help control your pain for several days. °• Extreme pain puts extra stress on your body at a time when your body needs to focus on healing. Do not wait until your pain has reached a level “10” or is unbearable before telling your doctor or nurse. It is much easier to control pain before it becomes severe. °• Following a laparoscopic procedure, pain is sometimes felt in the shoulder. This is due to the gas inserted into your abdomen during the procedure. Moving and walking helps to decrease the gas and the right shoulder pain. ° °• Use the guide below for ways to manage your post-operative pain. Learn more by going to facs.org/safepaincontrol. ° °How Intense Is My Pain Common Therapies to Feel Better  ° ° ° ° ° °I hardly notice my pain, and it does not interfere with my activities. ° °I notice my pain and it distracts me, but I can still do activities (sitting up, walking, standing).  °Non-Medication Therapies ° °Ice (in a bag, applied over clothing at the surgical site), elevation, rest, meditation, massage, distraction (music, TV, play) walking and mild exercise °Splinting the abdomen with pillows °+ ° °Non-Opioid Medications °Acetaminophen (Tylenol®) °Non-steroidal anti-inflammatory drugs (NSAIDS) Aspirin, Ibuprofen (Motrin®, Advil®) Naproxen (Aleve®) °Take these as needed, when you feel pain. Both acetaminophen and NSAIDs help to decrease pain and swelling (inflammation). °  ° ° ° °My  pain is hard to ignore and is more noticeable even when I rest. ° °My pain interferes with my usual activities.  °Non-Medication Therapies ° °+ ° °Non-Opioid medications  °Take on a regular schedule (around-the-clock) instead of as needed. (For example, Tylenol® every 6 hours at 9:00 am, 3:00 pm, 9:00 pm, 3:00 am and Motrin® every 6 hours at 12:00 am, 6:00 am, 12:00 pm, 6:00 pm) °  ° ° ° ° ° ° °I am focused on my pain, and I am not doing my daily activities. ° °I am groaning in pain, and I cannot sleep. I am unable to do anything. ° °My pain is as bad as it could be, and nothing else matters.  °Non-Medication Therapies ° °+ ° °Around-the-Clock Non-Opioid Medications ° °+ ° °Short-acting opioids ° °Opioids should be used with other medications to manage severe pain. Opioids block pain and give a feeling of euphoria (feel high). °Addiction,   a serious side effect of opioids, is rare with short-term (a few days) use. ° °Examples of short-acting opioids include: Tramadol (Ultram®), Hydrocodone (Norco®, Vicodin®), Hydromorphone (Dilaudid®), Oxycodone (Oxycontin®) °  ° ° °The above directions have been adapted from the [employer] Surgical Patient Education Program.  Please refer to the ACS website if needed: https://www.facs.org/-/media/files/education/patient-ed/cholesys.ashx. ° ° °Shrihan Putt, MD °[facility] °[address], [address], [place], [place]  [address] ? ° [address] [address], [place], [place]   [address] °[phone] ? [phone] ? FAX [phone] °Web site: [email or web address] ° °

## 2020-08-22 NOTE — H&P (Signed)
Admitting Physician: Hyman Hopes Lashun Mccants  Service: General surgery  CC: abdominal pain  Subjective   HPI: Jessica Villa is an 39 y.o. female who is here for elective cholecystectomy.   Past Medical History:  Diagnosis Date  . Anemia    during pregnancy  . Depression   . Eczema   . GERD (gastroesophageal reflux disease)   . History of ovarian cyst   . Hx of varicella   . Hypothyroidism   . PONV (postoperative nausea and vomiting)   . Thyroid disease    hypothyroid    Past Surgical History:  Procedure Laterality Date  . CESAREAN SECTION N/A 02/18/2016   Procedure: CESAREAN SECTION;  Surgeon: Harold Hedge, MD;  Location: [facility] BIRTHING SUITES;  Service: Obstetrics;  Laterality: N/A;  . CESAREAN SECTION WITH BILATERAL TUBAL LIGATION N/A 01/10/2019   Procedure: CESAREAN SECTION WITH BILATERAL TUBAL LIGATION;  Surgeon: Harold Hedge, MD;  Location: [facility] LD ORS;  Service: Obstetrics;  Laterality: N/A;  Repeat edc 4/29 NKDA Tracey RNFA  . NO PAST SURGERIES    . WISDOM TOOTH EXTRACTION     in high school per patient    Family History  Problem Relation Age of Onset  . Depression Mother   . Migraines Mother   . Dementia Father   . Diabetes Father   . Depression Father   . Hypothyroidism Father   . Alcohol abuse Maternal Grandmother   . Alcohol abuse Maternal Grandfather   . Hypothyroidism Paternal Grandmother   . Heart disease Paternal Grandfather   . Diabetes Paternal Grandfather   . Skin cancer Paternal Grandfather   . Depression Sister   . ADD / ADHD Sister   . Irritable bowel syndrome Sister   . Panic disorder Brother   . Hypothyroidism Brother     Social:  reports that she has never smoked. She has never used smokeless tobacco. She reports current alcohol use. She reports that she does not use drugs.  Allergies: No Known Allergies  Medications: Current Outpatient Medications  Medication Instructions  . busPIRone (BUSPAR) 7.5 MG tablet TAKE 1 TABLET BY  MOUTH 2 TIMES DAILY.  . citalopram (CELEXA) 40 mg, Oral, Daily  . famotidine (PEPCID) 10 mg, Oral, 2 times daily before meals  . levothyroxine (SYNTHROID) 150 MCG tablet TAKE 1 TABLET EVERY DAY BEFORE BREAKFAST  . meclizine (ANTIVERT) 25 mg, Oral, 3 times daily PRN  . pantoprazole (PROTONIX) 40 mg, Oral, Daily  . triamcinolone ointment (KENALOG) 0.1 % 1 application, Topical, 2 times daily PRN    ROS - all of the below systems have been reviewed with the patient and positives are indicated with bold text General: chills, fever or night sweats Eyes: blurry vision or double vision ENT: epistaxis or sore throat Allergy/Immunology: itchy/watery eyes or nasal congestion Hematologic/Lymphatic: bleeding problems, blood clots or swollen lymph nodes Endocrine: temperature intolerance or unexpected weight changes Breast: new or changing breast lumps or nipple discharge Resp: cough, shortness of breath, or wheezing CV: chest pain or dyspnea on exertion GI: as per HPI GU: dysuria, trouble voiding, or hematuria MSK: joint pain or joint stiffness Neuro: TIA or stroke symptoms Derm: pruritus and skin lesion changes Psych: anxiety and depression  Objective   PE Blood pressure 101/67, pulse 77, temperature 98.4 F (36.9 C), temperature source Oral, resp. rate 17, height 5\' 6"  (1.676 m), weight 95.8 kg, last menstrual period 08/18/2020, SpO2 99 %, currently breastfeeding. Constitutional: NAD; conversant; no deformities Eyes: Moist conjunctiva; no lid lag; anicteric;  PERRL Neck: Trachea midline; no thyromegaly Lungs: Normal respiratory effort; no tactile fremitus CV: RRR; no palpable thrills; no pitting edema GI: Abd Soft, non-tender, non-distended; no palpable hepatosplenomegaly MSK: Normal range of motion of extremities; no clubbing/cyanosis Psychiatric: Appropriate affect; alert and oriented x3 Lymphatic: No palpable cervical or axillary lymphadenopathy  Results for orders placed or performed  during the hospital encounter of 08/22/20 (from the past 24 hour(s))  Pregnancy, urine POC     Status: None   Collection Time: 08/22/20  6:45 AM  Result Value Ref Range   Preg Test, Ur NEGATIVE NEGATIVE    Imaging Orders  No imaging studies ordered today     Assessment and Plan   Jessica Villa is an 39 y.o. female with cholecystitis.  Elective cholecystectomy today.  Risks, benefits, alteratives reviewed.  All questions answered.  Consent granted from patient to proceed.  Ivar Drape, M.D. General, Bariatric and Minimally Invasive Surgery  [facility] Use [email or web address] to contact on call provider

## 2020-08-22 NOTE — Transfer of Care (Signed)
Immediate Anesthesia Transfer of Care Note  Patient: Jessica Villa  Procedure(s) Performed: LAPAROSCOPIC CHOLECYSTECTOMY (N/A Abdomen)  Patient Location: PACU  Anesthesia Type:General  Level of Consciousness: drowsy and patient cooperative  Airway & Oxygen Therapy: Patient Spontanous Breathing and Patient connected to face mask oxygen  Post-op Assessment: Report given to RN and Post -op Vital signs reviewed and stable  Post vital signs: Reviewed and stable  Last Vitals:  Vitals Value Taken Time  BP 133/71 08/22/20 0839  Temp    Pulse 111 08/22/20 0839  Resp 18 08/22/20 0839  SpO2 100 % 08/22/20 0839  Vitals shown include unvalidated device data.  Last Pain:  Vitals:   08/22/20 0629  TempSrc:   PainSc: 0-No pain      Patients Stated Pain Goal: 2 (08/22/20 9480)  Complications: No complications documented.

## 2020-08-22 NOTE — Anesthesia Postprocedure Evaluation (Signed)
Anesthesia Post Note  Patient: Jessica Villa  Procedure(s) Performed: LAPAROSCOPIC CHOLECYSTECTOMY (N/A Abdomen)     Patient location during evaluation: PACU Anesthesia Type: General Level of consciousness: awake and alert Pain management: pain level controlled Vital Signs Assessment: post-procedure vital signs reviewed and stable Respiratory status: spontaneous breathing, nonlabored ventilation and respiratory function stable Cardiovascular status: blood pressure returned to baseline and stable Postop Assessment: no apparent nausea or vomiting Anesthetic complications: no   No complications documented.  Last Vitals:  Vitals:   08/22/20 0939 08/22/20 0940  BP: 122/88   Pulse: 66 74  Resp: 13 11  Temp:  (!) 36.2 C  SpO2: 100% 100%                  Beryle Lathe

## 2020-08-22 NOTE — Op Note (Signed)
Patient: Jessica Villa (11-Aug-1981, [id number])  Date of Surgery: 08/22/2020   Preoperative Diagnosis: CALCULOUS CHOLECYSTITIS   Postoperative Diagnosis: CALCULOUS CHOLECYSTITIS   Surgical Procedure: LAPAROSCOPIC CHOLECYSTECTOMY:    Operative Team Members:  Surgeon(s) and Role:    * Leimomi Zervas, Hyman Hopes, MD - Primary   Anesthesiologist: Beryle Lathe, MD CRNA: Modena Morrow, CRNA; Dorie Rank, CRNA   Anesthesia: General   Fluids:  Total I/O In: 1100 [I.V.:1000; IV Piggyback:100] Out: 20 [Blood:20]  Complications: * No complications entered in OR log *  Drains:  none   Specimen:  ID Type Source Tests Collected by Time Destination  1 : gallbladder Tissue PATH Gallbladder SURGICAL PATHOLOGY Jester Klingberg, Hyman Hopes, MD 08/22/2020 0728      Disposition:  PACU - hemodynamically stable.  Plan of Care: Discharge to home after PACU    Indications for Procedure: Jessica Villa is a 39 y.o. female who presented with abdominal pain.  History, physical and imaging was concerning for biliary colic with cholelithiasis.  Laparoscopic cholecystectomy was recommended for the patient.  The procedure itself, as well as the risks, benefits and alternatives were discussed with the patient.  Risks discussed included but were not limited to the risk of infection, bleeding, damage to nearby structures, need to convert to open procedure, incisional hernia, bile leak, common bile duct injury and the need for additional procedures or surgeries.  With this discussion complete and all questions answered the patient granted consent to proceed.  Findings: Inflamed gallbladder with dense adhesions to the omentum  Description of Procedure:   On the date stated above, the patient was taken to the operating room suite and placed in supine positioning.  Sequential compression devices were placed on the lower extremities to prevent blood clots.  General endotracheal anesthesia was induced. Preoperative  antibiotics (cefazolin) were given within 30 minutes of incision.  The patient's abdomen was prepped and draped in the usual sterile fashion.  A time-out was completed verifying the correct patient, procedure, positioning and equipment needed for the case.  We began by anesthetizing the skin with local anesthetic and then making a 5 mm incision just below the umbilicus.  We dissected through the subcutaneous tissues to the fascia.  The fascia was grasped and elevated using a Kocher clamp.  A Veress needle was inserted into the abdomen and the abdomen was insufflated to 15 mmHg.  A 5 mm trocar was inserted in this position under optical guidance and then the abdomen was inspected.  There was no trauma to the underlying viscera with initial trocar placement.  Any abnormal findings, other than inflammation in the right upper quadrant, are listed above in the findings section.  Three additional trocars were placed, one 12 mm trocar in the subxiphoid position, one 5 mm trocar in the midline epigastric area and one 73mm trocar in the right upper quadrant subcostally.  These were placed under direct vision without any trauma to the underlying viscera.    The patient was then placed in head up, left side down positioning.  The gallbladder was identified and dissected free from its attachments to the omentum allowing the duodenum to fall away.  The infundibulum of the gallbladder was dissected free working laterally to medially.  The cystic duct and cystic artery were dissected free from surrounding connective tissue.  The infundibulum of the gallbladder was dissected off the cystic plate.  A critical view of safety was obtained with the cystic duct and cystic artery being cleared of connective  tissues and clearly the only two structures entering into the gallbladder with the liver clearly visible behind.  Clips were then applied to the cystic duct and cystic artery and then these structures were divided.  The gallbladder  was dissected off the cystic plate, placed in an endocatch bag and removed from the 12 mm subxiphoid port site.  The clips were inspected and appeared effective.  The cystic plate was inspected and hemostasis was obtained using electrocautery.  The operative field was dry on final inspection.  Attention was turned to closure.  The 12 mm subxiphoid port site was closed using a 0-vicryl suture on a fascial suture passer.  The abdomen was desufflated.  The skin was closed using 4-0 monocryl and dermabond.  All sponge and needle counts were correct at the conclusion of the case.    Ivar Drape, MD General, Bariatric, & Minimally Invasive Surgery [facility], [place]

## 2020-08-22 NOTE — Anesthesia Procedure Notes (Signed)
Procedure Name: Intubation Date/Time: 08/22/2020 7:50 AM Performed by: Modena Morrow, CRNA Pre-anesthesia Checklist: Patient identified, Emergency Drugs available, Suction available and Patient being monitored Patient Re-evaluated:Patient Re-evaluated prior to induction Oxygen Delivery Method: Circle system utilized Preoxygenation: Pre-oxygenation with 100% oxygen Induction Type: IV induction Ventilation: Mask ventilation without difficulty Laryngoscope Size: Miller and 2 Grade View: Grade I Tube type: Oral Tube size: 7.0 mm Number of attempts: 2 Airway Equipment and Method: Stylet and Oral airway Placement Confirmation: ETT inserted through vocal cords under direct vision,  positive ETCO2 and breath sounds checked- equal and bilateral Secured at: 21 cm Tube secured with: Tape Dental Injury: Teeth and Oropharynx as per pre-operative assessment  Comments: Mal Amabile, MDA intubated patient.

## 2020-08-23 ENCOUNTER — Encounter (HOSPITAL_COMMUNITY): Payer: Self-pay | Admitting: Surgery

## 2020-08-25 LAB — SURGICAL PATHOLOGY

## 2020-11-28 ENCOUNTER — Ambulatory Visit: Payer: Medicaid Other | Admitting: Family

## 2020-12-17 DIAGNOSIS — R109 Unspecified abdominal pain: Secondary | ICD-10-CM | POA: Diagnosis not present

## 2020-12-17 DIAGNOSIS — R12 Heartburn: Secondary | ICD-10-CM | POA: Diagnosis not present

## 2020-12-17 DIAGNOSIS — R110 Nausea: Secondary | ICD-10-CM | POA: Diagnosis not present

## 2020-12-17 DIAGNOSIS — K921 Melena: Secondary | ICD-10-CM | POA: Diagnosis not present

## 2020-12-18 ENCOUNTER — Encounter: Payer: Self-pay | Admitting: Family

## 2020-12-22 ENCOUNTER — Ambulatory Visit: Payer: Medicaid Other | Admitting: Family

## 2020-12-23 ENCOUNTER — Ambulatory Visit: Payer: Medicaid Other | Admitting: Family

## 2020-12-23 ENCOUNTER — Other Ambulatory Visit: Payer: Self-pay

## 2020-12-23 VITALS — BP 100/57 | HR 66 | Temp 98.6°F | Resp 16 | Ht 66.0 in | Wt 212.0 lb

## 2020-12-23 DIAGNOSIS — F419 Anxiety disorder, unspecified: Secondary | ICD-10-CM | POA: Diagnosis not present

## 2020-12-23 DIAGNOSIS — E611 Iron deficiency: Secondary | ICD-10-CM

## 2020-12-23 DIAGNOSIS — R1013 Epigastric pain: Secondary | ICD-10-CM

## 2020-12-23 DIAGNOSIS — E039 Hypothyroidism, unspecified: Secondary | ICD-10-CM | POA: Diagnosis not present

## 2020-12-23 DIAGNOSIS — F32A Depression, unspecified: Secondary | ICD-10-CM | POA: Diagnosis not present

## 2020-12-23 DIAGNOSIS — K921 Melena: Secondary | ICD-10-CM | POA: Diagnosis not present

## 2020-12-23 LAB — CBC WITH DIFFERENTIAL/PLATELET
Basophils Absolute: 0 10*3/uL (ref 0.0–0.1)
Basophils Relative: 0.6 % (ref 0.0–3.0)
Eosinophils Absolute: 0.2 10*3/uL (ref 0.0–0.7)
Eosinophils Relative: 2.3 % (ref 0.0–5.0)
HCT: 33 % — ABNORMAL LOW (ref 36.0–46.0)
Hemoglobin: 10.9 g/dL — ABNORMAL LOW (ref 12.0–15.0)
Lymphocytes Relative: 32.8 % (ref 12.0–46.0)
Lymphs Abs: 2.2 10*3/uL (ref 0.7–4.0)
MCHC: 32.9 g/dL (ref 30.0–36.0)
MCV: 83.5 fl (ref 78.0–100.0)
Monocytes Absolute: 0.8 10*3/uL (ref 0.1–1.0)
Monocytes Relative: 12.2 % — ABNORMAL HIGH (ref 3.0–12.0)
Neutro Abs: 3.5 10*3/uL (ref 1.4–7.7)
Neutrophils Relative %: 52.1 % (ref 43.0–77.0)
Platelets: 303 10*3/uL (ref 150.0–400.0)
RBC: 3.96 Mil/uL (ref 3.87–5.11)
RDW: 13.5 % (ref 11.5–15.5)
WBC: 6.7 10*3/uL (ref 4.0–10.5)

## 2020-12-23 LAB — FERRITIN: Ferritin: 4.5 ng/mL — ABNORMAL LOW (ref 10.0–291.0)

## 2020-12-23 LAB — IRON: Iron: 35 ug/dL — ABNORMAL LOW (ref 42–145)

## 2020-12-23 LAB — LIPASE: Lipase: 22 U/L (ref 11.0–59.0)

## 2020-12-23 LAB — TSH: TSH: 6.95 u[IU]/mL — ABNORMAL HIGH (ref 0.35–4.50)

## 2020-12-23 MED ORDER — SUCRALFATE 1 GM/10ML PO SUSP: 1.0000 g | Freq: Three times a day (TID) | ORAL | 1 refills | Status: DC

## 2020-12-23 NOTE — Patient Instructions (Signed)
Please complete lab work prior to leaving. Return stool specimen at your earliest convenience. Continue protonix 40mg  twice daily. Add carafate 55ml 4x daily.

## 2020-12-23 NOTE — Progress Notes (Signed)
Subjective:    Patient ID: Jessica Villa, female    DOB: 11/21/1980, 40 y.o.   MRN: [id number]  HPI  Patient is a 40 yr old female who presents today with chief complaint of epigastric abdominal pain and nausea. She had cholecystectomy on 08/22/20.  This pain began around 12/10/20.  Worse with pressing on the area.  Reports that she had a black stool on 12/09/20. She did take pepto bismol on 12/13/20.  She reports that she had had nausea/dizziness/diarrhea went to bed.  Husband and daughter were sick with vomiting that week. Her symptoms continued but husband and daughter's symptoms resolved. She saw the surgeon and told not related to [place].  She sees GI. She was placed on a PPI which only helps for a few hours.  She reports that she had a negative H pylori breath test.  Last black stool was 3/29.  Reports that she tried to donate blood on 12/13/20.  Was told that her BP was low and iron count was "barely acceptable."    BP Readings from Last 3 Encounters:  12/23/20 (!) 100/57  08/22/20 122/88  08/18/20 112/68   She reports a considerable amount of stress.  The majority of her stress relates to custody issues that she and her husband are having with his ex-wife.  She reports that the ex-wife has accused her of saying and doing things that are not accurate.  This is also become a financial stressor for their family due to legal fees.   Review of Systems    see HPI  Past Medical History:  Diagnosis Date  . Anemia    during pregnancy  . Depression   . Eczema   . GERD (gastroesophageal reflux disease)   . History of ovarian cyst   . Hx of varicella   . Hypothyroidism   . PONV (postoperative nausea and vomiting)   . Thyroid disease    hypothyroid     Social History   Socioeconomic History  . Marital status: Married    Spouse name: Not on file  . Number of children: Not on file  . Years of education: Not on file  . Highest education level: Not on file  Occupational History  . Not  on file  Tobacco Use  . Smoking status: Never Smoker  . Smokeless tobacco: Never Used  Vaping Use  . Vaping Use: Never used  Substance and Sexual Activity  . Alcohol use: Yes    Comment: 1 cocktail or wine per month none since pregnancy  . Drug use: No  . Sexual activity: Yes    Partners: Male  Other Topics Concern  . Not on file  Social History Narrative   2 step children ( 6year and 42 year old) lives with pt and her husband.    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   Has masters degree in education leadership   Has worked as a Education officer, museum, crocheting, no pets      Social Determinants of Radio broadcast assistant Strain: Not on [employer] Insecurity: Not on file  Transportation Needs: Not on file  Physical Activity: Not on file  Stress: Not on file  Social Connections: Not on file  Intimate Partner Violence: Not on file    Past Surgical History:  Procedure Laterality Date  . CESAREAN SECTION N/A 02/18/2016   Procedure: CESAREAN SECTION;  Surgeon: Everlene Farrier, MD;  Location: [facility];  Service: Obstetrics;  Laterality: N/A;  . CESAREAN SECTION WITH BILATERAL TUBAL LIGATION N/A 01/10/2019   Procedure: CESAREAN SECTION WITH BILATERAL TUBAL LIGATION;  Surgeon: Everlene Farrier, MD;  Location: [facility] LD [facility]S;  Service: Obstetrics;  Laterality: N/A;  Repeat edc 4/29 NKDA Tracey RNFA  . CHOLECYSTECTOMY N/A 08/22/2020   Procedure: LAPAROSCOPIC CHOLECYSTECTOMY;  Surgeon: Felicie Morn, MD;  Location: [facility];  Service: General;  Laterality: N/A;  . NO PAST SURGERIES    . WISDOM TOOTH EXTRACTION     in high school per patient    Family History  Problem Relation Age of Onset  . Depression Mother   . Migraines Mother   . Dementia Father   . Diabetes Father   . Depression Father   . Hypothyroidism Father   . Alcohol abuse Maternal Grandmother   . Alcohol abuse Maternal Grandfather   . Hypothyroidism Paternal  Grandmother   . Heart disease Paternal Grandfather   . Diabetes Paternal Grandfather   . Skin cancer Paternal Grandfather   . Depression Sister   . ADD / ADHD Sister   . Irritable bowel syndrome Sister   . Panic disorder Brother   . Hypothyroidism Brother     No Known Allergies  Current Outpatient Medications on File Prior to Visit  Medication Sig Dispense Refill  . citalopram (CELEXA) 40 MG tablet Take 1 tablet (40 mg total) by mouth daily. (Patient taking differently: Take 40 mg by mouth at bedtime.) 90 tablet 1  . levothyroxine (SYNTHROID) 150 MCG tablet TAKE 1 TABLET EVERY DAY BEFORE BREAKFAST 30 tablet 5  . pantoprazole (PROTONIX) 40 MG tablet Take 1 tablet by mouth 2 (two) times daily.    Marland Kitchen triamcinolone ointment (KENALOG) 0.1 % Apply 1 application topically 2 (two) times daily as needed (eczema).      No current facility-administered medications on file prior to visit.    BP (!) 100/57 (BP Location: Right Arm, Patient Position: Sitting, Cuff Size: Large)   Pulse 66   Temp 98.6 F (37 C) (Oral)   Resp 16   Ht $R'5\' 6"'JU$  (1.676 m)   Wt 212 lb (96.2 kg)   SpO2 100%   BMI 34.22 kg/m    Objective:   Physical Exam Constitutional:      Appearance: She is well-developed.  Neck:     Thyroid: No thyromegaly.  Cardiovascular:     Rate and Rhythm: Normal rate and regular rhythm.     Heart sounds: Normal heart sounds. No murmur heard.   Pulmonary:     Effort: Pulmonary effort is normal. No respiratory distress.     Breath sounds: Normal breath sounds. No wheezing.  Abdominal:     General: Bowel sounds are normal.     Palpations: Abdomen is soft.     Tenderness: There is no abdominal tenderness.  Musculoskeletal:     Cervical back: Neck supple.  Skin:    General: Skin is warm and dry.  Neurological:     Mental Status: She is alert and oriented to person, place, and time.  Psychiatric:        Behavior: Behavior normal.        Thought Content: Thought content normal.         Judgment: Judgment normal.           Assessment & Plan:  Epigastric pain-I have recommended the following: We will obtain lipase, follow-up CBC due to recent finding of anemia/episode of dark stools.  It is not clear if the dark stools may have been due to side effect from Pepto-Bismol.  She will complete fecal occult blood testing and return at her earliest convenience.  We did discuss signs and symptoms of active GI bleed including frequent black tarry stools, weakness.  She is advised to go to the ER if she develops the symptoms.  In the meantime will have the patient continue Protonix 40 mg p.o. twice daily and add Carafate solution 10 mL p.o. 4 times daily.  A referral has already been placed to gastroenterology by her surgeon and she is awaiting an.  Anxiety/depression-the symptoms are recently worsened by current stressors.  It seems that her citalopram 40 mg is working well otherwise.  She will consider getting back in with a [place] counselor who she has met with previously.  Hypothyroidism-clinically stable on Synthroid 150 mcg p.o. daily.  Will obtain follow-up TSH.  Iron deficiency anemia-based on reports from her recent blood donation and review of CBC performed at her surgeon's office.  Will obtain iron studies for further evaluation. 32 minutes spent on today's visit.  The majority of this time was spent counseling the patient, interviewing her, reviewing outside records and formulating medical plan.   This visit occurred during the SARS-CoV-2 public health emergency.  Safety protocols were in place, including screening questions prior to the visit, additional usage of staff PPE, and extensive cleaning of exam room while observing appropriate contact time as indicated for disinfecting solutions.

## 2020-12-25 ENCOUNTER — Telehealth: Payer: Self-pay | Admitting: Family

## 2020-12-25 DIAGNOSIS — D509 Iron deficiency anemia, unspecified: Secondary | ICD-10-CM

## 2020-12-25 DIAGNOSIS — E039 Hypothyroidism, unspecified: Secondary | ICD-10-CM

## 2020-12-25 MED ORDER — LEVOTHYROXINE SODIUM 175 MCG PO TABS: 175.0000 ug | ORAL_TABLET | Freq: Every day | ORAL | 1 refills | Status: DC

## 2020-12-25 NOTE — Telephone Encounter (Signed)
Iron is low and she is mildly anemic.  I would like her to add Iron 325mg  by mouth every other day as well as a multivitamin with minerals if she is not already taking.   Thyroid medication needs to be increased to 175 mcg.  Rx sent, repeat TSH, CBC, Serum iron in 6 weeks and return ifob at her earliest convenience.

## 2020-12-25 NOTE — Telephone Encounter (Signed)
Patient advised of results, provider's advised, increasing thyroid medication and starting iron along with multivitamins. She verbalized understanding. She has follow up appointment on 02-03-2021

## 2020-12-26 ENCOUNTER — Other Ambulatory Visit (INDEPENDENT_AMBULATORY_CARE_PROVIDER_SITE_OTHER): Payer: Medicaid Other

## 2020-12-26 ENCOUNTER — Other Ambulatory Visit: Payer: Self-pay | Admitting: Family

## 2020-12-26 DIAGNOSIS — K921 Melena: Secondary | ICD-10-CM | POA: Diagnosis not present

## 2020-12-26 LAB — FECAL OCCULT BLOOD, IMMUNOCHEMICAL: Fecal Occult Bld: NEGATIVE

## 2020-12-26 MED ORDER — MULTI-VITAMIN/MINERALS PO TABS: 1.0000 | ORAL_TABLET | Freq: Every day | ORAL | Status: AC

## 2020-12-26 MED ORDER — SLOW FE 142 (45 FE) MG PO TBCR: 1.0000 | EXTENDED_RELEASE_TABLET | Freq: Every day | ORAL | Status: DC

## 2021-01-19 ENCOUNTER — Encounter: Payer: Self-pay | Admitting: Family

## 2021-01-19 ENCOUNTER — Encounter: Payer: Self-pay | Admitting: Nurse Practitioner

## 2021-01-19 NOTE — Telephone Encounter (Signed)
Called [facility] to get patient an appointment, she has been scheduled for 02-13-21  with Coleen Riley Kill PA. Patient notified of appointment date and time.

## 2021-02-03 ENCOUNTER — Other Ambulatory Visit: Payer: Self-pay

## 2021-02-03 ENCOUNTER — Ambulatory Visit: Payer: Medicaid Other | Admitting: Family

## 2021-02-03 VITALS — BP 119/68 | HR 78 | Temp 98.5°F | Resp 16 | Ht 66.0 in | Wt 215.2 lb

## 2021-02-03 DIAGNOSIS — E039 Hypothyroidism, unspecified: Secondary | ICD-10-CM | POA: Diagnosis not present

## 2021-02-03 DIAGNOSIS — R1013 Epigastric pain: Secondary | ICD-10-CM

## 2021-02-03 DIAGNOSIS — D509 Iron deficiency anemia, unspecified: Secondary | ICD-10-CM

## 2021-02-03 LAB — CBC WITH DIFFERENTIAL/PLATELET
Basophils Absolute: 0 10*3/uL (ref 0.0–0.1)
Basophils Relative: 0.5 % (ref 0.0–3.0)
Eosinophils Absolute: 0.2 10*3/uL (ref 0.0–0.7)
Eosinophils Relative: 2.9 % (ref 0.0–5.0)
HCT: 35.8 % — ABNORMAL LOW (ref 36.0–46.0)
Hemoglobin: 11.7 g/dL — ABNORMAL LOW (ref 12.0–15.0)
Lymphocytes Relative: 31.1 % (ref 12.0–46.0)
Lymphs Abs: 2 10*3/uL (ref 0.7–4.0)
MCHC: 32.6 g/dL (ref 30.0–36.0)
MCV: 81.5 fl (ref 78.0–100.0)
Monocytes Absolute: 0.8 10*3/uL (ref 0.1–1.0)
Monocytes Relative: 12.4 % — ABNORMAL HIGH (ref 3.0–12.0)
Neutro Abs: 3.4 10*3/uL (ref 1.4–7.7)
Neutrophils Relative %: 53.1 % (ref 43.0–77.0)
Platelets: 270 10*3/uL (ref 150.0–400.0)
RBC: 4.4 Mil/uL (ref 3.87–5.11)
RDW: 15.8 % — ABNORMAL HIGH (ref 11.5–15.5)
WBC: 6.4 10*3/uL (ref 4.0–10.5)

## 2021-02-03 LAB — IRON: Iron: 111 ug/dL (ref 42–145)

## 2021-02-03 LAB — FERRITIN: Ferritin: 7.6 ng/mL — ABNORMAL LOW (ref 10.0–291.0)

## 2021-02-03 LAB — TSH: TSH: 0.87 u[IU]/mL (ref 0.35–4.50)

## 2021-02-03 NOTE — Progress Notes (Signed)
Established Patient Office Visit  Subjective:  Patient ID: Jessica Villa, female    DOB: 04/16/1981  Age: 40 y.o. MRN: [id number]  CC:  Chief Complaint  Patient presents with  . Abdominal Pain    Pain not better, appointment with GI 02-13-21   . Headache    Complains of daily headaches with "eye pain"    HPI Jessica Villa presents for complaint of abdominal pain.  I last saw the patient back on December 23, 2020.  At that time she had chief complaint of epigastric abdominal pain and nausea.  She is status post cholecystectomy which was performed in December 2021.  Reports that her abdominal pain is intermittent.  Initially the carafate and protonix seemed to help but no longer helping. + nausea, no vomiting. BM's are frequent and soft.  No further black stools.    She is taking an iron supplement.    Past Medical History:  Diagnosis Date  . Anemia    during pregnancy  . Depression   . Eczema   . GERD (gastroesophageal reflux disease)   . History of ovarian cyst   . Hx of varicella   . Hypothyroidism   . PONV (postoperative nausea and vomiting)   . Thyroid disease    hypothyroid    Past Surgical History:  Procedure Laterality Date  . CESAREAN SECTION N/A 02/18/2016   Procedure: CESAREAN SECTION;  Surgeon: Harold Hedge, MD;  Location: [facility];  Service: Obstetrics;  Laterality: N/A;  . CESAREAN SECTION WITH BILATERAL TUBAL LIGATION N/A 01/10/2019   Procedure: CESAREAN SECTION WITH BILATERAL TUBAL LIGATION;  Surgeon: Harold Hedge, MD;  Location: [facility] LD [facility]S;  Service: Obstetrics;  Laterality: N/A;  Repeat edc 4/29 NKDA Tracey RNFA  . CHOLECYSTECTOMY N/A 08/22/2020   Procedure: LAPAROSCOPIC CHOLECYSTECTOMY;  Surgeon: Quentin Ore, MD;  Location: [facility];  Service: General;  Laterality: N/A;  . NO PAST SURGERIES    . WISDOM TOOTH EXTRACTION     in high school per patient    Family History  Problem Relation Age of Onset  . Depression Mother   . Migraines  Mother   . Dementia Father   . Diabetes Father   . Depression Father   . Hypothyroidism Father   . Alcohol abuse Maternal Grandmother   . Alcohol abuse Maternal Grandfather   . Hypothyroidism Paternal Grandmother   . Heart disease Paternal Grandfather   . Diabetes Paternal Grandfather   . Skin cancer Paternal Grandfather   . Depression Sister   . ADD / ADHD Sister   . Irritable bowel syndrome Sister   . Panic disorder Brother   . Hypothyroidism Brother     Social History   Socioeconomic History  . Marital status: Married    Spouse name: Not on file  . Number of children: Not on file  . Years of education: Not on file  . Highest education level: Not on file  Occupational History  . Not on file  Tobacco Use  . Smoking status: Never Smoker  . Smokeless tobacco: Never Used  Vaping Use  . Vaping Use: Never used  Substance and Sexual Activity  . Alcohol use: Yes    Comment: 1 cocktail or wine per month none since pregnancy  . Drug use: No  . Sexual activity: Yes    Partners: Male  Other Topics Concern  . Not on file  Social History Narrative   2 step children ( 6year and 42 year old) lives with pt  and her husband.    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   Has masters degree in education leadership   Has worked as a Dance movement psychotherapist, crocheting, no pets      Social Determinants of Corporate investment banker Strain: Not on [employer] Insecurity: Not on file  Transportation Needs: Not on file  Physical Activity: Not on file  Stress: Not on file  Social Connections: Not on file  Intimate Partner Violence: Not on file    Outpatient Medications Prior to Visit  Medication Sig Dispense Refill  . citalopram (CELEXA) 40 MG tablet Take 1 tablet (40 mg total) by mouth daily. (Patient taking differently: Take 40 mg by mouth at bedtime.) 90 tablet 1  . Ferrous Sulfate (SLOW FE) 142 (45 Fe) MG TBCR Take 1 tablet by mouth daily. 30  tablet   . levothyroxine (SYNTHROID) 175 MCG tablet Take 1 tablet (175 mcg total) by mouth daily. 90 tablet 1  . Multiple Vitamins-Minerals (MULTIVITAMIN WITH MINERALS) tablet Take 1 tablet by mouth daily.    . pantoprazole (PROTONIX) 40 MG tablet Take 1 tablet by mouth 2 (two) times daily.    . sucralfate (CARAFATE) 1 GM/10ML suspension Take 10 mLs (1 g total) by mouth 4 (four) times daily -  with meals and at bedtime. 840 mL 1  . triamcinolone ointment (KENALOG) 0.1 % Apply 1 application topically 2 (two) times daily as needed (eczema).      No facility-administered medications prior to visit.    No Known Allergies  ROS Review of Systems    Objective:    Physical Exam Constitutional:      Appearance: She is well-developed.  Cardiovascular:     Rate and Rhythm: Normal rate and regular rhythm.     Heart sounds: Normal heart sounds. No murmur heard.   Pulmonary:     Effort: Pulmonary effort is normal. No respiratory distress.     Breath sounds: Normal breath sounds. No wheezing.  Abdominal:     General: Bowel sounds are normal.     Palpations: Abdomen is soft.     Tenderness: There is no abdominal tenderness.  Psychiatric:        Behavior: Behavior normal.        Thought Content: Thought content normal.        Judgment: Judgment normal.     BP 119/68 (BP Location: Right Arm, Patient Position: Sitting, Cuff Size: Small)   Pulse 78   Temp 98.5 F (36.9 C) (Oral)   Resp 16   Ht 5\' 6"  (1.676 m)   Wt 215 lb 3.2 oz (97.6 kg)   SpO2 (!) 74%   BMI 34.73 kg/m  Wt Readings from Last 3 Encounters:  02/03/21 215 lb 3.2 oz (97.6 kg)  12/23/20 212 lb (96.2 kg)  08/22/20 211 lb 4.8 oz (95.8 kg)     Health Maintenance Due  Topic Date Due  . COVID-19 Vaccine (3 - Booster for Pfizer series) 07/22/2020    There are no preventive care reminders to display for this patient.  Lab Results  Component Value Date   TSH 6.95 (H) 12/23/2020   Lab Results  Component Value Date    WBC 6.7 12/23/2020   HGB 10.9 (L) 12/23/2020   HCT 33.0 (L) 12/23/2020   MCV 83.5 12/23/2020   PLT 303.0 12/23/2020   Lab Results  Component Value Date   NA  138 07/10/2020   K 4.3 07/10/2020   CO2 23 07/10/2020   GLUCOSE 87 07/10/2020   BUN 15 07/10/2020   CREATININE 0.84 07/10/2020   BILITOT 0.3 07/10/2020   ALKPHOS 64 07/10/2020   AST 20 07/10/2020   ALT 15 07/10/2020   PROT 7.2 07/10/2020   ALBUMIN 4.4 07/10/2020   CALCIUM 9.6 07/10/2020   GFR 90.95 05/01/2019   Lab Results  Component Value Date   CHOL 153 05/30/2020   Lab Results  Component Value Date   HDL 53 05/30/2020   Lab Results  Component Value Date   LDLCALC 91 05/30/2020   Lab Results  Component Value Date   TRIG 41 05/30/2020   Lab Results  Component Value Date   CHOLHDL 2.9 05/30/2020   No results found for: HGBA1C    Assessment & Plan:   Problem List Items Addressed This Visit   None     No orders of the defined types were placed in this encounter.   Follow-up: No follow-ups on file.    Lemont Fillers, NP

## 2021-02-03 NOTE — Assessment & Plan Note (Signed)
Continue protonix 40mg  once daily and carafate QID. Obtain CT abd/pelvis (oral contrast only due to shortage of IV contrast). Check urine HCG prior to CT.  Keep upcoming appointment with GI.

## 2021-02-03 NOTE — Assessment & Plan Note (Signed)
Obtain CBC, iron studies.  Continue iron 142mg  daily.

## 2021-02-03 NOTE — Assessment & Plan Note (Signed)
Obtain follow up tsh.  

## 2021-02-03 NOTE — Patient Instructions (Signed)
Please complete lab work prior to leaving.  You should be contacted about scheduling your appointment for CT.

## 2021-02-04 LAB — PREGNANCY, URINE: Preg Test, Ur: NEGATIVE

## 2021-02-11 ENCOUNTER — Encounter: Payer: Self-pay | Admitting: Family

## 2021-02-11 NOTE — Progress Notes (Signed)
02/11/2021 Jessica Villa [id number] Feb 05, 1981   CHIEF COMPLAINT: Abdominal pain, diarrhea  HISTORY OF PRESENT ILLNESS: Jessica Villa is a 40 year old female with a history of depression, hypothyroidism, anemia and GERD.  S/P cholecystectomy 08/2020, C-section x 2and tubal ligation.  She was referred to our office by Daryel Gerald, NP for further evaluation regarding upper abdominal pain and anemia.  She reported having epigastric pain prior to her cholecystectomy which was 08/2020.  She continues to have epigastric pain following her cholecystectomy.  She described having central epigastric tenderness which is constant regardless if she ate or not and was sometimes worse with change of position such as twisting from side to side.  No dysphagia or heartburn.  A few weeks ago, she had an episode of significant epigastric pain when she felt as if she had a fever with chills and nausea.  No vomiting.  She was seen by Sandford Craze and she was prescribed Protonix 40 mg daily and Carafate 1 g 4 times daily.  She took Pepto-Bismol for 4 days last wee and passed 1 solid black stool.  She stopped taking Pepto-Bismol without recurrence of any black stool. She reports having 3-4 brown loose mud-like stools daily which have progressively worsened for the past 2 months.  No recent antibiotic use.  No rectal bleeding.  No lower abdominal pain.  No NSAID use.  He breast-feeds her 25-year-old toddler once daily.  CTAP without contrast was done on 02/12/2021 as ordered by her PCP, esults are pending.  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  She also noted having anemia during her pregnancy 12/2018 for which she required iron infusion x2 and her anemia resolved.  However, laboratory studies done 12/23/2020 showed a Hemoglobin 10.9 (Hg 13.1 on 08/18/2020), hematocrit 33.  Iron 35.  Ferritin 4.5.  She was prescribed Ferrous Sulfate 325 mg 1 p.o. daily.   Repeat laboratory studies 02/03/2021 showed a hemoglobin level 11.7, iron 111 and ferritin 7.6.  No history of menorrhagia.  No family history of celiac disease or IBD.  Her stress level is significantly elevated, she and her husband are having difficulties obtaining custody of his two children from another marriage.    CBC Latest Ref Rng & Units 02/03/2021 12/23/2020 08/18/2020  WBC 4.0 - 10.5 K/uL 6.4 6.7 7.4  Hemoglobin 12.0 - 15.0 g/dL 11.7(L) 10.9(L) 13.1  Hematocrit 36.0 - 46.0 % 35.8(L) 33.0(L) 41.3  Platelets 150.0 - 400.0 K/uL 270.0 303.0 266  Iron 111 and Ferritin 7.6 on 02/03/2021. Iron 35 and Ferritin 4.5 on 12/23/2020   CMP Latest Ref Rng & Units 07/10/2020 05/30/2020 05/01/2019  Glucose 65 - 99 mg/dL 87 94 91  BUN 6 - 24 mg/dL 15 12 14   Creatinine 0.57 - 1.00 mg/dL 4.16 6.06  Sodium 134 - 144 mmol/L 138 138 140  Potassium 3.5 - 5.2 mmol/L 4.3 4.6 4.3  Chloride 96 - 106 mmol/L 102 103 105  CO2 20 - 29 mmol/L 23 22 27   Calcium 8.7 - 10.2 mg/dL 9.6 9.1 9.5  Total Protein 6.0 - 8.5 g/dL 7.2 6.7 6.6  Total Bilirubin 0.0 - 1.2 mg/dL 0.3 0.2 0.4  Alkaline Phos 44 - 121 IU/L 64 72 70  AST 0 - 40 IU/L 20 24 22   ALT 0 - 32 IU/L 15 18 19     Past Medical History:  Diagnosis Date  . Anemia    during pregnancy  .  Depression   . Eczema   . GERD (gastroesophageal reflux disease)   . History of ovarian cyst   . Hx of varicella   . Hypothyroidism   . PONV (postoperative nausea and vomiting)   . Thyroid disease    hypothyroid   Past Surgical History:  Procedure Laterality Date  . CESAREAN SECTION N/A 02/18/2016   Procedure: CESAREAN SECTION;  Surgeon: Harold Hedge, MD;  Location: [facility] BIRTHING SUITES;  Service: Obstetrics;  Laterality: N/A;  . CESAREAN SECTION WITH BILATERAL TUBAL LIGATION N/A 01/10/2019   Procedure: CESAREAN SECTION WITH BILATERAL TUBAL LIGATION;  Surgeon: Harold Hedge, MD;  Location: [facility] LD ORS;  Service: Obstetrics;  Laterality: N/A;  Repeat edc  4/29 NKDA Tracey RNFA  . CHOLECYSTECTOMY N/A 08/22/2020   Procedure: LAPAROSCOPIC CHOLECYSTECTOMY;  Surgeon: Quentin Ore, MD;  Location: [facility] OR;  Service: General;  Laterality: N/A;  . NO PAST SURGERIES    . WISDOM TOOTH EXTRACTION     in high school per patient    Social History: She is married.  She has 4 children at home (2 biological children and 2 stepchildren).  She is a stay-at-home mom.  She is non-smoker.  She drinks 4 margaritas yearly.  No drug use.  Family History: family history includes ADD / ADHD in her sister; Alcohol abuse in her maternal grandfather and maternal grandmother; Dementia in her father; Depression in her father, mother, and sister; Diabetes in her father and paternal grandfather; Heart disease in her paternal grandfather; Hypothyroidism in her brother, father, and paternal grandmother; Irritable bowel syndrome in her sister; Migraines in her mother; Panic disorder in her brother; Skin cancer in her paternal grandfather.  No known family history of esophageal, gastric or colon cancer. No Known Allergies    Outpatient Encounter Medications as of 02/13/2021  Medication Sig  . citalopram (CELEXA) 40 MG tablet Take 1 tablet (40 mg total) by mouth daily. (Patient taking differently: Take 40 mg by mouth at bedtime.)  . Ferrous Sulfate (SLOW FE) 142 (45 Fe) MG TBCR Take 1 tablet by mouth daily.  Marland Kitchen levothyroxine (SYNTHROID) 175 MCG tablet Take 1 tablet (175 mcg total) by mouth daily.  . Multiple Vitamins-Minerals (MULTIVITAMIN WITH MINERALS) tablet Take 1 tablet by mouth daily.  . pantoprazole (PROTONIX) 40 MG tablet Take 1 tablet by mouth 2 (two) times daily.  . sucralfate (CARAFATE) 1 GM/10ML suspension Take 10 mLs (1 g total) by mouth 4 (four) times daily -  with meals and at bedtime.  . triamcinolone ointment (KENALOG) 0.1 % Apply 1 application topically 2 (two) times daily as needed (eczema).    No facility-administered encounter medications on file as of  02/13/2021.    REVIEW OF SYSTEMS:  Gen: Night sweats and fatiuge. No weight loss.  CV: Denies chest pain, palpitations or edema. Resp: Denies cough, shortness of breath of hemoptysis.  GI: See HPI. GU : Denies urinary burning, blood in urine, increased urinary frequency or incontinence. MS: + Back pain.  Derm: Denies rash, itchiness, skin lesions or unhealing ulcers. Psych: Denies depression, anxiety, memory loss, suicidal ideation and confusion. Heme: Denies bruising, bleeding. Neuro:  + Headaches.  Endo:  Denies any problems with DM, thyroid or adrenal function.   PHYSICAL EXAM: BP 100/70 (BP Location: Left Arm, Patient Position: Sitting, Cuff Size: Normal)   Pulse 60   Ht 5' 6.25" (1.683 m) Comment: height measured without shoes  Wt 213 lb 2 oz (96.7 kg)   LMP 01/23/2021   Breastfeeding Yes  BMI 34.14 kg/m    No chance of pregnancy, patient underwent a tubal ligation 12/2018  General: Well developed 40 year old female in no acute distress. Head: Normocephalic and atraumatic. Eyes:  Sclerae non-icteric, conjunctive pink. Ears: Normal auditory acuity. Mouth: Dentition intact. No ulcers or lesions.  Neck: Supple, no lymphadenopathy or thyromegaly.  Lungs: Clear bilaterally to auscultation without wheezes, crackles or rhonchi. Heart: Regular rate and rhythm. No murmur, rub or gallop appreciated.  Abdomen: Soft, non distended. Mild epigastric to above the umbilicus and RLQ tenderness No masses. No hepatosplenomegaly. Normoactive bowel sounds x 4 quadrants.  Rectal: Deferred.  Musculoskeletal: Symmetrical with no gross deformities. Skin: Warm and dry. No rash or lesions on visible extremities. Extremities: No edema. Neurological: Alert oriented x 4, no focal deficits.  Psychological:  Alert and cooperative. Normal mood and affect.  ASSESSMENT AND PLAN:  67.  40 year old female with epigastric pain an nausea.  No vomiting. -EGD benefits and risks discussed including risk  with sedation, risk of bleeding, perforation and infection  -Continue Pantoprazole 40 mg p.o. twice daily -Reduce Carafate 1 g p.o. twice daily not to be taken within 2 to 4 hours of any other medication -Ondansetron as needed -CMP, CRP. -Await CTAP 02/12/2021 results  2.  Diarrhea x64-month, multi-factorial: rule out infectious diarrhea vs post cholecystectomy diarrhea vs PPI side effect vs colitis vs high stress level -GI pathogen panel.  TTG, IgA, CRP -I discussed a trial of Cholestyramine if GI pathogen panel negative -I discussed scheduling a diagnostic colonoscopy in the setting of diarrhea x 2 months with IDA of unclear etiology, rule out IBD or defer until EGD completed. She preferred to schedule an EGD and colonoscopy at the same time. Await GI pathogen panel results (if she has infectious diarrhea, a colonoscopy may not be warranted).  -Diagnostic colonoscopy benefits and risks discussed including risk with sedation, risk of bleeding, perforation and infection  -Patient to contact her pediatrician for breast-feeding instructions they of EGD and colonoscopy (she breast-feeds her toddler once daily)  3.  IDA, unclear etiology -See plan in #1 and #2 -Follow-up with PCP for repeat CBC and iron levels in 4 weeks   CC:  Sandford Craze, NP

## 2021-02-12 ENCOUNTER — Ambulatory Visit (HOSPITAL_BASED_OUTPATIENT_CLINIC_OR_DEPARTMENT_OTHER)
Admission: RE | Admit: 2021-02-12 | Discharge: 2021-02-12 | Disposition: A | Discharge status: Home / Self Care | Payer: Medicaid Other | Source: Ambulatory Visit | Attending: Family | Admitting: Family

## 2021-02-12 ENCOUNTER — Other Ambulatory Visit: Payer: Self-pay

## 2021-02-12 DIAGNOSIS — Z9049 Acquired absence of other specified parts of digestive tract: Secondary | ICD-10-CM | POA: Diagnosis not present

## 2021-02-12 DIAGNOSIS — R1013 Epigastric pain: Secondary | ICD-10-CM | POA: Diagnosis not present

## 2021-02-12 MED ORDER — SLOW FE 142 (45 FE) MG PO TBCR: 1.0000 | EXTENDED_RELEASE_TABLET | Freq: Every day | ORAL | 3 refills | Status: DC

## 2021-02-13 ENCOUNTER — Encounter: Payer: Self-pay | Admitting: Nurse Practitioner

## 2021-02-13 ENCOUNTER — Ambulatory Visit (INDEPENDENT_AMBULATORY_CARE_PROVIDER_SITE_OTHER): Payer: Medicaid Other | Admitting: Nurse Practitioner

## 2021-02-13 ENCOUNTER — Other Ambulatory Visit (INDEPENDENT_AMBULATORY_CARE_PROVIDER_SITE_OTHER): Payer: Medicaid Other

## 2021-02-13 VITALS — BP 100/70 | HR 60 | Ht 66.25 in | Wt 213.1 lb

## 2021-02-13 DIAGNOSIS — R197 Diarrhea, unspecified: Secondary | ICD-10-CM | POA: Diagnosis not present

## 2021-02-13 DIAGNOSIS — R1013 Epigastric pain: Secondary | ICD-10-CM

## 2021-02-13 DIAGNOSIS — D509 Iron deficiency anemia, unspecified: Secondary | ICD-10-CM

## 2021-02-13 LAB — COMPREHENSIVE METABOLIC PANEL
ALT: 15 U/L (ref 0–35)
AST: 19 U/L (ref 0–37)
Albumin: 4.2 g/dL (ref 3.5–5.2)
Alkaline Phosphatase: 52 U/L (ref 39–117)
BUN: 16 mg/dL (ref 6–23)
CO2: 27 mEq/L (ref 19–32)
Calcium: 9.2 mg/dL (ref 8.4–10.5)
Chloride: 105 mEq/L (ref 96–112)
Creatinine, Ser: 0.82 mg/dL (ref 0.40–1.20)
GFR: 88.32 mL/min (ref >60.00)
Glucose, Bld: 91 mg/dL (ref 70–99)
Potassium: 4.3 mEq/L (ref 3.5–5.1)
Sodium: 138 mEq/L (ref 135–145)
Total Bilirubin: 0.6 mg/dL (ref 0.2–1.2)
Total Protein: 7 g/dL (ref 6.0–8.3)

## 2021-02-13 LAB — LIPASE: Lipase: 20 U/L (ref 11.0–59.0)

## 2021-02-13 LAB — C-REACTIVE PROTEIN: CRP: <1 mg/dL (ref 0.5–20.0)

## 2021-02-13 NOTE — Patient Instructions (Addendum)
If you are age 42 or younger, your body mass index should be between 19-25. Your Body mass index is 34.14 kg/m. If this is out of the aformentioned range listed, please consider follow up with your Primary Care Provider.   The [facility] providers would like to encourage you to use [facility] to communicate with providers for non-urgent requests or questions.  Due to long hold times on the telephone, sending your provider a message by [facility] may be faster and more efficient way to get a response. Please allow 48 business hours for a response.  Please remember that this is for non-urgent requests/questions.  RECOMMENDATIONS: Continue taking Pantoprazole 40 MG twice a day. Decrease Carafate to 1gram twice a day. Contact your Pediatrician for breastfeeding instructions the day of your procedures.  PROCEDURES: You have been scheduled for a colonoscopy. Please follow the written instructions given to you at your visit today. If you use inhalers (even only as needed), please bring them with you on the day of your procedure.  LABS:  Lab work has been ordered for you today. Our lab is located in the basement. Press "B" on the elevator. The lab is located at the first door on the left as you exit the elevator.  HEALTHCARE LAWS AND MY CHART RESULTS: Due to recent changes in healthcare laws, you may see the results of your imaging and laboratory studies on MyChart before your provider has had a chance to review them.   We understand that in some cases there may be results that are confusing or concerning to you. Not all laboratory results come back in the same time frame and the provider may be waiting for multiple results in order to interpret others.  Please give [place] 48 hours in order for your provider to thoroughly review all the results before contacting the office for clarification of your results.   It was great seeing you today! Thank you for entrusting me with your care and choosing [facility].  Arnaldo Natal, CRNP   Gastroesophageal Reflux Disease, Adult  Gastroesophageal reflux (GER) happens when acid from the stomach flows up into the tube that connects the mouth and the stomach (esophagus). Normally, food travels down the esophagus and stays in the stomach to be digested. With GER, food and stomach acid sometimes move back up into the esophagus. You may have a disease called gastroesophageal reflux disease (GERD) if the reflux:  Happens often.  Causes frequent or very bad symptoms.  Causes problems such as damage to the esophagus. When this happens, the esophagus becomes sore and swollen. Over time, GERD can make small holes (ulcers) in the lining of the esophagus. What are the causes? This condition is caused by a problem with the muscle between the esophagus and the stomach. When this muscle is weak or not normal, it does not close properly to keep food and acid from coming back up from the stomach. The muscle can be weak because of:  Tobacco use.  Pregnancy.  Having a certain type of hernia (hiatal hernia).  Alcohol use.  Certain foods and drinks, such as coffee, chocolate, onions, and peppermint. What increases the risk?  Being overweight.  Having a disease that affects your connective tissue.  Taking NSAIDs, such a ibuprofen. What are the signs or symptoms?  Heartburn.  Difficult or painful swallowing.  The feeling of having a lump in the throat.  A bitter taste in the mouth.  Bad breath.  Having a lot of  saliva.  Having an upset or bloated stomach.  Burping.  Chest pain. Different conditions can cause chest pain. Make sure you see your doctor if you have chest pain.  Shortness of breath or wheezing.  A long-term cough or a cough at night.  Wearing away of the surface of teeth (tooth enamel).  Weight loss. How is this treated?  Making changes to your diet.  Taking medicine.  Having surgery. Treatment will depend  on how bad your symptoms are. Follow these instructions at home: Eating and drinking  Follow a diet as told by your doctor. You may need to avoid foods and drinks such as: ? Coffee and tea, with or without caffeine. ? Drinks that contain alcohol. ? Energy drinks and sports drinks. ? Bubbly (carbonated) drinks or sodas. ? Chocolate and cocoa. ? Peppermint and mint flavorings. ? Garlic and onions. ? Horseradish. ? Spicy and acidic foods. These include peppers, chili powder, curry powder, vinegar, hot sauces, and BBQ sauce. ? Citrus fruit juices and citrus fruits, such as oranges, lemons, and limes. ? Tomato-based foods. These include red sauce, chili, salsa, and pizza with red sauce. ? Fried and fatty foods. These include donuts, french fries, potato chips, and high-fat dressings. ? High-fat meats. These include hot dogs, rib eye steak, sausage, ham, and bacon. ? High-fat dairy items, such as whole milk, butter, and cream cheese.  Eat small meals often. Avoid eating large meals.  Avoid drinking large amounts of liquid with your meals.  Avoid eating meals during the 2-3 hours before bedtime.  Avoid lying down right after you eat.  Do not exercise right after you eat.   Lifestyle  Do not smoke or use any products that contain nicotine or tobacco. If you need help quitting, ask your doctor.  Try to lower your stress. If you need help doing this, ask your doctor.  If you are overweight, lose an amount of weight that is healthy for you. Ask your doctor about a safe weight loss goal.   General instructions  Pay attention to any changes in your symptoms.  Take over-the-counter and prescription medicines only as told by your doctor.  Do not take aspirin, ibuprofen, or other NSAIDs unless your doctor says it is okay.  Wear loose clothes. Do not wear anything tight around your waist.  Raise (elevate) the head of your bed about 6 inches (15 cm). You may need to use a wedge to do  this.  Avoid bending over if this makes your symptoms worse.  Keep all follow-up visits. Contact a doctor if:  You have new symptoms.  You lose weight and you do not know why.  You have trouble swallowing or it hurts to swallow.  You have wheezing or a cough that keeps happening.  You have a hoarse voice.  Your symptoms do not get better with treatment. Get help right away if:  You have sudden pain in your arms, neck, jaw, teeth, or back.  You suddenly feel sweaty, dizzy, or light-headed.  You have chest pain or shortness of breath.  You vomit and the vomit is green, yellow, or black, or it looks like blood or coffee grounds.  You faint.  Your poop (stool) is red, bloody, or black.  You cannot swallow, drink, or eat. These symptoms may represent a serious problem that is an emergency. Do not wait to see if the symptoms will go away. Get medical help right away. Call your local emergency services (911 in the [place]  Do not drive yourself to the hospital. Summary  If a person has gastroesophageal reflux disease (GERD), food and stomach acid move back up into the esophagus and cause symptoms or problems such as damage to the esophagus.  Treatment will depend on how bad your symptoms are.  Follow a diet as told by your doctor.  Take all medicines only as told by your doctor. This information is not intended to replace advice given to you by your health care provider. Make sure you discuss any questions you have with your health care provider. Document Revised: 03/17/2020 Document Reviewed: 03/17/2020 Elsevier Patient Education  2021 [employer].

## 2021-02-17 LAB — IGA: Immunoglobulin A: 138 mg/dL (ref 47–310)

## 2021-02-17 LAB — TISSUE TRANSGLUTAMINASE ABS,IGG,IGA
(tTG) Ab, IgA: 26.4 U/mL — ABNORMAL HIGH
(tTG) Ab, IgG: 17.6 U/mL — ABNORMAL HIGH

## 2021-02-18 ENCOUNTER — Other Ambulatory Visit: Payer: Medicaid Other

## 2021-02-18 ENCOUNTER — Telehealth: Payer: Self-pay | Admitting: General Surgery

## 2021-02-18 DIAGNOSIS — R197 Diarrhea, unspecified: Secondary | ICD-10-CM | POA: Diagnosis not present

## 2021-02-18 DIAGNOSIS — R1013 Epigastric pain: Secondary | ICD-10-CM

## 2021-02-18 DIAGNOSIS — D509 Iron deficiency anemia, unspecified: Secondary | ICD-10-CM

## 2021-02-18 NOTE — Telephone Encounter (Signed)
-----   Message from Vito Cirigliano V, DO sent at 02/18/2021  8:14 AM EDT ----- I think that is a very reasonable approach and can likely get the EGD done sooner than 03/30/21.   Emonie Espericueta, please assist in rescheduling as an expedited EGD only. Thanks.   ----- Message ----- From: Kennedy-Smith, Colleen M, NP Sent: 02/17/2021   2:14 PM EDT To: Vito Cirigliano V, DO  I called Ms. Paul, I discussed her TTG IgG and TTG IGA levels were elevated, most likely has celiac disease. She is scheduled for an EGD/colonoscopy with Dr. Cirigliano 03/30/2021. I discussed schedule an EGD at an earlier date to confirm a diagnosis of celiac disease and if her diarrhea resolves she may not need a diagnostic colonoscopy. Of note, she has not yet completed the GI pathogen panel test due to the prolonged holiday weekend but intends to do in the next few days.  Dr. Cirigliano,  What are your thoughts about doing an EGD soon and if needed a colonoscopy at a later date? Patient is aware to remain on gluten in order to obtain accurate duodenal biopsies but I did not want her to wait until July for EGD.   

## 2021-02-18 NOTE — Telephone Encounter (Signed)
Spoke with the patient and we scheduled the EGD 03/02/2021 ta

## 2021-02-18 NOTE — Telephone Encounter (Signed)
-----   Message from [place] V, DO sent at 02/18/2021  8:14 AM EDT ----- I think that is a very reasonable approach and can likely get the EGD done sooner than 03/30/21.   French Ana, please assist in rescheduling as an expedited EGD only. Thanks.   ----- Message ----- From: Arnaldo Natal, NP Sent: 02/17/2021   2:14 PM EDT To: Shellia Cleverly, DO  I called Ms. Catalano, I discussed her TTG IgG and TTG IGA levels were elevated, most likely has celiac disease. She is scheduled for an EGD/colonoscopy with Dr. Barron Alvine 03/30/2021. I discussed schedule an EGD at an earlier date to confirm a diagnosis of celiac disease and if her diarrhea resolves she may not need a diagnostic colonoscopy. Of note, she has not yet completed the GI pathogen panel test due to the prolonged holiday weekend but intends to do in the next few days.  Dr. Barron Alvine,  What are your thoughts about doing an EGD soon and if needed a colonoscopy at a later date? Patient is aware to remain on gluten in order to obtain accurate duodenal biopsies but I did not want her to wait until July for EGD.

## 2021-02-20 NOTE — Progress Notes (Signed)
Agree with the assessment and plan as outlined by Colleen Kennedy-Smith, NP.   Liliauna Santoni, DO, FACG [facility]   

## 2021-02-21 LAB — GI PROFILE, STOOL, PCR

## 2021-03-02 ENCOUNTER — Ambulatory Visit (AMBULATORY_SURGERY_CENTER): Payer: Medicaid Other | Admitting: Gastroenterology

## 2021-03-02 ENCOUNTER — Other Ambulatory Visit: Payer: Self-pay

## 2021-03-02 ENCOUNTER — Encounter: Payer: Self-pay | Admitting: Gastroenterology

## 2021-03-02 VITALS — BP 91/61 | HR 59 | Temp 98.0°F | Resp 11 | Ht 66.25 in | Wt 213.0 lb

## 2021-03-02 DIAGNOSIS — K2950 Unspecified chronic gastritis without bleeding: Secondary | ICD-10-CM | POA: Diagnosis not present

## 2021-03-02 DIAGNOSIS — K2970 Gastritis, unspecified, without bleeding: Secondary | ICD-10-CM

## 2021-03-02 DIAGNOSIS — D509 Iron deficiency anemia, unspecified: Secondary | ICD-10-CM | POA: Diagnosis not present

## 2021-03-02 DIAGNOSIS — K219 Gastro-esophageal reflux disease without esophagitis: Secondary | ICD-10-CM

## 2021-03-02 DIAGNOSIS — K900 Celiac disease: Secondary | ICD-10-CM

## 2021-03-02 DIAGNOSIS — R197 Diarrhea, unspecified: Secondary | ICD-10-CM | POA: Diagnosis not present

## 2021-03-02 DIAGNOSIS — R1013 Epigastric pain: Secondary | ICD-10-CM | POA: Diagnosis not present

## 2021-03-02 DIAGNOSIS — R1010 Upper abdominal pain, unspecified: Secondary | ICD-10-CM

## 2021-03-02 DIAGNOSIS — K2980 Duodenitis without bleeding: Secondary | ICD-10-CM | POA: Diagnosis not present

## 2021-03-02 MED ORDER — DICYCLOMINE HCL 10 MG PO CAPS: 10.0000 mg | ORAL_CAPSULE | Freq: Four times a day (QID) | ORAL | 0 refills | Status: DC | PRN

## 2021-03-02 MED ORDER — SODIUM CHLORIDE 0.9 % IV SOLN: 500.0000 mL | Freq: Once | INTRAVENOUS | Status: DC

## 2021-03-02 NOTE — Op Note (Signed)
[facility] Patient Name: Jessica Villa Procedure Date: 03/02/2021 3:51 PM MRN: [id number] Endoscopist: Doristine Locks , MD Age: 40 Referring MD:  Date of Birth: 1981-09-12 Gender: Female Account #: [id number] Procedure:                Upper GI endoscopy Indications:              Upper abdominal pain, Iron deficiency anemia,                            Suspected esophageal reflux                           Work up to date notable for elevated tTG IgG/IgA.                            Otherwise normal GI PCR panel, inflammatory                            markers, CMP, and CT abdomen/pelvis. Medicines:                Monitored Anesthesia Care Procedure:                Pre-Anesthesia Assessment:                           - Prior to the procedure, a History and Physical                            was performed, and patient medications and                            allergies were reviewed. The patient's tolerance of                            previous anesthesia was also reviewed. The risks                            and benefits of the procedure and the sedation                            options and risks were discussed with the patient.                            All questions were answered, and informed consent                            was obtained. Prior Anticoagulants: The patient has                            taken no previous anticoagulant or antiplatelet                            agents. ASA Grade Assessment: II - A patient with  mild systemic disease. After reviewing the risks                            and benefits, the patient was deemed in                            satisfactory condition to undergo the procedure.                           After obtaining informed consent, the endoscope was                            passed under direct vision. Throughout the                            procedure, the patient's blood pressure, pulse, and                             oxygen saturations were monitored continuously. The                            Endoscope was introduced through the mouth, and                            advanced to the third part of duodenum. The upper                            GI endoscopy was accomplished without difficulty.                            The patient tolerated the procedure well. Scope In: Scope Out: Findings:                 The examined esophagus was normal.                           The Z-line was regular and was found 39 cm from the                            incisors.                           The gastroesophageal flap valve was visualized                            endoscopically and classified as Hill Grade II                            (fold present, opens with respiration).                           The entire examined stomach was normal. Biopsies                            were taken with a cold forceps for  Helicobacter                            pylori testing. Estimated blood loss was minimal.                           Areas of mucosal flattening and subtle scalloping                            were found in the duodenal bulb, in the second                            portion of the duodenum and in the third portion of                            the duodenum. Biopsies for histology were taken                            with a cold forceps for evaluation of celiac                            disease. Estimated blood loss was minimal. Complications:            No immediate complications. Estimated Blood Loss:     Estimated blood loss was minimal. Impression:               - Normal esophagus.                           - Z-line regular, 39 cm from the incisors.                           - Gastroesophageal flap valve classified as Hill                            Grade II (fold present, opens with respiration).                           - Normal stomach. Biopsied.                           -  Flattened mucosa and perhaps some areas of subtle                            scalloping was found in the duodenum, suspicious                            for celiac disease. Biopsied. Recommendation:           - Patient has a contact number available for                            emergencies. The signs and symptoms of potential  delayed complications were discussed with the                            patient. Return to normal activities tomorrow.                            Written discharge instructions were provided to the                            patient.                           - Reasonable to initiate gluten free diet starting                            today. If biopsies confirm suspected diagnosis,                            will place referral to Nutrition for assistance in                            implementing GFD.                           - Continue present medications.                           - Await pathology results.                           - Return to GI clinic at appointment to be                            scheduled. Doristine LocksVito Santos Sollenberger, MD 03/02/2021 4:24:23 PM

## 2021-03-02 NOTE — Patient Instructions (Signed)
YOU HAD AN ENDOSCOPIC PROCEDURE TODAY AT THE [facility]:   Refer to the procedure report that was given to you for any specific questions about what was found during the examination.  If the procedure report does not answer your questions, please call your gastroenterologist to clarify.  If you requested that your care partner not be given the details of your procedure findings, then the procedure report has been included in a sealed envelope for you to review at your convenience later.  YOU SHOULD EXPECT: Some feelings of bloating in the abdomen. Passage of more gas than usual.  Walking can help get rid of the air that was put into your GI tract during the procedure and reduce the bloating. If you had a lower endoscopy (such as a colonoscopy or flexible sigmoidoscopy) you may notice spotting of blood in your stool or on the toilet paper. If you underwent a bowel prep for your procedure, you may not have a normal bowel movement for a few days.  Please Note:  You might notice some irritation and congestion in your nose or some drainage.  This is from the oxygen used during your procedure.  There is no need for concern and it should clear up in a day or so.  SYMPTOMS TO REPORT IMMEDIATELY:   Following lower endoscopy (colonoscopy or flexible sigmoidoscopy):  Excessive amounts of blood in the stool  Significant tenderness or worsening of abdominal pains  Swelling of the abdomen that is new, acute  Fever of 100F or higher   Following upper endoscopy (EGD)  Vomiting of blood or coffee ground material  New chest pain or pain under the shoulder blades  Painful or persistently difficult swallowing  New shortness of breath  Fever of 100F or higher  Black, tarry-looking stools  For urgent or emergent issues, a gastroenterologist can be reached at any hour by calling [phone]. Do not use MyChart messaging for urgent concerns.    DIET:  We do recommend a small meal at first, but  then you may proceed to your regular diet.  Drink plenty of fluids but you should avoid alcoholic beverages for 24 hours.  ACTIVITY:  You should plan to take it easy for the rest of today and you should NOT DRIVE or use heavy machinery until tomorrow (because of the sedation medicines used during the test).    FOLLOW UP: Our staff will call the number listed on your records 48-72 hours following your procedure to check on you and address any questions or concerns that you may have regarding the information given to you following your procedure. If we do not reach you, we will leave a message.  We will attempt to reach you two times.  During this call, we will ask if you have developed any symptoms of COVID 19. If you develop any symptoms (ie: fever, flu-like symptoms, shortness of breath, cough etc.) before then, please call [phone].  If you test positive for Covid 19 in the 2 weeks post procedure, please call and report this information to us.    If any biopsies were taken you will be contacted by phone or by letter within the next 1-3 weeks.  Please call us at [phone] if you have not heard about the biopsies in 3 weeks.    SIGNATURES/CONFIDENTIALITY: You and/or your care partner have signed paperwork which will be entered into your electronic medical record.  These signatures attest to the fact that that the information above on   your After Visit Summary has been reviewed and is understood.  Full responsibility of the confidentiality of this discharge information lies with you and/or your care-partner. 

## 2021-03-02 NOTE — Progress Notes (Signed)
Report to PACU, RN, vss, BBS= Clear.  

## 2021-03-02 NOTE — Progress Notes (Signed)
Called to room to assist during endoscopic procedure.  Patient ID and intended procedure confirmed with present staff. Received instructions for my participation in the procedure from the performing physician.  

## 2021-03-04 ENCOUNTER — Telehealth: Payer: Self-pay | Admitting: *Deleted

## 2021-03-04 ENCOUNTER — Telehealth: Payer: Self-pay

## 2021-03-04 NOTE — Telephone Encounter (Signed)
  Follow up Call-  Call back number 03/02/2021  Post procedure Call Back phone  # [phone]  Permission to leave phone message Yes  Some recent data might be hidden     Patient questions:  Do you have a fever, pain , or abdominal swelling? No. Pain Score  0 *  Have you tolerated food without any problems? Yes.    Have you been able to return to your normal activities? Yes.    Do you have any questions about your discharge instructions: Diet   No. Medications  No. Follow up visit  No.  Do you have questions or concerns about your Care? No.  Actions: * If pain score is 4 or above: No action needed, pain <4.  Have you developed a fever since your procedure? no  2.   Have you had an respiratory symptoms (SOB or cough) since your procedure? no  3.   Have you tested positive for COVID 19 since your procedure no  4.   Have you had any family members/close contacts diagnosed with the COVID 19 since your procedure?  no   If yes to any of these questions please route to Laverna Peace, RN and Karlton Lemon, RN

## 2021-03-04 NOTE — Telephone Encounter (Signed)
Left message on follow up call. 

## 2021-03-06 DIAGNOSIS — R42 Dizziness and giddiness: Secondary | ICD-10-CM | POA: Diagnosis not present

## 2021-03-06 DIAGNOSIS — E039 Hypothyroidism, unspecified: Secondary | ICD-10-CM | POA: Diagnosis not present

## 2021-03-06 DIAGNOSIS — S0083XA Contusion of other part of head, initial encounter: Secondary | ICD-10-CM | POA: Diagnosis not present

## 2021-03-06 DIAGNOSIS — R059 Cough, unspecified: Secondary | ICD-10-CM | POA: Diagnosis not present

## 2021-03-06 DIAGNOSIS — S299XXA Unspecified injury of thorax, initial encounter: Secondary | ICD-10-CM | POA: Diagnosis not present

## 2021-03-06 DIAGNOSIS — R55 Syncope and collapse: Secondary | ICD-10-CM | POA: Diagnosis not present

## 2021-03-06 DIAGNOSIS — I10 Essential (primary) hypertension: Secondary | ICD-10-CM | POA: Diagnosis not present

## 2021-03-06 DIAGNOSIS — U071 COVID-19: Secondary | ICD-10-CM | POA: Diagnosis not present

## 2021-03-06 DIAGNOSIS — G8911 Acute pain due to trauma: Secondary | ICD-10-CM | POA: Diagnosis not present

## 2021-03-06 DIAGNOSIS — S199XXA Unspecified injury of neck, initial encounter: Secondary | ICD-10-CM | POA: Diagnosis not present

## 2021-03-06 DIAGNOSIS — S0990XA Unspecified injury of head, initial encounter: Secondary | ICD-10-CM | POA: Diagnosis not present

## 2021-03-06 DIAGNOSIS — R110 Nausea: Secondary | ICD-10-CM | POA: Diagnosis not present

## 2021-03-09 ENCOUNTER — Telehealth: Payer: Self-pay

## 2021-03-09 NOTE — Telephone Encounter (Signed)
Transition Care Management Unsuccessful Follow-up Telephone Call  Date of discharge and from where:  03/06/2021 from [facility]  Attempts:  1st Attempt  Reason for unsuccessful TCM follow-up call:  Unable to leave message

## 2021-03-10 NOTE — Telephone Encounter (Signed)
Transition Care Management Unsuccessful Follow-up Telephone Call  Date of discharge and from where:  03/06/2021 from [facility]  Attempts:  2nd Attempt  Reason for unsuccessful TCM follow-up call:  Left voice message

## 2021-03-11 NOTE — Telephone Encounter (Signed)
Transition Care Management Unsuccessful Follow-up Telephone Call  Date of discharge and from where:  03/06/2021 from [facility]  Attempts:  3rd Attempt  Reason for unsuccessful TCM follow-up call:  Unable to reach patient

## 2021-03-17 ENCOUNTER — Telehealth: Payer: Self-pay | Admitting: General Surgery

## 2021-03-17 DIAGNOSIS — K900 Celiac disease: Secondary | ICD-10-CM

## 2021-03-17 NOTE — Telephone Encounter (Signed)
Left a detailed message on the patients voicemail explaining that the biopsies revealed celiac and we are placing a referral to nutrition and would like her to call and schedule a follow up here in the GI clinic.

## 2021-03-17 NOTE — Telephone Encounter (Signed)
-----   Message from Bovey V, DO sent at 03/16/2021  1:44 PM EDT ----- The biopsies taken from the stomach demonstrate mild gastritis (inflammation), but no evidence of helical back to pylori infection.  The biopsies taken from the duodenum were consistent with Celiac Disease.  I recommend referral to the Nutrition Clinic for assistance in implementing a gluten-free diet along with follow-up in the GI clinic.

## 2021-03-19 NOTE — Telephone Encounter (Signed)
Patient is aware to follow up in 3 months and a recall was placed in chart and message sent to tracy for 8-10 and her follow and procedure scheduled for July was cancelled

## 2021-03-20 ENCOUNTER — Encounter: Payer: Self-pay | Admitting: Family

## 2021-03-20 ENCOUNTER — Ambulatory Visit: Payer: Medicaid Other | Admitting: Family

## 2021-03-27 ENCOUNTER — Ambulatory Visit: Payer: Medicaid Other | Admitting: Family

## 2021-03-30 ENCOUNTER — Encounter: Payer: Medicaid Other | Admitting: Gastroenterology

## 2021-04-03 ENCOUNTER — Encounter: Payer: Self-pay | Admitting: Family

## 2021-04-03 ENCOUNTER — Ambulatory Visit: Payer: Medicaid Other | Admitting: Family

## 2021-04-03 ENCOUNTER — Other Ambulatory Visit: Payer: Self-pay

## 2021-04-03 VITALS — BP 110/80 | HR 75 | Temp 98.7°F | Resp 18 | Ht 66.25 in | Wt 211.6 lb

## 2021-04-03 DIAGNOSIS — K900 Celiac disease: Secondary | ICD-10-CM | POA: Diagnosis not present

## 2021-04-03 DIAGNOSIS — D509 Iron deficiency anemia, unspecified: Secondary | ICD-10-CM

## 2021-04-03 DIAGNOSIS — R55 Syncope and collapse: Secondary | ICD-10-CM | POA: Diagnosis not present

## 2021-04-03 DIAGNOSIS — F32A Depression, unspecified: Secondary | ICD-10-CM

## 2021-04-03 DIAGNOSIS — F419 Anxiety disorder, unspecified: Secondary | ICD-10-CM

## 2021-04-03 DIAGNOSIS — E039 Hypothyroidism, unspecified: Secondary | ICD-10-CM | POA: Diagnosis not present

## 2021-04-03 MED ORDER — SLOW FE 142 (45 FE) MG PO TBCR: 1.0000 | EXTENDED_RELEASE_TABLET | ORAL | 3 refills | Status: DC

## 2021-04-03 NOTE — Assessment & Plan Note (Signed)
This is a new diagnosis for her. She is feeling better on a gluten free diet.  Pt ed material given on gluten free diet.

## 2021-04-03 NOTE — Progress Notes (Signed)
Subjective:   By signing my name below, I, [name], attest that this documentation has been prepared under the direction and in the presence of Sandford Craze NP. 04/03/2021    Patient ID: Jessica Villa, female    DOB: 25-Jul-1981, 40 y.o.   MRN: [id number]  Chief Complaint  Patient presents with   Anemia    HPI Patient is in today for a office visit.  During last visit she complained of abdominal pain. Her CT scan was normal. She then seen a gastroenterologist and found she has celiac disease. Since changing her diet her abdominal pain greatly improved. She continues having occasional nausea.  Covid-19- She reports testing positive on 03/06/2021. She reports that while traveling out of town she had an episode of syncope and went to the ER. They found that her CT scan were normal and that her heart was not the cause. She tested Covid-19 positive and determined that was the cause for her syncope. Hemoglobin- Her hemoglobin improved during last visit. She continues taking iron supplements and is willing to start taking them every other day.  GERD- She no longer takes Protonix to manage her issues. Hypothyroid- Her has improved during that visit. Breast feeding- She stopped breast feeding.  Depression/Anxiety- Her mood is good. She thinks her previous issues were influenced by her celiac disease.   Health Maintenance Due  Topic Date Due   COVID-19 Vaccine (3 - Booster for Pfizer series) 07/22/2020    Past Medical History:  Diagnosis Date   Anemia    during pregnancy   Anxiety    Depression    Eczema    Gallstones    GERD (gastroesophageal reflux disease)    History of ovarian cyst    Hx of varicella    Hypothyroidism    PONV (postoperative nausea and vomiting)     Past Surgical History:  Procedure Laterality Date   CESAREAN SECTION N/A 02/18/2016   Procedure: CESAREAN SECTION;  Surgeon: Harold Hedge, MD;  Location: [facility];  Service: Obstetrics;   Laterality: N/A;   CESAREAN SECTION WITH BILATERAL TUBAL LIGATION N/A 01/10/2019   Procedure: CESAREAN SECTION WITH BILATERAL TUBAL LIGATION;  Surgeon: Harold Hedge, MD;  Location: [facility] LD ORS;  Service: Obstetrics;  Laterality: N/A;  Repeat edc 4/29 NKDA Tracey RNFA   CHOLECYSTECTOMY N/A 08/22/2020   Procedure: LAPAROSCOPIC CHOLECYSTECTOMY;  Surgeon: Quentin Ore, MD;  Location: [facility] OR;  Service: General;  Laterality: N/A;   WISDOM TOOTH EXTRACTION     in high school per patient    Family History  Problem Relation Age of Onset   Depression Mother    Migraines Mother    Hypothyroidism Mother    Dementia Father    Diabetes Father    Depression Father    Hypothyroidism Father    Alcohol abuse Maternal Grandmother    Alcohol abuse Maternal Grandfather    Hypothyroidism Paternal Grandmother    Heart disease Paternal Grandfather    Diabetes Paternal Grandfather    Skin cancer Paternal Grandfather    Depression Sister    ADD / ADHD Sister    Irritable bowel syndrome Sister    Anxiety disorder Sister    Panic disorder Brother    Hypothyroidism Brother    Anxiety disorder Brother     Social History   Socioeconomic History   Marital status: Married    Spouse name: Not on file   Number of children: 2   Years of education: Not on file  Highest education level: Not on file  Occupational History   Occupation: stay at home mom  Tobacco Use   Smoking status: Never   Smokeless tobacco: Never  Vaping Use   Vaping Use: Never used  Substance and Sexual Activity   Alcohol use: Yes    Comment: 1 cocktail or wine per month none since pregnancy   Drug use: No   Sexual activity: Yes    Partners: Male  Other Topics Concern   Not on file  Social History Narrative   2 step children ( 6year and 42 year old) lives with pt and her husband.    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   Has masters degree in education leadership   Has worked as a Soil scientist, crocheting, no pets      Social Determinants of Corporate investment banker Strain: Not on Ship broker Insecurity: Not on file  Transportation Needs: Not on file  Physical Activity: Not on file  Stress: Not on file  Social Connections: Not on file  Intimate Partner Violence: Not on file    Outpatient Medications Prior to Visit  Medication Sig Dispense Refill   citalopram (CELEXA) 40 MG tablet Take 1 tablet (40 mg total) by mouth daily. (Patient taking differently: Take 40 mg by mouth at bedtime.) 90 tablet 1   dicyclomine (BENTYL) 10 MG capsule Take 1 capsule (10 mg total) by mouth every 6 (six) hours as needed for spasms. 90 capsule 0   levothyroxine (SYNTHROID) 175 MCG tablet Take 1 tablet (175 mcg total) by mouth daily. 90 tablet 1   Multiple Vitamins-Minerals (MULTIVITAMIN WITH MINERALS) tablet Take 1 tablet by mouth daily.     triamcinolone ointment (KENALOG) 0.1 % Apply 1 application topically 2 (two) times daily as needed (eczema).      Ferrous Sulfate (SLOW FE) 142 (45 Fe) MG TBCR Take 1 tablet by mouth daily. 90 tablet 3   pantoprazole (PROTONIX) 40 MG tablet Take 1 tablet by mouth 2 (two) times daily. (Patient not taking: Reported on 04/03/2021)     sucralfate (CARAFATE) 1 GM/10ML suspension Take 10 mLs (1 g total) by mouth 4 (four) times daily -  with meals and at bedtime. (Patient not taking: Reported on 04/03/2021) 840 mL 1   No facility-administered medications prior to visit.    No Known Allergies  ROS See HPI    Objective:    Physical Exam Constitutional:      General: She is not in acute distress.    Appearance: Normal appearance. She is not ill-appearing.  HENT:     Head: Normocephalic and atraumatic.     Right Ear: External ear normal.     Left Ear: External ear normal.  Eyes:     Extraocular Movements: Extraocular movements intact.     Pupils: Pupils are equal, round, and reactive to light.  Cardiovascular:     Rate and Rhythm:  Normal rate and regular rhythm.     Pulses: Normal pulses.     Heart sounds: Normal heart sounds. No murmur heard.   No gallop.  Pulmonary:     Effort: Pulmonary effort is normal. No respiratory distress.     Breath sounds: Normal breath sounds. No wheezing, rhonchi or rales.  Abdominal:     General: Bowel sounds are normal. There is no distension.     Palpations: Abdomen is soft.  Tenderness: There is no abdominal tenderness. There is no guarding or rebound.  Skin:    General: Skin is warm and dry.  Neurological:     Mental Status: She is alert and oriented to person, place, and time.  Psychiatric:        Behavior: Behavior normal.    BP 110/80 (BP Location: Left Arm, Patient Position: Sitting, Cuff Size: Normal)   Pulse 75   Temp 98.7 F (37.1 C) (Oral)   Resp 18   Ht 5' 6.25" (1.683 m)   Wt 211 lb 9.6 oz (96 kg)   SpO2 99%   BMI 33.90 kg/m  Wt Readings from Last 3 Encounters:  04/03/21 211 lb 9.6 oz (96 kg)  03/02/21 213 lb (96.6 kg)  02/13/21 213 lb 2 oz (96.7 kg)       Assessment & Plan:   Problem List Items Addressed This Visit       Unprioritized   Syncope - Primary    Occurred in the setting of COVID-19. Reports negative ER evaluation at that time and has had no further symptoms. Will monitor.        Iron deficiency anemia    Last hemoglobin was improving. Recommended that she change the iron to 325mg  QOD.        Relevant Medications   Ferrous Sulfate (SLOW FE) 142 (45 Fe) MG TBCR   Hypothyroidism    Lab Results  Component Value Date   TSH 0.87 02/03/2021  TSH stable on synthroid 175 mcg once daily. Continue same.       Celiac disease    This is a new diagnosis for her. She is feeling better on a gluten free diet.  Pt ed material given on gluten free diet.        Anxiety and depression    Stable on citalopram 40mg  once daily.          Meds ordered this encounter  Medications   Ferrous Sulfate (SLOW FE) 142 (45 Fe) MG TBCR    Sig:  Take 1 tablet by mouth every other day.    Dispense:  90 tablet    Refill:  3    Order Specific Question:   Supervising Provider    Answer:   02/05/2021 A [[id number]]    I, NP, personally preformed the services described in this documentation.  All medical record entries made by the scribe were at my direction and in my presence.  I have reviewed the chart and discharge instructions (if applicable) and agree that the record reflects my personal performance and is accurate and complete. 04/03/2021   I,[name],acting as a Sandford Craze for 04/05/2021, NP.,have documented all relevant documentation on the behalf of Neurosurgeon, NP,as directed by  Lemont Fillers, NP while in the presence of Lemont Fillers, NP.   Lemont Fillers, NP

## 2021-04-03 NOTE — Assessment & Plan Note (Signed)
Occurred in the setting of COVID-19. Reports negative ER evaluation at that time and has had no further symptoms. Will monitor.

## 2021-04-03 NOTE — Assessment & Plan Note (Signed)
Lab Results  Component Value Date   TSH 0.87 02/03/2021   TSH stable on synthroid 175 mcg once daily. Continue same.

## 2021-04-03 NOTE — Patient Instructions (Signed)
Please continue gluten free diet.

## 2021-04-03 NOTE — Assessment & Plan Note (Signed)
Last hemoglobin was improving. Recommended that she change the iron to 325mg  QOD.

## 2021-04-03 NOTE — Assessment & Plan Note (Signed)
Stable on citalopram 40mg  once daily.

## 2021-04-06 ENCOUNTER — Other Ambulatory Visit: Payer: Self-pay | Admitting: General Surgery

## 2021-04-06 DIAGNOSIS — K900 Celiac disease: Secondary | ICD-10-CM

## 2021-04-13 ENCOUNTER — Ambulatory Visit: Payer: Medicaid Other | Admitting: Gastroenterology

## 2021-04-29 ENCOUNTER — Telehealth: Payer: Self-pay

## 2021-04-29 NOTE — Telephone Encounter (Signed)
LVM for patient to schedule her 3 month follow up in September/October

## 2021-05-16 ENCOUNTER — Encounter: Payer: Self-pay | Admitting: Family

## 2021-05-18 MED ORDER — LEVOTHYROXINE SODIUM 175 MCG PO TABS: 175.0000 ug | ORAL_TABLET | Freq: Every day | ORAL | 3 refills | Status: DC

## 2021-05-18 NOTE — Telephone Encounter (Signed)
Pt called and made an appt to see you on Friday for abdominal, side and lower back issues and also wanted me to mention/follow up to this telephone encounter the request for synthroid instead of generic medication for non-gluten purposes.

## 2021-05-19 ENCOUNTER — Ambulatory Visit: Payer: Medicaid Other | Admitting: Family

## 2021-05-19 ENCOUNTER — Other Ambulatory Visit: Payer: Self-pay

## 2021-05-19 VITALS — BP 123/51 | HR 86 | Temp 98.8°F | Resp 16 | Wt 207.0 lb

## 2021-05-19 DIAGNOSIS — E039 Hypothyroidism, unspecified: Secondary | ICD-10-CM | POA: Diagnosis not present

## 2021-05-19 DIAGNOSIS — Z23 Encounter for immunization: Secondary | ICD-10-CM | POA: Diagnosis not present

## 2021-05-19 DIAGNOSIS — R1084 Generalized abdominal pain: Secondary | ICD-10-CM

## 2021-05-19 NOTE — Patient Instructions (Signed)
Let me know if symptoms worsen or if they do not continue to improve.

## 2021-05-19 NOTE — Assessment & Plan Note (Signed)
Patient was able to obtain brand name synthroid which is gluten free. Plan to recheck her TSH in 3 more months.  Lab Results  Component Value Date   TSH 0.87 02/03/2021

## 2021-05-19 NOTE — Assessment & Plan Note (Signed)
Lower abdominal pain preceded her menstrual cycle, could have been ovarian/uterine discomfort. This is improved.  Upper abdominal pain episode- ? If related to celiac disease. No acute abdominal findings on exam today.  Recommended the following: check CBC, CMET.  Keep upcoming appointment with GI. Maintain gluten free diet/adequate hydration. If symptoms worsen pt is advised to let us know as we will need to do abdominal imaging.

## 2021-05-19 NOTE — Progress Notes (Signed)
Subjective:     Patient ID: Jessica Villa, female    DOB: [birth year], 40 y.o.   MRN: [id number]  Chief Complaint  Patient presents with   Abdominal Pain    Complains of lower abdominal pain "on right and left side"   Back Pain    Low back pain    HPI  Pt is a 40 yr old female, recently diagnosed with Celiac Disease who reports that on Friday 8/26, she developed pain in bilateral lower abdomen.  Had uterine pain as well.  Insides felt "inflammed."  Period  started Sunday. Still has menstrual bleeding. She also reports that she had a day of bad upper abdominal pain.  This upper abdominal pain seems to happen if she doesn't eat a meal "on time."  She had some constipation. She tried dicyclomine but notes that response is variable. Felt debilitated by the pain.  Her diet remains strictly gluten free. No fever.  Denies dysuria or urinary frequency. Today's pain is 1/10.  She is scheduled to follow up with GI on 05/26/21.  Health Maintenance Due  Topic Date Due   COVID-19 Vaccine (3 - Booster for Pfizer series) 07/22/2020    Past Medical History:  Diagnosis Date   Anemia    during pregnancy   Anxiety    Depression    Eczema    Gallstones    History of ovarian cyst    Hx of varicella    Hypothyroidism    PONV (postoperative nausea and vomiting)     Past Surgical History:  Procedure Laterality Date   CESAREAN SECTION N/A 02/18/2016   Procedure: CESAREAN SECTION;  Surgeon: Everlene Farrier, MD;  Location: [facility];  Service: Obstetrics;  Laterality: N/A;   CESAREAN SECTION WITH BILATERAL TUBAL LIGATION N/A 01/10/2019   Procedure: CESAREAN SECTION WITH BILATERAL TUBAL LIGATION;  Surgeon: Everlene Farrier, MD;  Location: [facility];  Service: Obstetrics;  Laterality: N/A;  Repeat edc 4/29 NKDA Tracey RNFA   CHOLECYSTECTOMY N/A 08/22/2020   Procedure: LAPAROSCOPIC CHOLECYSTECTOMY;  Surgeon: Felicie Morn, MD;  Location: [facility];  Service: General;  Laterality: N/A;   WISDOM  TOOTH EXTRACTION     in high school per patient    Family History  Problem Relation Age of Onset   Depression Mother    Migraines Mother    Hypothyroidism Mother    Dementia Father    Diabetes Father    Depression Father    Hypothyroidism Father    Alcohol abuse Maternal Grandmother    Alcohol abuse Maternal Grandfather    Hypothyroidism Paternal Grandmother    Heart disease Paternal Grandfather    Diabetes Paternal Grandfather    Skin cancer Paternal Grandfather    Depression Sister    ADD / ADHD Sister    Irritable bowel syndrome Sister    Anxiety disorder Sister    Panic disorder Brother    Hypothyroidism Brother    Anxiety disorder Brother     Social History   Socioeconomic History   Marital status: Married    Spouse name: Not on file   Number of children: 2   Years of education: Not on file   Highest education level: Not on file  Occupational History   Occupation: stay at home mom  Tobacco Use   Smoking status: Never   Smokeless tobacco: Never  Vaping Use   Vaping Use: Never used  Substance and Sexual Activity   Alcohol use: Yes    Comment: 1 cocktail  or wine per month none since pregnancy   Drug use: No   Sexual activity: Yes    Partners: Male  Other Topics Concern   Not on file  Social History Narrative   2 step children ( 6year and 42 year old) lives with pt and her husband.    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   Has masters degree in education leadership   Has worked as a Dance movement psychotherapist, crocheting, no pets      Social Determinants of Corporate investment banker Strain: Not on Ship broker Insecurity: Not on file  Transportation Needs: Not on file  Physical Activity: Not on file  Stress: Not on file  Social Connections: Not on file  Intimate Partner Violence: Not on file    Outpatient Medications Prior to Visit  Medication Sig Dispense Refill   citalopram (CELEXA) 40 MG tablet Take 1 tablet (40  mg total) by mouth daily. (Patient taking differently: Take 40 mg by mouth at bedtime.) 90 tablet 1   dicyclomine (BENTYL) 10 MG capsule Take 1 capsule (10 mg total) by mouth every 6 (six) hours as needed for spasms. 90 capsule 0   Ferrous Sulfate (SLOW FE) 142 (45 Fe) MG TBCR Take 1 tablet by mouth every other day. 90 tablet 3   levothyroxine (SYNTHROID) 175 MCG tablet Take 1 tablet (175 mcg total) by mouth daily before breakfast. 30 tablet 3   Multiple Vitamins-Minerals (MULTIVITAMIN WITH MINERALS) tablet Take 1 tablet by mouth daily.     triamcinolone ointment (KENALOG) 0.1 % Apply 1 application topically 2 (two) times daily as needed (eczema).      No facility-administered medications prior to visit.    No Known Allergies  ROS See HPI     Objective:    Physical Exam Constitutional:      General: She is not in acute distress.    Appearance: Normal appearance. She is well-developed.  HENT:     Head: Normocephalic and atraumatic.     Right Ear: External ear normal.     Left Ear: External ear normal.  Eyes:     General: No scleral icterus. Neck:     Thyroid: No thyromegaly.  Cardiovascular:     Rate and Rhythm: Normal rate and regular rhythm.     Heart sounds: Normal heart sounds. No murmur heard. Pulmonary:     Effort: Pulmonary effort is normal. No respiratory distress.     Breath sounds: Normal breath sounds. No wheezing.  Abdominal:     General: Bowel sounds are increased.     Tenderness: There is generalized abdominal tenderness. There is no guarding or rebound.  Musculoskeletal:     Cervical back: Neck supple.  Skin:    General: Skin is warm and dry.  Neurological:     Mental Status: She is alert and oriented to person, place, and time.  Psychiatric:        Mood and Affect: Mood normal.        Behavior: Behavior normal.        Thought Content: Thought content normal.        Judgment: Judgment normal.    BP (!) 123/51 (BP Location: Right Arm, Patient Position:  Sitting, Cuff Size: Large)   Pulse 86   Temp 98.8 F (37.1 C) (Oral)   Resp 16   Wt 207 lb (93.9 kg)   SpO2 99%  BMI 33.16 kg/m  Wt Readings from Last 3 Encounters:  05/19/21 207 lb (93.9 kg)  04/03/21 211 lb 9.6 oz (96 kg)  03/02/21 213 lb (96.6 kg)       Assessment & Plan:   Problem List Items Addressed This Visit       Unprioritized   Hypothyroidism    Patient was able to obtain brand name synthroid which is gluten free. Plan to recheck her TSH in 3 more months.  Lab Results  Component Value Date   TSH 0.87 02/03/2021         Generalized abdominal pain - Primary    Lower abdominal pain preceded her menstrual cycle, could have been ovarian/uterine discomfort. This is improved.  Upper abdominal pain episode- ? If related to celiac disease. No acute abdominal findings on exam today.  Recommended the following: check CBC, CMET.  Keep upcoming appointment with GI. Maintain gluten free diet/adequate hydration. If symptoms worsen pt is advised to let us know as we will need to do abdominal imaging.        Relevant Orders   CBC with Differential/Platelet   Comp Met (CMET)   Other Visit Diagnoses     Needs flu shot       Relevant Orders   Flu Vaccine QUAD 6+ mos PF IM (Fluarix Quad PF) (Completed)       I am having Angelee Rissmiller maintain her citalopram, triamcinolone ointment, multivitamin with minerals, dicyclomine, Slow Fe, and levothyroxine.  No orders of the defined types were placed in this encounter.

## 2021-05-20 LAB — CBC WITH DIFFERENTIAL/PLATELET
Basophils Absolute: 0 10*3/uL (ref 0.0–0.1)
Basophils Relative: 0.6 % (ref 0.0–3.0)
Eosinophils Absolute: 0.2 10*3/uL (ref 0.0–0.7)
Eosinophils Relative: 3.1 % (ref 0.0–5.0)
HCT: 38.8 % (ref 36.0–46.0)
Hemoglobin: 12.8 g/dL (ref 12.0–15.0)
Lymphocytes Relative: 31.4 % (ref 12.0–46.0)
Lymphs Abs: 2.4 10*3/uL (ref 0.7–4.0)
MCHC: 32.9 g/dL (ref 30.0–36.0)
MCV: 87 fl (ref 78.0–100.0)
Monocytes Absolute: 0.7 10*3/uL (ref 0.1–1.0)
Monocytes Relative: 9.3 % (ref 3.0–12.0)
Neutro Abs: 4.3 10*3/uL (ref 1.4–7.7)
Neutrophils Relative %: 55.6 % (ref 43.0–77.0)
Platelets: 260 10*3/uL (ref 150.0–400.0)
RBC: 4.46 Mil/uL (ref 3.87–5.11)
RDW: 13.7 % (ref 11.5–15.5)
WBC: 7.7 10*3/uL (ref 4.0–10.5)

## 2021-05-20 LAB — COMPREHENSIVE METABOLIC PANEL
ALT: 23 U/L (ref 0–35)
AST: 25 U/L (ref 0–37)
Albumin: 4 g/dL (ref 3.5–5.2)
Alkaline Phosphatase: 56 U/L (ref 39–117)
BUN: 18 mg/dL (ref 6–23)
CO2: 25 mEq/L (ref 19–32)
Calcium: 9.4 mg/dL (ref 8.4–10.5)
Chloride: 104 mEq/L (ref 96–112)
Creatinine, Ser: 0.81 mg/dL (ref 0.40–1.20)
GFR: 89.46 mL/min (ref >60.00)
Glucose, Bld: 84 mg/dL (ref 70–99)
Potassium: 3.6 mEq/L (ref 3.5–5.1)
Sodium: 138 mEq/L (ref 135–145)
Total Bilirubin: 0.4 mg/dL (ref 0.2–1.2)
Total Protein: 6.8 g/dL (ref 6.0–8.3)

## 2021-05-26 ENCOUNTER — Ambulatory Visit: Payer: Medicaid Other | Admitting: Gastroenterology

## 2021-05-29 ENCOUNTER — Encounter: Payer: Self-pay | Admitting: Dietician

## 2021-05-29 ENCOUNTER — Other Ambulatory Visit: Payer: Self-pay

## 2021-05-29 ENCOUNTER — Encounter: Payer: Medicaid Other | Attending: Gastroenterology | Admitting: Dietician

## 2021-05-29 DIAGNOSIS — K900 Celiac disease: Secondary | ICD-10-CM | POA: Insufficient documentation

## 2021-05-29 NOTE — Progress Notes (Signed)
Medical Nutrition Therapy  Appointment Start time:  [phone]  Appointment End time:  1105  Primary concerns today: newly diagnosed Celiac 02/2021  Referral diagnosis: celia disease Preferred learning style: no preference indicated Learning readiness: ready, change in progress   NUTRITION ASSESSMENT   Anthropometrics  66" 210 lbs 05/29/2021  Clinical Medical Hx: Celiac (2022), hypothyroidism Medications: see list Labs: see chart Notable Signs/Symptoms: abdominal pain with consumption of gluten  Lifestyle & Dietary Hx Patient lives with her husband and 4 children (ages 2 and 5 biological, 65 and 49 joint custody). Food insecurity at times as her husband is being taken to court for child support.  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  She is homeschooling. She has friends that also have celiac.   Supplements: MVI Sleep: unknown Stress / self-care: high stress Current average weekly physical activity: general ADL's.  Limited due to busy schedule and young children  24-Hr Dietary Recall When I get too hungry I get a horrible pain - stomach cramp.  Gluten reaction is pain. First Meal: eggs, meat or black beans OR corn chex, 2% milk, coffee with milk, fruit OR smoothie (yogurt, fruit, almond milk, kale) Snack: Lara bars or corn chips and salsa or fruit and PB Second Meal: rice and beans OR tuna or egg salad on GF bread, fruit or carrots or chips and salsa OR salad jar with chick peas Snack: similar to afternoon OR rice cakes and peanut butter Third Meal: rice, Thai basil chicken stir fry OR Zucchini with meat sauce, GF pasta OR curry Snack: the rest of dinner OR ice cream OR popcorn Beverages: water, coffee, 1 coke zero, rare hard alcohol  NUTRITION DIAGNOSIS  NB-1.1 Food and nutrition-related knowledge deficit As related to Gluten Free Diet.  As evidenced by diet hx and patient report.   NUTRITION INTERVENTION  Nutrition education (E-1) on the following topics:   Gluten free diet and importance Sources of gluten Options that do not contain gluten Cross contamination in her own kitchen and out to eat Feeding a family with different food preferences and needs resources  Handouts Provided Include  Celiac Nutrition therapy from AND Celiac label reading tips from AND Celiac meal tips from AND Food insecurity handouts   Learning Style & Readiness for Change Teaching method utilized: Visual & Auditory  Demonstrated degree of understanding via: Teach Back  Barriers to learning/adherence to lifestyle change: finances, stress  Goals Established by Pt Look for places of food cross contamination Go through medicine cabinet to determine any Gluten containing medications Continue Gluten Free diet Continue to view CartridgeClearance.com.cy for meal ideas  "Division of Responsibility" regarding meals with your family. Consider Meal planning with your family.    Continue you Gluten free path Look for cross contamination  Check spices Check medications  [email or web address]   MONITORING & EVALUATION Dietary intake, weekly physical activity, prn.  Next Steps  Patient is to call for questions.

## 2021-05-29 NOTE — Patient Instructions (Addendum)
"  Division of Responsibility" regarding meals with your family. Consider Meal planning with your family.    Continue you Gluten free path Look for cross contamination  Check spices Check medications  [email or web address]

## 2021-05-30 ENCOUNTER — Other Ambulatory Visit: Payer: Self-pay | Admitting: Family

## 2021-05-30 ENCOUNTER — Other Ambulatory Visit: Payer: Self-pay | Admitting: Gastroenterology

## 2021-05-30 DIAGNOSIS — R1010 Upper abdominal pain, unspecified: Secondary | ICD-10-CM

## 2021-06-06 DIAGNOSIS — R079 Chest pain, unspecified: Secondary | ICD-10-CM | POA: Diagnosis not present

## 2021-06-06 DIAGNOSIS — K900 Celiac disease: Secondary | ICD-10-CM | POA: Diagnosis not present

## 2021-06-06 DIAGNOSIS — R55 Syncope and collapse: Secondary | ICD-10-CM | POA: Diagnosis not present

## 2021-06-06 DIAGNOSIS — M5489 Other dorsalgia: Secondary | ICD-10-CM | POA: Diagnosis not present

## 2021-06-06 DIAGNOSIS — R110 Nausea: Secondary | ICD-10-CM | POA: Diagnosis not present

## 2021-06-06 DIAGNOSIS — E039 Hypothyroidism, unspecified: Secondary | ICD-10-CM | POA: Diagnosis not present

## 2021-06-07 ENCOUNTER — Telehealth: Payer: Self-pay

## 2021-06-07 NOTE — Telephone Encounter (Signed)
Transition Care Management Follow-up Telephone Call Date of discharge and from where: 06/06/2021 from [facility] How have you been since you were released from the hospital? Pt stated that she is feeling some better. Pt did not have any questions or concerns.  Any questions or concerns? No  Items Reviewed: Did the pt receive and understand the discharge instructions provided? Yes  Medications obtained and verified? Yes  Other? No  Any new allergies since your discharge? No  Dietary orders reviewed? No Do you have support at home? Yes   Functional Questionnaire: (I = Independent and D = Dependent) ADLs: I  Bathing/Dressing- I  Meal Prep- I  Eating- I  Maintaining continence- I  Transferring/Ambulation- I  Managing Meds- I   Follow up appointments reviewed:  PCP Hospital f/u appt confirmed? No   Specialist Hospital f/u appt confirmed? Yes  Scheduled to see Doristine Locks, DO on 06/09/2021 @ 3:00pm. Are transportation arrangements needed? No  If their condition worsens, is the pt aware to call PCP or go to the Emergency Dept.? Yes Was the patient provided with contact information for the PCP's office or ED? Yes Was to pt encouraged to call back with questions or concerns? Yes

## 2021-06-09 ENCOUNTER — Encounter: Payer: Self-pay | Admitting: Gastroenterology

## 2021-06-09 ENCOUNTER — Other Ambulatory Visit (INDEPENDENT_AMBULATORY_CARE_PROVIDER_SITE_OTHER): Payer: Medicaid Other

## 2021-06-09 ENCOUNTER — Ambulatory Visit (INDEPENDENT_AMBULATORY_CARE_PROVIDER_SITE_OTHER): Payer: Medicaid Other | Admitting: Gastroenterology

## 2021-06-09 ENCOUNTER — Other Ambulatory Visit: Payer: Self-pay

## 2021-06-09 VITALS — BP 120/70 | HR 70 | Ht 66.0 in | Wt 208.4 lb

## 2021-06-09 DIAGNOSIS — R1013 Epigastric pain: Secondary | ICD-10-CM | POA: Diagnosis not present

## 2021-06-09 DIAGNOSIS — D509 Iron deficiency anemia, unspecified: Secondary | ICD-10-CM

## 2021-06-09 DIAGNOSIS — R55 Syncope and collapse: Secondary | ICD-10-CM | POA: Diagnosis not present

## 2021-06-09 DIAGNOSIS — K900 Celiac disease: Secondary | ICD-10-CM | POA: Diagnosis not present

## 2021-06-09 NOTE — Progress Notes (Signed)
Chief Complaint:    Celiac Disease  GI History: Jessica Villa is a 40 year old female with a history of depression, hypothyroidism, anemia and GERD.  S/P cholecystectomy 08/2020, C-section x 2, and tubal ligation.  Initially seen in the GI clinic on 02/13/2021 for evaluation of epigastric pain and diarrhea and IDA.  - 01/2021: Ferritin 7.6, iron 111, H/H 11.7/35.8 with MCV/RDW 81.5/15.8.  Elevated TTG IgG (17.6), TTG IgA (26.4), and normal IgA.  Normal CMP, GI PCR panel, ESR, CRP - 02/12/2021: Normal CT abdomen/pelvis - 03/02/2021: EGD: Normal esophagus and stomach.  Villous flattening with scalloped appearance and duodenum (path c/w Celiac Disease) - 06/06/2021: H/H 13.4/40.7 with MCV/RDW 88.7/13.1  HPI:     Patient is a 40 y.o. female presenting to the Gastroenterology Clinic for follow-up.  Initially seen in 01/2021 for evaluation of epigastric pain, diarrhea, and IDA.  EGD consistent with Celiac Disease.  Was referred to the Nutrition Clinic and has since started gluten-free diet.  Was seen in the ER on 06/06/2021 with near syncope and chest pain.  Went to ER via EMS. Largely unrevealing work-up and discharged home.   Today, states still some nausea. Otherwise, GI sxs had been improving. No longer with diarrhea. Can still have intermittent episodes of MEG pain, but less frequent occurring <1/week, typically 1 hour or so. Does improve with Bentyl, but not always.   Still taking ferrous sulfate QOD.    Review of systems:     No chest pain, no SOB, no fevers, no urinary sx   Past Medical History:  Diagnosis Date   Anemia    during pregnancy   Anxiety    Celiac disease    Depression    Eczema    Gallstones    History of ovarian cyst    Hx of varicella    Hypothyroidism    PONV (postoperative nausea and vomiting)     Patient's surgical history, family medical history, social history, medications and allergies were all reviewed in Epic    Current Outpatient Medications   Medication Sig Dispense Refill   citalopram (CELEXA) 40 MG tablet TAKE 1 TABLET BY MOUTH EVERY DAY 90 tablet 1   dicyclomine (BENTYL) 10 MG capsule TAKE 1 CAPSULE (10 MG TOTAL) BY MOUTH EVERY 6 (SIX) HOURS AS NEEDED FOR SPASMS. 90 capsule 0   Ferrous Sulfate (SLOW FE) 142 (45 Fe) MG TBCR Take 1 tablet by mouth every other day. 90 tablet 3   levothyroxine (SYNTHROID) 175 MCG tablet Take 1 tablet (175 mcg total) by mouth daily before breakfast. 30 tablet 3   Multiple Vitamins-Minerals (MULTIVITAMIN WITH MINERALS) tablet Take 1 tablet by mouth daily.     triamcinolone ointment (KENALOG) 0.1 % Apply 1 application topically 2 (two) times daily as needed (eczema).      No current facility-administered medications for this visit.    Physical Exam:     BP 120/70   Pulse 70   Ht $R'5\' 6"'Hj$  (1.676 m)   Wt 208 lb 6 oz (94.5 kg)   SpO2 98%   BMI 33.63 kg/m   GENERAL:  Pleasant female in NAD PSYCH: : Cooperative, normal affect CARDIAC:  RRR, no murmur heard, no peripheral edema PULM: Normal respiratory effort, lungs CTA bilaterally, no wheezing ABDOMEN: Minimal TTP in MEG, no rebound or guarding.  No peritoneal signs.  Nondistended, soft.  No obvious masses, no hepatomegaly,  normal bowel sounds SKIN:  turgor, no lesions seen Musculoskeletal:  Normal muscle tone, normal strength NEURO: Alert and  oriented x 3, no focal neurologic deficits   IMPRESSION and PLAN:    1) Celiac Disease 2) Iron deficiency anemia 3) Epigastric pain - Recheck iron panel.  If this has normalized, plan to stop oral iron therapy with repeat CBC and iron panel in another 3 months - Check vitamin D for co-malabsorption - Continue gluten-free diet - Check tTG in 6 months as surrogate marker - Continue Bentyl as needed for epigastric pain.  If less efficacious, could consider trialing Levsin  4) Syncopal event and presyncope - 1 syncopal event in June (in the setting of COVID-19) and another presyncopal event earlier  this month.  Otherwise unremarkable work-up - Cardiology referral   RTC in 6 months or sooner prn           Lavena Bullion ,DO, FACG 06/09/2021, 3:09 PM

## 2021-06-09 NOTE — Patient Instructions (Signed)
If you are age 42 or older, your body mass index should be between 23-30. Your Body mass index is 33.63 kg/m. If this is out of the aforementioned range listed, please consider follow up with your Primary Care Provider.  If you are age 36 or younger, your body mass index should be between 19-25. Your Body mass index is 33.63 kg/m. If this is out of the aformentioned range listed, please consider follow up with your Primary Care Provider.   __________________________________________________________  The [facility] providers would like to encourage you to use [facility] to communicate with providers for non-urgent requests or questions.  Due to long hold times on the telephone, sending your provider a message by [facility] may be a faster and more efficient way to get a response.  Please allow 48 business hours for a response.  Please remember that this is for non-urgent requests.   Please go to the lab on the 2nd floor [address] before you leave the office today.   Due to recent changes in healthcare laws, you may see the results of your imaging and laboratory studies on MyChart before your provider has had a chance to review them.  We understand that in some cases there may be results that are confusing or concerning to you. Not all laboratory results come back in the same time frame and the provider may be waiting for multiple results in order to interpret others.  Please give [place] 48 hours in order for your provider to thoroughly review all the results before contacting the office for clarification of your results.   We have placed a referral to Cardiology if you do not hear from them within 2 weeks please contact our office at [phone].  Please contact our office to schedule a return visit in 6 months  Thank you for choosing me and [facility].  Vito Cirigliano, D.O.

## 2021-06-10 ENCOUNTER — Encounter: Payer: Self-pay | Admitting: Family

## 2021-06-10 DIAGNOSIS — H547 Unspecified visual loss: Secondary | ICD-10-CM

## 2021-06-10 LAB — IRON,TIBC AND FERRITIN PANEL
%SAT: 14 % (calc) — ABNORMAL LOW (ref 16–45)
Ferritin: 22 ng/mL (ref 16–232)
Iron: 48 ug/dL (ref 40–190)
TIBC: 340 mcg/dL (calc) (ref 250–450)

## 2021-06-10 LAB — VITAMIN D 25 HYDROXY (VIT D DEFICIENCY, FRACTURES): VITD: 31.14 ng/mL (ref 30.00–100.00)

## 2021-06-12 ENCOUNTER — Telehealth: Payer: Self-pay | Admitting: General Surgery

## 2021-06-12 DIAGNOSIS — K900 Celiac disease: Secondary | ICD-10-CM

## 2021-06-12 DIAGNOSIS — D509 Iron deficiency anemia, unspecified: Secondary | ICD-10-CM

## 2021-06-12 NOTE — Telephone Encounter (Signed)
-----   Message from Gaffney V, DO sent at 06/12/2021  7:48 AM EDT ----- Vitamin D was normal.  Ferritin improving (22), but still some deficiency with iron 48, TIBC 340 and iron sat 14%.  Plan to continue oral iron therapy for another 3 months with recheck of CBC and iron panel at that time.

## 2021-06-12 NOTE — Telephone Encounter (Signed)
Notified the patient of her lab results, she was asked to continue taking her iron supplement po and verbalized understanding and agreed to continue with her iron. She will come to [facility] lab to have repeat labs drawn on 08/24/2021, pt requested that date- Orders placed in computer.

## 2021-07-03 ENCOUNTER — Encounter: Payer: Medicaid Other | Admitting: Family

## 2021-07-27 ENCOUNTER — Encounter: Payer: Self-pay | Admitting: Cardiology

## 2021-07-27 ENCOUNTER — Ambulatory Visit (INDEPENDENT_AMBULATORY_CARE_PROVIDER_SITE_OTHER): Payer: Medicaid Other

## 2021-07-27 ENCOUNTER — Ambulatory Visit: Payer: Medicaid Other | Admitting: Cardiology

## 2021-07-27 ENCOUNTER — Other Ambulatory Visit: Payer: Self-pay

## 2021-07-27 VITALS — BP 102/66 | HR 60 | Ht 66.0 in | Wt 210.0 lb

## 2021-07-27 DIAGNOSIS — R55 Syncope and collapse: Secondary | ICD-10-CM

## 2021-07-27 DIAGNOSIS — E669 Obesity, unspecified: Secondary | ICD-10-CM | POA: Diagnosis not present

## 2021-07-27 NOTE — Progress Notes (Signed)
Cardiology Office Note:    Date:  07/27/2021   ID:  Jessica Villa, DOB [birth year], MRN [id number]  PCP:  Debbrah Alar, NP  Cardiologist:  Berniece Salines, DO  Electrophysiologist:  None   Referring MD: Lavena Bullion, DO   Chief Complaint  Patient presents with   New Patient (Initial Visit)   History of Present Illness:    Jessica Villa is a 40 y.o. female with a hx of recently diagnosed celiac disease, hypothyroidism is here today to be evaluated for syncope. The patient tells me she has had 2 significant syncope episode.  The first was in June she describes this on June 28 while she was riding in a car with her husband to stop at a gas station.  At which time she became very nauseous.  She then wanted to go to the bathroom.  Her husband try to walk her to the bathroom and at some point in time she could not make it to the bathroom and tried to turn back around during that time she had an episode where she got worsening dizziness and passed out.  EMS were called she was taken to the emergency department and her work-up cardiac work-up was negative was noted to be COVID-positive.  Her second episode was in [place] on June 06, 2021 where she was at a park with her kids and then started experience nauseousness again.  She needed her to have a BM after that she stood up and got lightheaded during that time she felt sweaty and her breast became heavy.  She did not pass out she had some chest heaviness as well as some back pain.  She was able to get to her car with her kids and at this time EMS were called.  She was taken to emergency department to be evaluated.  At that time it was noted for vasovagal syncope. Since that episode she has not had any episodes.  She denies any lightheadedness or dizziness.  Past Medical History:  Diagnosis Date   Anemia    during pregnancy   Anxiety    Celiac disease    Depression    Eczema    Gallstones    History of ovarian cyst    Hx of  varicella    Hypothyroidism    PONV (postoperative nausea and vomiting)     Past Surgical History:  Procedure Laterality Date   CESAREAN SECTION N/A 02/18/2016   Procedure: CESAREAN SECTION;  Surgeon: Everlene Farrier, MD;  Location: [facility];  Service: Obstetrics;  Laterality: N/A;   CESAREAN SECTION WITH BILATERAL TUBAL LIGATION N/A 01/10/2019   Procedure: CESAREAN SECTION WITH BILATERAL TUBAL LIGATION;  Surgeon: Everlene Farrier, MD;  Location: [facility];  Service: Obstetrics;  Laterality: N/A;  Repeat edc 4/29 NKDA Tracey RNFA   CHOLECYSTECTOMY N/A 08/22/2020   Procedure: LAPAROSCOPIC CHOLECYSTECTOMY;  Surgeon: Felicie Morn, MD;  Location: [facility];  Service: General;  Laterality: N/A;   WISDOM TOOTH EXTRACTION     in high school per patient    Current Medications: Current Meds  Medication Sig   citalopram (CELEXA) 40 MG tablet TAKE 1 TABLET BY MOUTH EVERY DAY   dicyclomine (BENTYL) 10 MG capsule TAKE 1 CAPSULE (10 MG TOTAL) BY MOUTH EVERY 6 (SIX) HOURS AS NEEDED FOR SPASMS.   Ferrous Sulfate (SLOW FE) 142 (45 Fe) MG TBCR Take 1 tablet by mouth every other day.   levothyroxine (SYNTHROID) 175 MCG tablet Take 1 tablet (175 mcg total) by  mouth daily before breakfast.   Multiple Vitamins-Minerals (MULTIVITAMIN WITH MINERALS) tablet Take 1 tablet by mouth daily.   triamcinolone ointment (KENALOG) 0.1 % Apply 1 application topically 2 (two) times daily as needed (eczema).      Allergies:   Gluten meal   Social History   Socioeconomic History   Marital status: Married    Spouse name: Not on file   Number of children: 2   Years of education: Not on file   Highest education level: Not on file  Occupational History   Occupation: stay at home mom  Tobacco Use   Smoking status: Never   Smokeless tobacco: Never  Vaping Use   Vaping Use: Never used  Substance and Sexual Activity   Alcohol use: Yes    Comment: 1 cocktail or wine per month none since pregnancy   Drug  use: No   Sexual activity: Yes    Partners: Male  Other Topics Concern   Not on file  Social History Narrative   2 step children ( 6year and 42 year old) lives with pt and her husband.    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   Has masters degree in education leadership   Has worked as a Education officer, museum, crocheting, no pets      Social Determinants of Radio broadcast assistant Strain: Not on [employer] Insecurity: Not on file  Transportation Needs: Not on file  Physical Activity: Not on file  Stress: Not on file  Social Connections: Not on file     Family History: The patient's family history includes ADD / ADHD in her sister; Alcohol abuse in her maternal grandfather and maternal grandmother; Anxiety disorder in her brother and sister; Dementia in her father; Depression in her father, mother, and sister; Diabetes in her father and paternal grandfather; Heart disease in her paternal grandfather; Hypothyroidism in her brother, father, mother, and paternal grandmother; Irritable bowel syndrome in her sister; Migraines in her mother; Panic disorder in her brother; Skin cancer in her paternal grandfather.  ROS:   Review of Systems  Constitution: Negative for decreased appetite, fever and weight gain.  HENT: Negative for congestion, ear discharge, hoarse voice and sore throat.   Eyes: Negative for discharge, redness, vision loss in right eye and visual halos.  Cardiovascular: Negative for chest pain, dyspnea on exertion, leg swelling, orthopnea and palpitations.  Respiratory: Negative for cough, hemoptysis, shortness of breath and snoring.   Endocrine: Negative for heat intolerance and polyphagia.  Hematologic/Lymphatic: Negative for bleeding problem. Does not bruise/bleed easily.  Skin: Negative for flushing, nail changes, rash and suspicious lesions.  Musculoskeletal: Negative for arthritis, joint pain, muscle cramps, myalgias, neck pain and  stiffness.  Gastrointestinal: Negative for abdominal pain, bowel incontinence, diarrhea and excessive appetite.  Genitourinary: Negative for decreased libido, genital sores and incomplete emptying.  Neurological: Negative for brief paralysis, focal weakness, headaches and loss of balance.  Psychiatric/Behavioral: Negative for altered mental status, depression and suicidal ideas.  Allergic/Immunologic: Negative for HIV exposure and persistent infections.    EKGs/Labs/Other Studies Reviewed:    The following studies were reviewed today:   EKG:  The ekg ordered today demonstrates sinus rhythm, heart rate 60 bpm.  Recent Labs: 02/03/2021: TSH 0.87 05/19/2021: ALT 23; BUN 18; Creatinine, Ser 0.81; Hemoglobin 12.8; Platelets 260.0; Potassium 3.6; Sodium 138  Recent Lipid Panel    Component Value Date/Time   CHOL  153 05/30/2020 0944   TRIG 41 05/30/2020 0944   HDL 53 05/30/2020 0944   CHOLHDL 2.9 05/30/2020 0944   CHOLHDL 3 05/01/2019 1532   VLDL 7.0 05/01/2019 1532   LDLCALC 91 05/30/2020 0944   LDLCALC 91 04/21/2018 1514    Physical Exam:    VS:  BP 102/66 (BP Location: Left Arm, Patient Position: Sitting, Cuff Size: Large)   Pulse 60   Ht 5\' 6"  (1.676 m)   Wt 210 lb (95.3 kg)   BMI 33.89 kg/m     Wt Readings from Last 3 Encounters:  07/27/21 210 lb (95.3 kg)  06/09/21 208 lb 6 oz (94.5 kg)  05/29/21 210 lb (95.3 kg)     GEN: Well nourished, well developed in no acute distress HEENT: Normal NECK: No JVD; No carotid bruits LYMPHATICS: No lymphadenopathy CARDIAC: S1S2 noted,RRR, no murmurs, rubs, gallops RESPIRATORY:  Clear to auscultation without rales, wheezing or rhonchi  ABDOMEN: Soft, non-tender, non-distended, +bowel sounds, no guarding. EXTREMITIES: No edema, No cyanosis, no clubbing MUSCULOSKELETAL:  No deformity  SKIN: Warm and dry NEUROLOGIC:  Alert and oriented x 3, non-focal PSYCHIATRIC:  Normal affect, good insight  ASSESSMENT:    1. Syncope and  collapse   2. Obesity (BMI 30-39.9)    PLAN:     I would like to rule out a cardiovascular etiology of this syncope, therefore at this time I would like to placed a zio patch for  14 days. In additon a transthoracic echocardiogram will be ordered to assess LV/RV function and any structural abnormalities. Once these testing have been performed amd reviewed further reccomendations will be made. For now, I do reccomend that the patient goes to the nearest ED if  symptoms recur.  The patient is in agreement with the above plan. The patient left the office in stable condition.  The patient will follow up in 4 months or sooner if needed.   Medication Adjustments/Labs and Tests Ordered: Current medicines are reviewed at length with the patient today.  Concerns regarding medicines are outlined above.  Orders Placed This Encounter  Procedures   LONG TERM MONITOR-LIVE TELEMETRY (3-14 DAYS)   EKG 12-Lead   ECHOCARDIOGRAM COMPLETE    No orders of the defined types were placed in this encounter.   Patient Instructions  Medication Instructions:  Your physician recommends that you continue on your current medications as directed. Please refer to the Current Medication list given to you today.  *If you need a refill on your cardiac medications before your next appointment, please call your pharmacy*   Lab Work: None If you have labs (blood work) drawn today and your tests are completely normal, you will receive your results only by: MyChart Message (if you have MyChart) OR A paper copy in the mail If you have any lab test that is abnormal or we need to change your treatment, we will call you to review the results.   Testing/Procedures: Your physician has requested that you have an echocardiogram. Echocardiography is a painless test that uses sound waves to create images of your heart. It provides your doctor with information about the size and shape of your heart and how well your heart's  chambers and valves are working. This procedure takes approximately one hour. There are no restrictions for this procedure.   A zio monitor was ordered today. It will remain on for 14 days. You will then return monitor and event diary in provided box. It takes 1-2 weeks for report to  be downloaded and returned to [place]. We will call you with the results. If monitor falls off or has orange flashing light, please call Zio for further instructions.   ZIO  WHY IS MY DOCTOR PRESCRIBING ZIO? The Zio system is proven and trusted by physicians to detect and diagnose irregular heart rhythms -- and has been prescribed to hundreds of thousands of patients.  The FDA has cleared the Zio system to monitor for many different kinds of irregular heart rhythms. In a study, physicians were able to reach a diagnosis 90% of the time with the Zio system1.  You can wear the Zio monitor -- a small, discreet, comfortable patch -- during your normal day-to-day activity, including while you sleep, shower, and exercise, while it records every single heartbeat for analysis.  1Barrett, P., et al. Comparison of 24 Hour Holter Monitoring Versus 14 Day Novel Adhesive Patch Electrocardiographic Monitoring. [place], 2014.  ZIO VS. HOLTER MONITORING The Zio monitor can be comfortably worn for up to 14 days. Holter monitors can be worn for 24 to 48 hours, limiting the time to record any irregular heart rhythms you may have. Zio is able to capture data for the 51% of patients who have their first symptom-triggered arrhythmia after 48 hours.1  LIVE WITHOUT RESTRICTIONS The Zio ambulatory cardiac monitor is a small, unobtrusive, and water-resistant patch--you might even forget you're wearing it. The Zio monitor records and stores every beat of your heart, whether you're sleeping, working out, or showering. Remove 14 days after application.    Follow-Up: At [facility], you and your health needs are our  priority.  As part of our continuing mission to provide you with exceptional heart care, we have created designated Provider Care Teams.  These Care Teams include your primary Cardiologist (physician) and Advanced Practice Providers (APPs -  Physician Assistants and Nurse Practitioners) who all work together to provide you with the care you need, when you need it.  We recommend signing up for the patient portal called "MyChart".  Sign up information is provided on this After Visit Summary.  MyChart is used to connect with patients for Virtual Visits (Telemedicine).  Patients are able to view lab/test results, encounter notes, upcoming appointments, etc.  Non-urgent messages can be sent to your provider as well.   To learn more about what you can do with MyChart, go to [email or web address].    Your next appointment:   4 month(s)  The format for your next appointment:   In Person  Provider:   Berniece Salines, DO {    Other Instructions Echocardiogram An echocardiogram is a test that uses sound waves (ultrasound) to produce images of the heart. Images from an echocardiogram can provide important information about: Heart size and shape. The size and thickness and movement of your heart's walls. Heart muscle function and strength. Heart valve function or if you have stenosis. Stenosis is when the heart valves are too narrow. If blood is flowing backward through the heart valves (regurgitation). A tumor or infectious growth around the heart valves. Areas of heart muscle that are not working well because of poor blood flow or injury from a heart attack. Aneurysm detection. An aneurysm is a weak or damaged part of an artery wall. The wall bulges out from the normal force of blood pumping through the body. Tell a health care provider about: Any allergies you have. All medicines you are taking, including vitamins, herbs, eye drops, creams, and over-the-counter medicines. Any blood disorders  you  have. Any surgeries you have had. Any medical conditions you have. Whether you are pregnant or may be pregnant. What are the risks? Generally, this is a safe test. However, problems may occur, including an allergic reaction to dye (contrast) that may be used during the test. What happens before the test? No specific preparation is needed. You may eat and drink normally. What happens during the test?  You will take off your clothes from the waist up and put on a hospital gown. Electrodes or electrocardiogram (ECG)patches may be placed on your chest. The electrodes or patches are then connected to a device that monitors your heart rate and rhythm. You will lie down on a table for an ultrasound exam. A gel will be applied to your chest to help sound waves pass through your skin. A handheld device, called a transducer, will be pressed against your chest and moved over your heart. The transducer produces sound waves that travel to your heart and bounce back (or "echo" back) to the transducer. These sound waves will be captured in real-time and changed into images of your heart that can be viewed on a video monitor. The images will be recorded on a computer and reviewed by your health care provider. You may be asked to change positions or hold your breath for a short time. This makes it easier to get different views or better views of your heart. In some cases, you may receive contrast through an IV in one of your veins. This can improve the quality of the pictures from your heart. The procedure may vary among health care providers and hospitals. What can I expect after the test? You may return to your normal, everyday life, including diet, activities, and medicines, unless your health care provider tells you not to do that. Follow these instructions at home: It is up to you to get the results of your test. Ask your health care provider, or the department that is doing the test, when your results will  be ready. Keep all follow-up visits. This is important. Summary An echocardiogram is a test that uses sound waves (ultrasound) to produce images of the heart. Images from an echocardiogram can provide important information about the size and shape of your heart, heart muscle function, heart valve function, and other possible heart problems. You do not need to do anything to prepare before this test. You may eat and drink normally. After the echocardiogram is completed, you may return to your normal, everyday life, unless your health care provider tells you not to do that. This information is not intended to replace advice given to you by your health care provider. Make sure you discuss any questions you have with your health care provider. Document Revised: 05/20/2021 Document Reviewed: 04/29/2020 Elsevier Patient Education  2022 [place].     Adopting a Healthy Lifestyle.  Know what a healthy weight is for you (roughly BMI <25) and aim to maintain this   Aim for 7+ servings of fruits and vegetables daily   65-80+ fluid ounces of water or unsweet tea for healthy kidneys   Limit to max 1 drink of alcohol per day; avoid smoking/tobacco   Limit animal fats in diet for cholesterol and heart health - choose grass fed whenever available   Avoid highly processed foods, and foods high in saturated/trans fats   Aim for low stress - take time to unwind and care for your mental health   Aim for 150 min of moderate  intensity exercise weekly for heart health, and weights twice weekly for bone health   Aim for 7-9 hours of sleep daily   When it comes to diets, agreement about the perfect plan isnt easy to find, even among the experts. Experts at the [employer] developed an idea known as the Healthy Eating Plate. Just imagine a plate divided into logical, healthy portions.   The emphasis is on diet quality:   Load up on vegetables and fruits - one-half of your plate:  Aim for color and variety, and remember that potatoes dont count.   Go for whole grains - one-quarter of your plate: Whole wheat, barley, wheat berries, quinoa, oats, brown rice, and foods made with them. If you want pasta, go with whole wheat pasta.   Protein power - one-quarter of your plate: Fish, chicken, beans, and nuts are all healthy, versatile protein sources. Limit red meat.   The diet, however, does go beyond the plate, offering a few other suggestions.   Use healthy plant oils, such as olive, canola, soy, corn, sunflower and peanut. Check the labels, and avoid partially hydrogenated oil, which have unhealthy trans fats.   If youre thirsty, drink water. Coffee and tea are good in moderation, but skip sugary drinks and limit milk and dairy products to one or two daily servings.   The type of carbohydrate in the diet is more important than the amount. Some sources of carbohydrates, such as vegetables, fruits, whole grains, and beans-are healthier than others.   Finally, stay active  Signed, Berniece Salines, DO  07/27/2021 9:24 AM    [place]

## 2021-07-27 NOTE — Patient Instructions (Addendum)
Medication Instructions:  Your physician recommends that you continue on your current medications as directed. Please refer to the Current Medication list given to you today.  *If you need a refill on your cardiac medications before your next appointment, please call your pharmacy*   Lab Work: None If you have labs (blood work) drawn today and your tests are completely normal, you will receive your results only by: [place] (if you have MyChart) OR A paper copy in the mail If you have any lab test that is abnormal or we need to change your treatment, we will call you to review the results.   Testing/Procedures: Your physician has requested that you have an echocardiogram. Echocardiography is a painless test that uses sound waves to create images of your heart. It provides your doctor with information about the size and shape of your heart and how well your heart's chambers and valves are working. This procedure takes approximately one hour. There are no restrictions for this procedure.   A zio monitor was ordered today. It will remain on for 14 days. You will then return monitor and event diary in provided box. It takes 1-2 weeks for report to be downloaded and returned to [place]. We will call you with the results. If monitor falls off or has orange flashing light, please call Zio for further instructions.   ZIO  WHY IS MY DOCTOR PRESCRIBING ZIO? The Zio system is proven and trusted by physicians to detect and diagnose irregular heart rhythms -- and has been prescribed to hundreds of thousands of patients.  The FDA has cleared the Zio system to monitor for many different kinds of irregular heart rhythms. In a study, physicians were able to reach a diagnosis 90% of the time with the Zio system1.  You can wear the Zio monitor -- a small, discreet, comfortable patch -- during your normal day-to-day activity, including while you sleep, shower, and exercise, while it records every single  heartbeat for analysis.  1Barrett, P., et al. Comparison of 24 Hour Holter Monitoring Versus 14 Day Novel Adhesive Patch Electrocardiographic Monitoring. [place], 2014.  ZIO VS. HOLTER MONITORING The Zio monitor can be comfortably worn for up to 14 days. Holter monitors can be worn for 24 to 48 hours, limiting the time to record any irregular heart rhythms you may have. Zio is able to capture data for the 51% of patients who have their first symptom-triggered arrhythmia after 48 hours.1  LIVE WITHOUT RESTRICTIONS The Zio ambulatory cardiac monitor is a small, unobtrusive, and water-resistant patch--you might even forget you're wearing it. The Zio monitor records and stores every beat of your heart, whether you're sleeping, working out, or showering. Remove 14 days after application.    Follow-Up: At [facility], you and your health needs are our priority.  As part of our continuing mission to provide you with exceptional heart care, we have created designated Provider Care Teams.  These Care Teams include your primary Cardiologist (physician) and Advanced Practice Providers (APPs -  Physician Assistants and Nurse Practitioners) who all work together to provide you with the care you need, when you need it.  We recommend signing up for the patient portal called "MyChart".  Sign up information is provided on this After Visit Summary.  MyChart is used to connect with patients for Virtual Visits (Telemedicine).  Patients are able to view lab/test results, encounter notes, upcoming appointments, etc.  Non-urgent messages can be sent to your provider as well.  To learn more about what you can do with MyChart, go to [email or web address].    Your next appointment:   4 month(s)  The format for your next appointment:   In Person  Provider:   Berniece Salines, DO {    Other Instructions Echocardiogram An echocardiogram is a test that uses sound waves (ultrasound) to produce  images of the heart. Images from an echocardiogram can provide important information about: Heart size and shape. The size and thickness and movement of your heart's walls. Heart muscle function and strength. Heart valve function or if you have stenosis. Stenosis is when the heart valves are too narrow. If blood is flowing backward through the heart valves (regurgitation). A tumor or infectious growth around the heart valves. Areas of heart muscle that are not working well because of poor blood flow or injury from a heart attack. Aneurysm detection. An aneurysm is a weak or damaged part of an artery wall. The wall bulges out from the normal force of blood pumping through the body. Tell a health care provider about: Any allergies you have. All medicines you are taking, including vitamins, herbs, eye drops, creams, and over-the-counter medicines. Any blood disorders you have. Any surgeries you have had. Any medical conditions you have. Whether you are pregnant or may be pregnant. What are the risks? Generally, this is a safe test. However, problems may occur, including an allergic reaction to dye (contrast) that may be used during the test. What happens before the test? No specific preparation is needed. You may eat and drink normally. What happens during the test?  You will take off your clothes from the waist up and put on a hospital gown. Electrodes or electrocardiogram (ECG)patches may be placed on your chest. The electrodes or patches are then connected to a device that monitors your heart rate and rhythm. You will lie down on a table for an ultrasound exam. A gel will be applied to your chest to help sound waves pass through your skin. A handheld device, called a transducer, will be pressed against your chest and moved over your heart. The transducer produces sound waves that travel to your heart and bounce back (or "echo" back) to the transducer. These sound waves will be captured in  real-time and changed into images of your heart that can be viewed on a video monitor. The images will be recorded on a computer and reviewed by your health care provider. You may be asked to change positions or hold your breath for a short time. This makes it easier to get different views or better views of your heart. In some cases, you may receive contrast through an IV in one of your veins. This can improve the quality of the pictures from your heart. The procedure may vary among health care providers and hospitals. What can I expect after the test? You may return to your normal, everyday life, including diet, activities, and medicines, unless your health care provider tells you not to do that. Follow these instructions at home: It is up to you to get the results of your test. Ask your health care provider, or the department that is doing the test, when your results will be ready. Keep all follow-up visits. This is important. Summary An echocardiogram is a test that uses sound waves (ultrasound) to produce images of the heart. Images from an echocardiogram can provide important information about the size and shape of your heart, heart muscle function, heart valve function, and other possible  heart problems. You do not need to do anything to prepare before this test. You may eat and drink normally. After the echocardiogram is completed, you may return to your normal, everyday life, unless your health care provider tells you not to do that. This information is not intended to replace advice given to you by your health care provider. Make sure you discuss any questions you have with your health care provider. Document Revised: 05/20/2021 Document Reviewed: 04/29/2020 Elsevier Patient Education  2022 [employer].    Directions to [place] [address]: [address] from [place] turn left on [address], go through 2 stoplights , entrance directly after, 1st of 3 2-3 story red brick buildings,  parking deck in back

## 2021-07-27 NOTE — Progress Notes (Unsigned)
[id number] 14 day ZIO AT from office inventory applied to patient.

## 2021-07-29 ENCOUNTER — Telehealth: Payer: Self-pay | Admitting: Cardiology

## 2021-07-29 NOTE — Telephone Encounter (Signed)
Patient states her heart monitor is causing extreme irritation and itchiness. She states she contacted the company and they advised her to try Neosporin. She didn't mention whether or not she tried the Neosporin, but she states it still itches really bad. She would like some advisement from Dr. Servando Salina. Please return call to discuss when able.

## 2021-07-31 NOTE — Telephone Encounter (Signed)
Called pt. She states "It's getting better. I've been using cream and rubbing it" PT thanked me for calling and checking on her.

## 2021-08-07 ENCOUNTER — Encounter: Payer: Self-pay | Admitting: Cardiology

## 2021-08-07 ENCOUNTER — Telehealth: Payer: Self-pay | Admitting: Family

## 2021-08-07 ENCOUNTER — Encounter: Payer: Self-pay | Admitting: Gastroenterology

## 2021-08-07 ENCOUNTER — Ambulatory Visit (INDEPENDENT_AMBULATORY_CARE_PROVIDER_SITE_OTHER): Payer: Medicaid Other

## 2021-08-07 ENCOUNTER — Other Ambulatory Visit: Payer: Self-pay

## 2021-08-07 DIAGNOSIS — E039 Hypothyroidism, unspecified: Secondary | ICD-10-CM

## 2021-08-07 DIAGNOSIS — R55 Syncope and collapse: Secondary | ICD-10-CM | POA: Diagnosis not present

## 2021-08-07 LAB — ECHOCARDIOGRAM COMPLETE
AR max vel: 2.69 cm2
AV Area VTI: 2.81 cm2
AV Area mean vel: 2.77 cm2
AV Mean grad: 5 mmHg
AV Peak grad: 10.8 mmHg
Ao pk vel: 1.64 m/s
Area-P 1/2: 3.03 cm2
Calc EF: 63.1 %
S' Lateral: 3.09 cm
Single Plane A2C EF: 62.4 %
Single Plane A4C EF: 63.7 %

## 2021-08-07 NOTE — Telephone Encounter (Signed)
She needs a TSH dx hypothyroidism please.

## 2021-08-07 NOTE — Telephone Encounter (Signed)
Pt stated she was overdue for labs with [facility] and was wondering if they were still due since it was so long ago. Informed her we could schedule recent labs for [facility] as well. Please advise.

## 2021-08-07 NOTE — Telephone Encounter (Signed)
Spoke to patient and scheduled her to come in for [facility] 08-10-21

## 2021-08-10 ENCOUNTER — Other Ambulatory Visit (INDEPENDENT_AMBULATORY_CARE_PROVIDER_SITE_OTHER): Payer: Medicaid Other

## 2021-08-10 ENCOUNTER — Other Ambulatory Visit: Payer: Self-pay

## 2021-08-10 DIAGNOSIS — E039 Hypothyroidism, unspecified: Secondary | ICD-10-CM

## 2021-08-10 DIAGNOSIS — K900 Celiac disease: Secondary | ICD-10-CM | POA: Diagnosis not present

## 2021-08-10 DIAGNOSIS — D509 Iron deficiency anemia, unspecified: Secondary | ICD-10-CM

## 2021-08-10 LAB — CBC WITH DIFFERENTIAL/PLATELET
Basophils Absolute: 0 10*3/uL (ref 0.0–0.1)
Basophils Relative: 0.7 % (ref 0.0–3.0)
Eosinophils Absolute: 0.2 10*3/uL (ref 0.0–0.7)
Eosinophils Relative: 3.6 % (ref 0.0–5.0)
HCT: 40.8 % (ref 36.0–46.0)
Hemoglobin: 13.5 g/dL (ref 12.0–15.0)
Lymphocytes Relative: 33 % (ref 12.0–46.0)
Lymphs Abs: 2.2 10*3/uL (ref 0.7–4.0)
MCHC: 33.1 g/dL (ref 30.0–36.0)
MCV: 89.1 fl (ref 78.0–100.0)
Monocytes Absolute: 0.6 10*3/uL (ref 0.1–1.0)
Monocytes Relative: 9.2 % (ref 3.0–12.0)
Neutro Abs: 3.5 10*3/uL (ref 1.4–7.7)
Neutrophils Relative %: 53.5 % (ref 43.0–77.0)
Platelets: 233 10*3/uL (ref 150.0–400.0)
RBC: 4.58 Mil/uL (ref 3.87–5.11)
RDW: 13 % (ref 11.5–15.5)
WBC: 6.6 10*3/uL (ref 4.0–10.5)

## 2021-08-10 LAB — IRON: Iron: 170 ug/dL — ABNORMAL HIGH (ref 42–145)

## 2021-08-10 LAB — FERRITIN: Ferritin: 18.8 ng/mL (ref 10.0–291.0)

## 2021-08-10 LAB — VITAMIN D 25 HYDROXY (VIT D DEFICIENCY, FRACTURES): VITD: 27.96 ng/mL — ABNORMAL LOW (ref 30.00–100.00)

## 2021-08-11 ENCOUNTER — Telehealth: Payer: Self-pay | Admitting: Family

## 2021-08-11 LAB — IRON,TIBC AND FERRITIN PANEL
%SAT: 55 % (calc) — ABNORMAL HIGH (ref 16–45)
Ferritin: 21 ng/mL (ref 16–232)
Iron: 171 ug/dL (ref 40–190)
TIBC: 313 mcg/dL (calc) (ref 250–450)

## 2021-08-11 LAB — TSH: TSH: 3.18 u[IU]/mL (ref 0.35–5.50)

## 2021-08-11 NOTE — Telephone Encounter (Signed)
See mychart.  

## 2021-08-24 ENCOUNTER — Encounter: Payer: Self-pay | Admitting: Cardiology

## 2021-08-24 ENCOUNTER — Telehealth: Payer: Self-pay | Admitting: General Surgery

## 2021-08-24 DIAGNOSIS — E559 Vitamin D deficiency, unspecified: Secondary | ICD-10-CM

## 2021-08-24 DIAGNOSIS — D509 Iron deficiency anemia, unspecified: Secondary | ICD-10-CM

## 2021-08-24 MED ORDER — VITAMIN D (ERGOCALCIFEROL) 1.25 MG (50000 UNIT) PO CAPS: 50000.0000 [IU] | ORAL_CAPSULE | ORAL | 0 refills | Status: DC

## 2021-08-24 NOTE — Telephone Encounter (Signed)
-----   Message from [place] V, DO sent at 08/21/2021  4:08 PM EST ----- Labs notable for normal iron with elevated iron saturation 55%.  This was previously low, consistent with iron deficiency anemia.  Overall iron stores seem to be improving and responding to oral iron therapy and the anemia has normalized with H/H 13.5/40.8.  Okay with stopping oral iron therapy at this time, particular given the side effect profile of the medication so far.  We will continue to monitor.  Vitamin D slightly low at 27.9.  - Start Ergocalciferol 50,000 units weekly.  Take 1 tablet every week for 8 weeks, #8, RF0 - Following completion of ergocalciferol, start vitamin D daily supplement 2000 IU daily - Recheck vitamin D, CBC, iron panel in 3 months

## 2021-08-24 NOTE — Telephone Encounter (Signed)
Spoke with the patient and she verbalized understanding of her labs and to discontinue her iron and start 50,000 units of vit d weekly for 8 weeks. Then start 2000 IU daily. Pt will have labs drawn in 3 months.

## 2021-08-25 ENCOUNTER — Telehealth: Payer: Self-pay | Admitting: Cardiology

## 2021-08-25 ENCOUNTER — Other Ambulatory Visit: Payer: Self-pay

## 2021-08-25 MED ORDER — PROPRANOLOL HCL 10 MG PO TABS: 10.0000 mg | ORAL_TABLET | Freq: Every evening | ORAL | 3 refills | Status: DC

## 2021-08-25 NOTE — Telephone Encounter (Signed)
-----   Message from Reynolds Bowl, RN sent at 08/25/2021 10:27 AM EST ----- Please advise.

## 2021-08-25 NOTE — Telephone Encounter (Signed)
Based on the patient messaged with questions about the use of propanolol, I was able to call the patient and speak with her.  I advised the patient that propanolol actually may have an increased effect with the use of citalopram.  But given the fact that she is on a very small dose of 10 mg daily we may not see any significant impact.  Explained to the patient that effect usually with propranolol is a potentiating effect from citalopram.  She is agreeable to use propanolol 10 mg daily. In addition while we were talking on the phone the patient started to cry and tell me that she is very overwhelmed.  She is undergoing a lot of family issues that is potentially affecting her health.  She was in tears for a few minutes.  I advised the patient that she needed to seek counseling/therapy.  She did tell me that she reached out to the therapist yesterday who hopefully will get back to her today.  She also tearfully told me that she started taking 50,000 units of vitamin D once weekly.  Explained to the patient this was due to vitamin D deficiency.  At the end of our conversation she thanked me and I also let the patient know that if she is not successful to secure an appointment with that counselor we can refer her to our [place] [facility].

## 2021-08-25 NOTE — Telephone Encounter (Signed)
Spoke with patient, see chart.    

## 2021-08-25 NOTE — Telephone Encounter (Signed)
Prescription sent to pharmacy.

## 2021-08-25 NOTE — Progress Notes (Signed)
Prescription sent to pharmacy.

## 2021-08-26 ENCOUNTER — Encounter: Payer: Self-pay | Admitting: Gastroenterology

## 2021-08-30 ENCOUNTER — Encounter: Payer: Self-pay | Admitting: Cardiology

## 2021-08-31 NOTE — Telephone Encounter (Signed)
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  Parsing out between 3 a vs 3b vs 3c is a histologic diagnosis requiring further Pathologist review.  Hope this is helpful.

## 2021-09-02 ENCOUNTER — Encounter: Payer: Self-pay | Admitting: Cardiology

## 2021-09-03 ENCOUNTER — Ambulatory Visit: Payer: Medicaid Other | Admitting: Cardiology

## 2021-09-09 ENCOUNTER — Other Ambulatory Visit: Payer: Self-pay | Admitting: Family

## 2021-09-17 ENCOUNTER — Telehealth: Payer: Self-pay | Admitting: Family

## 2021-09-17 NOTE — Telephone Encounter (Signed)
Patient states she is not comfortable with her current cardiologist and would like advice on who else she can see. Please advice.

## 2021-09-22 ENCOUNTER — Telehealth: Payer: Self-pay | Admitting: Cardiology

## 2021-09-22 NOTE — Telephone Encounter (Signed)
Yes that would be fine with me.

## 2021-09-22 NOTE — Telephone Encounter (Signed)
Pt is wanting to change providers to Dr. Bing Matter... is this ok with you both?

## 2021-09-22 NOTE — Telephone Encounter (Signed)
See mychart.  

## 2021-10-03 ENCOUNTER — Encounter: Payer: Self-pay | Admitting: Cardiology

## 2021-10-08 ENCOUNTER — Ambulatory Visit: Payer: Medicaid Other | Admitting: Cardiology

## 2021-10-15 ENCOUNTER — Encounter: Payer: Self-pay | Admitting: Gastroenterology

## 2021-10-15 ENCOUNTER — Other Ambulatory Visit: Payer: Self-pay | Admitting: Gastroenterology

## 2021-10-15 ENCOUNTER — Other Ambulatory Visit: Payer: Self-pay | Admitting: General Surgery

## 2021-10-15 MED ORDER — VITAMIN D 50 MCG (2000 UT) PO TABS: 2000.0000 [IU] | ORAL_TABLET | Freq: Every day | ORAL | 1 refills | Status: DC

## 2021-10-17 ENCOUNTER — Other Ambulatory Visit: Payer: Self-pay | Admitting: Gastroenterology

## 2021-11-10 ENCOUNTER — Other Ambulatory Visit: Payer: Self-pay | Admitting: Gastroenterology

## 2021-11-20 ENCOUNTER — Other Ambulatory Visit: Payer: Self-pay | Admitting: Family

## 2021-11-20 ENCOUNTER — Encounter: Payer: Self-pay | Admitting: Gastroenterology

## 2021-11-27 ENCOUNTER — Ambulatory Visit: Payer: Medicaid Other | Admitting: Cardiology

## 2021-11-27 ENCOUNTER — Encounter: Payer: Self-pay | Admitting: Cardiology

## 2021-11-27 ENCOUNTER — Other Ambulatory Visit (INDEPENDENT_AMBULATORY_CARE_PROVIDER_SITE_OTHER): Payer: Medicaid Other

## 2021-11-27 ENCOUNTER — Other Ambulatory Visit: Payer: Self-pay

## 2021-11-27 VITALS — BP 114/78 | HR 73 | Ht 66.0 in | Wt 216.0 lb

## 2021-11-27 DIAGNOSIS — E039 Hypothyroidism, unspecified: Secondary | ICD-10-CM | POA: Diagnosis not present

## 2021-11-27 DIAGNOSIS — D509 Iron deficiency anemia, unspecified: Secondary | ICD-10-CM | POA: Diagnosis not present

## 2021-11-27 DIAGNOSIS — R55 Syncope and collapse: Secondary | ICD-10-CM

## 2021-11-27 DIAGNOSIS — E559 Vitamin D deficiency, unspecified: Secondary | ICD-10-CM | POA: Diagnosis not present

## 2021-11-27 DIAGNOSIS — I493 Ventricular premature depolarization: Secondary | ICD-10-CM

## 2021-11-27 DIAGNOSIS — K900 Celiac disease: Secondary | ICD-10-CM | POA: Diagnosis not present

## 2021-11-27 LAB — FERRITIN: Ferritin: 18 ng/mL (ref 10.0–291.0)

## 2021-11-27 LAB — VITAMIN D 25 HYDROXY (VIT D DEFICIENCY, FRACTURES): VITD: 42.37 ng/mL (ref 30.00–100.00)

## 2021-11-27 LAB — VITAMIN B12: Vitamin B-12: 405 pg/mL (ref 211–911)

## 2021-11-27 NOTE — Progress Notes (Signed)
Pt has eczema flare up on both antecubital areas and hands. Attempted venipuncture x 1 stick on only able to get a partial gold top tube. Sent for b12, vit D and ferritin.  Pt will return another day for the remaining labs at no charge. ?

## 2021-11-27 NOTE — Addendum Note (Signed)
Addended by: Roxanne Mins I on: 11/27/2021 10:08 AM ? ? Modules accepted: Orders ? ?

## 2021-11-27 NOTE — Addendum Note (Signed)
Addended by: Kelle Darting A on: 11/27/2021 11:55 AM ? ? Modules accepted: Orders ? ?

## 2021-11-27 NOTE — Progress Notes (Signed)
?Cardiology Office Note:   ? ?Date:  11/27/2021  ? ?ID:  Jessica Villa, DOB [birth year], MRN [id number] ? ?PCP:  Debbrah Alar, NP  ?Cardiologist:  Jenne Campus, MD   ? ?Referring MD: Debbrah Alar, NP  ? ?Chief Complaint  ?Patient presents with  ? Results  ?  Heart monitor previously order per Dr. Harriet Masson  ? ? ?History of Present Illness:   ? ?Jessica Villa is a 41 y.o. female with past medical history significant for 2 episode of syncope.  There is an excellent description with those episodes done by Dr. Harriet Masson.  Look like the first episode was related to dehydration, she was sick with COVID at that time second episode look like it is a vasovagal.  She did wear a monitor that showed short episode of supraventricular tachycardia also some PVCs some were symptomatic.  She was put on small dose of propranolol however she was not able to tolerate it and that medication has been discontinued.  Since that time she is doing very well.  She describes only 2 episodes of skipped beats since that she profile has been discontinued overall she is fine with that.  She also had echocardiogram done which showed low normal left ventricle ejection fraction but otherwise no significant pathology.  She described to have very stressful life.  She does some children custody battle.  But she said things are getting better right now.  She also has some difficulty sleeping because she had 80 years old child.  All distinct probably contributing to her symptomatology. ? ?Past Medical History:  ?Diagnosis Date  ? Anemia   ? during pregnancy  ? Anxiety   ? Celiac disease   ? Depression   ? Eczema   ? Gallstones   ? History of ovarian cyst   ? Hx of varicella   ? Hypothyroidism   ? PONV (postoperative nausea and vomiting)   ? ? ?Past Surgical History:  ?Procedure Laterality Date  ? CESAREAN SECTION N/A 02/18/2016  ? Procedure: CESAREAN SECTION;  Surgeon: Everlene Farrier, MD;  Location: [facility];  Service: Obstetrics;  Laterality:  N/A;  ? CESAREAN SECTION WITH BILATERAL TUBAL LIGATION N/A 01/10/2019  ? Procedure: CESAREAN SECTION WITH BILATERAL TUBAL LIGATION;  Surgeon: Everlene Farrier, MD;  Location: [facility];  Service: Obstetrics;  Laterality: N/A;  Repeat ?edc 4/29 ?NKDA ?[place]  ? CHOLECYSTECTOMY N/A 08/22/2020  ? Procedure: LAPAROSCOPIC CHOLECYSTECTOMY;  Surgeon: Felicie Morn, MD;  Location: [facility];  Service: General;  Laterality: N/A;  ? WISDOM TOOTH EXTRACTION    ? in high school per patient  ? ? ?Current Medications: ?Current Meds  ?Medication Sig  ? citalopram (CELEXA) 40 MG tablet TAKE 1 TABLET BY MOUTH EVERY DAY (Patient taking differently: Take 40 mg by mouth daily.)  ? D3 50 MCG (2000 UT) TABS TAKE 1 TABLET BY MOUTH EVERY DAY (Patient taking differently: Take 1 tablet by mouth daily.)  ? dicyclomine (BENTYL) 10 MG capsule TAKE 1 CAPSULE (10 MG TOTAL) BY MOUTH EVERY 6 (SIX) HOURS AS NEEDED FOR SPASMS.  ? Multiple Vitamins-Minerals (MULTIVITAMIN WITH MINERALS) tablet Take 1 tablet by mouth daily.  ? propranolol (INDERAL) 10 MG tablet Take 1 tablet (10 mg total) by mouth at bedtime. (Patient taking differently: Take 10 mg by mouth as needed (heartburn).)  ? SYNTHROID 175 MCG tablet TAKE 1 TABLET BY MOUTH DAILY BEFORE BREAKFAST.  ? triamcinolone ointment (KENALOG) 0.1 % Apply 1 application topically 2 (two) times daily as needed (eczema).   ?  ? ?  Allergies:   Gluten meal  ? ?Social History  ? ?Socioeconomic History  ? Marital status: Married  ?  Spouse name: Not on file  ? Number of children: 2  ? Years of education: Not on file  ? Highest education level: Not on file  ?Occupational History  ? Occupation: stay at home mom  ?Tobacco Use  ? Smoking status: Never  ? Smokeless tobacco: Never  ?Vaping Use  ? Vaping Use: Never used  ?Substance and Sexual Activity  ? Alcohol use: Yes  ?  Comment: 1 cocktail or wine per month none since pregnancy  ? Drug use: No  ? Sexual activity: Yes  ?  Partners: Male  ?Other Topics Concern  ?  Not on file  ?Social History Narrative  ? 2 step children ( 6year and 228 year old) lives with pt and her husband.   ? 1 son born 2017 daughter born in 2020  ? Stay at home mom  ? Has masters degree in education leadership  ? Has worked as a Pharmacist, hospital  ? Enjoys hiking  ? Crafts, crocheting, no pets  ?   ? ?Social Determinants of Health  ? ?Financial Resource Strain: Not on file  ?Food Insecurity: Not on file  ?Transportation Needs: Not on file  ?Physical Activity: Not on file  ?Stress: Not on file  ?Social Connections: Not on file  ?  ? ?Family History: ?The patient's family history includes ADD / ADHD in her sister; Alcohol abuse in her maternal grandfather and maternal grandmother; Anxiety disorder in her brother and sister; Dementia in her father; Depression in her father, mother, and sister; Diabetes in her father and paternal grandfather; Heart disease in her paternal grandfather; Hypothyroidism in her brother, father, mother, and paternal grandmother; Irritable bowel syndrome in her sister; Migraines in her mother; Panic disorder in her brother; Skin cancer in her paternal grandfather. ?ROS:   ?Please see the history of present illness.    ?All 14 point review of systems negative except as described per history of present illness ? ?EKGs/Labs/Other Studies Reviewed:   ? ? ? ?Recent Labs: ?05/19/2021: ALT 23; BUN 18; Creatinine, Ser 0.81; Potassium 3.6; Sodium 138 ?08/10/2021: Hemoglobin 13.5; Platelets 233.0; TSH 3.18  ?Recent Lipid Panel ?   ?Component Value Date/Time  ? CHOL 153 05/30/2020 0944  ? TRIG 41 05/30/2020 0944  ? HDL 53 05/30/2020 0944  ? CHOLHDL 2.9 05/30/2020 0944  ? CHOLHDL 3 05/01/2019 1532  ? VLDL 7.0 05/01/2019 1532  ? [place] 91 05/30/2020 0944  ? [place] 91 04/21/2018 1514  ? ? ?Physical Exam:   ? ?VS:  BP 114/78 (BP Location: Right Arm, Patient Position: Sitting)   Pulse 73   Ht 5\' 6"  (1.676 m)   Wt 216 lb (98 kg)   SpO2 99%   BMI 34.86 kg/m?    ? ?Wt Readings from Last 3 Encounters:   ?11/27/21 216 lb (98 kg)  ?07/27/21 210 lb (95.3 kg)  ?06/09/21 208 lb 6 oz (94.5 kg)  ?  ? ?GEN:  Well nourished, well developed in no acute distress ?HEENT: Normal ?NECK: No JVD; No carotid bruits ?LYMPHATICS: No lymphadenopathy ?CARDIAC: RRR, no murmurs, no rubs, no gallops ?RESPIRATORY:  Clear to auscultation without rales, wheezing or rhonchi  ?ABDOMEN: Soft, non-tender, non-distended ?MUSCULOSKELETAL:  No edema; No deformity  ?SKIN: Warm and dry ?LOWER EXTREMITIES: no swelling ?NEUROLOGIC:  Alert and oriented x 3 ?PSYCHIATRIC:  Normal affect  ? ?ASSESSMENT:   ? ?1. Vasovagal syncope   ?  2. Hypothyroidism, unspecified type   ?3. Celiac disease   ?4. Ventricular ectopy   ? ?PLAN:   ? ?In order of problems listed above: ? ?Vasovagal syncope I described to have mechanism of this.  I told her she needed and plenty of fluids especially in hot months.  Also warned her if this situation will recur if she feel it coming she need to sit down and family lay down.  She need to put her head down.  I also show her counterpressure maneuvers. ?Hypothyroidism, that being followed by internal medicine team I did review K PN which show me data from November with TSH of 3.18 which is normal. ?Cholesterol status is a part of cardiac evaluation I will repeat her cholesterol I do have data from 2021 with LDL of 91 HDL 53.  That need to be repeated. ?PVCs very few less than 1% clearly.  Concerned about potentially low normal ejection fraction.  I will simply repeat her echocardiogram in May ? ? ?Medication Adjustments/Labs and Tests Ordered: ?Current medicines are reviewed at length with the patient today.  Concerns regarding medicines are outlined above.  ?No orders of the defined types were placed in this encounter. ? ?Medication changes: No orders of the defined types were placed in this encounter. ? ? ?Signed, ?Park Liter, MD, [facility] ?11/27/2021 9:56 AM    ?[place] ?

## 2021-11-27 NOTE — Patient Instructions (Signed)
Medication Instructions:  ?Your physician recommends that you continue on your current medications as directed. Please refer to the Current Medication list given to you today. ? ?*If you need a refill on your cardiac medications before your next appointment, please call your pharmacy* ? ? ?Lab Work: ?Your physician recommends that you return for lab work in:  ? ?Labs in May: Lipid ? ?If you have labs (blood work) drawn today and your tests are completely normal, you will receive your results only by: ?MyChart Message (if you have MyChart) OR ?A paper copy in the mail ?If you have any lab test that is abnormal or we need to change your treatment, we will call you to review the results. ? ? ?Testing/Procedures: ?Your physician has requested that you have an echocardiogram in May. Echocardiography is a painless test that uses sound waves to create images of your heart. It provides your doctor with information about the size and shape of your heart and how well your heart?s chambers and valves are working. This procedure takes approximately one hour. There are no restrictions for this procedure.  ? ? ?Follow-Up: ?At [facility], you and your health needs are our priority.  As part of our continuing mission to provide you with exceptional heart care, we have created designated Provider Care Teams.  These Care Teams include your primary Cardiologist (physician) and Advanced Practice Providers (APPs -  Physician Assistants and Nurse Practitioners) who all work together to provide you with the care you need, when you need it. ? ?We recommend signing up for the patient portal called "MyChart".  Sign up information is provided on this After Visit Summary.  MyChart is used to connect with patients for Virtual Visits (Telemedicine).  Patients are able to view lab/test results, encounter notes, upcoming appointments, etc.  Non-urgent messages can be sent to your provider as well.   ?To learn more about what you can do with  MyChart, go to [email or web address].   ? ?Your next appointment:   ?6 month(s) ? ?The format for your next appointment:   ?In Person ? ?Provider:   ?Gypsy Balsam, MD  ? ? ?Other Instructions ?None ? ?

## 2021-12-02 ENCOUNTER — Encounter: Payer: Self-pay | Admitting: Gastroenterology

## 2021-12-05 ENCOUNTER — Other Ambulatory Visit: Payer: Self-pay | Admitting: Gastroenterology

## 2021-12-06 ENCOUNTER — Other Ambulatory Visit: Payer: Self-pay | Admitting: Family

## 2021-12-07 ENCOUNTER — Other Ambulatory Visit (INDEPENDENT_AMBULATORY_CARE_PROVIDER_SITE_OTHER): Payer: Medicaid Other

## 2021-12-07 DIAGNOSIS — D509 Iron deficiency anemia, unspecified: Secondary | ICD-10-CM | POA: Diagnosis not present

## 2021-12-07 LAB — IBC PANEL
Iron: 70 ug/dL (ref 42–145)
Saturation Ratios: 21.4 % (ref 20.0–50.0)
TIBC: 327.6 ug/dL (ref 250.0–450.0)
Transferrin: 234 mg/dL (ref 212.0–360.0)

## 2021-12-07 LAB — CBC
HCT: 40.6 % (ref 36.0–46.0)
Hemoglobin: 13.7 g/dL (ref 12.0–15.0)
MCHC: 33.7 g/dL (ref 30.0–36.0)
MCV: 88.6 fl (ref 78.0–100.0)
Platelets: 228 10*3/uL (ref 150.0–400.0)
RBC: 4.59 Mil/uL (ref 3.87–5.11)
RDW: 12.7 % (ref 11.5–15.5)
WBC: 6.2 10*3/uL (ref 4.0–10.5)

## 2021-12-16 ENCOUNTER — Encounter: Payer: Self-pay | Admitting: Medical

## 2021-12-16 ENCOUNTER — Ambulatory Visit (INDEPENDENT_AMBULATORY_CARE_PROVIDER_SITE_OTHER): Payer: Medicaid Other | Admitting: Medical

## 2021-12-16 ENCOUNTER — Ambulatory Visit (HOSPITAL_BASED_OUTPATIENT_CLINIC_OR_DEPARTMENT_OTHER)
Admission: RE | Admit: 2021-12-16 | Discharge: 2021-12-16 | Disposition: A | Discharge status: Home / Self Care | Payer: Medicaid Other | Source: Ambulatory Visit | Attending: Medical | Admitting: Medical

## 2021-12-16 VITALS — BP 110/75 | HR 90 | Resp 20 | Ht 66.0 in | Wt 218.0 lb

## 2021-12-16 DIAGNOSIS — M791 Myalgia, unspecified site: Secondary | ICD-10-CM

## 2021-12-16 DIAGNOSIS — R0781 Pleurodynia: Secondary | ICD-10-CM | POA: Diagnosis not present

## 2021-12-16 DIAGNOSIS — R5383 Other fatigue: Secondary | ICD-10-CM | POA: Diagnosis not present

## 2021-12-16 DIAGNOSIS — R0600 Dyspnea, unspecified: Secondary | ICD-10-CM | POA: Diagnosis not present

## 2021-12-16 DIAGNOSIS — R0602 Shortness of breath: Secondary | ICD-10-CM

## 2021-12-16 DIAGNOSIS — R079 Chest pain, unspecified: Secondary | ICD-10-CM | POA: Diagnosis not present

## 2021-12-16 LAB — CBC WITH DIFFERENTIAL/PLATELET
Basophils Absolute: 0.1 10*3/uL (ref 0.0–0.1)
Basophils Relative: 0.8 % (ref 0.0–3.0)
Eosinophils Absolute: 0.2 10*3/uL (ref 0.0–0.7)
Eosinophils Relative: 2 % (ref 0.0–5.0)
HCT: 38.8 % (ref 36.0–46.0)
Hemoglobin: 13 g/dL (ref 12.0–15.0)
Lymphocytes Relative: 28.1 % (ref 12.0–46.0)
Lymphs Abs: 2.3 10*3/uL (ref 0.7–4.0)
MCHC: 33.5 g/dL (ref 30.0–36.0)
MCV: 88.4 fl (ref 78.0–100.0)
Monocytes Absolute: 0.8 10*3/uL (ref 0.1–1.0)
Monocytes Relative: 9.3 % (ref 3.0–12.0)
Neutro Abs: 4.9 10*3/uL (ref 1.4–7.7)
Neutrophils Relative %: 59.8 % (ref 43.0–77.0)
Platelets: 230 10*3/uL (ref 150.0–400.0)
RBC: 4.39 Mil/uL (ref 3.87–5.11)
RDW: 13.2 % (ref 11.5–15.5)
WBC: 8.2 10*3/uL (ref 4.0–10.5)

## 2021-12-16 LAB — COMPREHENSIVE METABOLIC PANEL
ALT: 21 U/L (ref 0–35)
AST: 29 U/L (ref 0–37)
Albumin: 4.4 g/dL (ref 3.5–5.2)
Alkaline Phosphatase: 47 U/L (ref 39–117)
BUN: 13 mg/dL (ref 6–23)
CO2: 28 mEq/L (ref 19–32)
Calcium: 9.7 mg/dL (ref 8.4–10.5)
Chloride: 103 mEq/L (ref 96–112)
Creatinine, Ser: 0.78 mg/dL (ref 0.40–1.20)
GFR: 93.23 mL/min (ref >60.00)
Glucose, Bld: 101 mg/dL — ABNORMAL HIGH (ref 70–99)
Potassium: 3.5 mEq/L (ref 3.5–5.1)
Sodium: 137 mEq/L (ref 135–145)
Total Bilirubin: 0.3 mg/dL (ref 0.2–1.2)
Total Protein: 7.4 g/dL (ref 6.0–8.3)

## 2021-12-16 LAB — TROPONIN I (HIGH SENSITIVITY): High Sens Troponin I: 3 ng/L (ref 2–17)

## 2021-12-16 LAB — D-DIMER, QUANTITATIVE: D-Dimer, Quant: 0.32 mcg/mL FEU (ref <0.50)

## 2021-12-16 MED ORDER — KETOROLAC TROMETHAMINE 60 MG/2ML IM SOLN
60.0000 mg | Freq: Once | INTRAMUSCULAR | Status: AC
  Administered 2021-12-16: 60 mg via INTRAMUSCULAR

## 2021-12-16 NOTE — Progress Notes (Addendum)
? ?Subjective:  ? ? Patient ID: Jessica Villa, female    DOB: 05-Jul-1981, 41 y.o.   MRN: [id number] ? ?HPI ? ? ?Pt in for some left upper back pain which she thinks border of scapula. But on exam she indicates more close to upper left side thoracic region. Pt was hold her 41 year old and camping chairs while walking to car after baseball game. Pain when sits up straight, when lies down and when deep breaths.  ? ?Pt never smoked. No ocp use. Pt has had tubal ligation. No pain. No left should pain, no jaw pain, no sweating and no chest pain. ? ? ?Pt had 1 syncope episodes in past. And 1 near syncope Went to ED. Had d dimer and troponin test in past sept(both were negative). . Pt went to cardiologist office and had echo and holter.  ? ? ?Pt 11-27-2021 had evaluation. ? ? ?" ?Vasovagal syncope I described to have mechanism of this.  I told her she needed and plenty of fluids especially in hot months.  Also warned her if this situation will recur if she feel it coming she need to sit down and family lay down.  She need to put her head down.  I also show her counterpressure maneuvers. ?Hypothyroidism, that being followed by internal medicine team I did review K PN which show me data from November with TSH of 3.18 which is normal. ?Cholesterol status is a part of cardiac evaluation I will repeat her cholesterol I do have data from 2021 with LDL of 91 HDL 53.  That need to be repeated. ?PVCs very few less than 1% clearly.  Concerned about potentially low normal ejection fraction.  I will simply repeat her echocardiogram in May" ? ?Earlier today she felt minimal shortness of breath with medial scapula. ? ?Pt describes feeling like she can't take full deep breath. No hx of asthma.  ? ? ? ?Review of Systems  ?Constitutional:  Negative for diaphoresis and fever.  ?HENT:  Negative for congestion, drooling, hearing loss, nosebleeds, postnasal drip and sore throat.   ?Respiratory:  Positive for shortness of breath. Negative for cough  and wheezing.   ?Cardiovascular:  Negative for chest pain and palpitations.  ?Gastrointestinal:  Negative for abdominal pain, blood in stool, diarrhea and nausea.  ?Musculoskeletal:  Positive for back pain.  ?     See  hpi.  ?Skin:  Negative for pallor and rash.  ?Neurological:  Negative for dizziness, syncope, weakness, light-headedness, numbness and headaches.  ?Hematological:  Negative for adenopathy. Does not bruise/bleed easily.  ?Psychiatric/Behavioral:  Negative for behavioral problems and confusion.   ? ? ?Past Medical History:  ?Diagnosis Date  ? Anemia   ? during pregnancy  ? Anxiety   ? Celiac disease   ? Depression   ? Eczema   ? Gallstones   ? History of ovarian cyst   ? Hx of varicella   ? Hypothyroidism   ? PONV (postoperative nausea and vomiting)   ? ?  ?Social History  ? ?Socioeconomic History  ? Marital status: Married  ?  Spouse name: Not on file  ? Number of children: 2  ? Years of education: Not on file  ? Highest education level: Not on file  ?Occupational History  ? Occupation: stay at home mom  ?Tobacco Use  ? Smoking status: Never  ? Smokeless tobacco: Never  ?Vaping Use  ? Vaping Use: Never used  ?Substance and Sexual Activity  ? Alcohol use: Yes  ?  Comment: 1 cocktail or wine per month none since pregnancy  ? Drug use: No  ? Sexual activity: Yes  ?  Partners: Male  ?Other Topics Concern  ? Not on file  ?Social History Narrative  ? 2 step children ( 6year and 43 year old) lives with pt and her husband.   ? 1 son born 2017 daughter born in 2020  ? Stay at home mom  ? Has masters degree in education leadership  ? Has worked as a Pharmacist, hospital  ? Enjoys hiking  ? Crafts, crocheting, no pets  ?   ? ?Social Determinants of Health  ? ?Financial Resource Strain: Not on file  ?Food Insecurity: Not on file  ?Transportation Needs: Not on file  ?Physical Activity: Not on file  ?Stress: Not on file  ?Social Connections: Not on file  ?Intimate Partner Violence: Not on file  ? ? ?Past Surgical History:   ?Procedure Laterality Date  ? CESAREAN SECTION N/A 02/18/2016  ? Procedure: CESAREAN SECTION;  Surgeon: Everlene Farrier, MD;  Location: [place];  Service: Obstetrics;  Laterality: N/A;  ? CESAREAN SECTION WITH BILATERAL TUBAL LIGATION N/A 01/10/2019  ? Procedure: CESAREAN SECTION WITH BILATERAL TUBAL LIGATION;  Surgeon: Everlene Farrier, MD;  Location: [place] [facility];  Service: Obstetrics;  Laterality: N/A;  Repeat ?edc 4/29 ?NKDA ?[place]  ? CHOLECYSTECTOMY N/A 08/22/2020  ? Procedure: LAPAROSCOPIC CHOLECYSTECTOMY;  Surgeon: Felicie Morn, MD;  Location: [place];  Service: General;  Laterality: N/A;  ? WISDOM TOOTH EXTRACTION    ? in high school per patient  ? ? ?Family History  ?Problem Relation Age of Onset  ? Depression Mother   ? Migraines Mother   ? Hypothyroidism Mother   ? Dementia Father   ? Diabetes Father   ? Depression Father   ? Hypothyroidism Father   ? Alcohol abuse Maternal Grandmother   ? Alcohol abuse Maternal Grandfather   ? Hypothyroidism Paternal Grandmother   ? Heart disease Paternal Grandfather   ? Diabetes Paternal Grandfather   ? Skin cancer Paternal Grandfather   ? Depression Sister   ? ADD / ADHD Sister   ? Irritable bowel syndrome Sister   ? Anxiety disorder Sister   ? Panic disorder Brother   ? Hypothyroidism Brother   ? Anxiety disorder Brother   ? ? ?Allergies  ?Allergen Reactions  ? Gluten Meal   ?  Celiac  ? ? ?Current Outpatient Medications on File Prior to Visit  ?Medication Sig Dispense Refill  ? citalopram (CELEXA) 40 MG tablet TAKE 1 TABLET BY MOUTH EVERY DAY (Patient taking differently: Take 40 mg by mouth daily.) 90 tablet 0  ? D3 50 MCG (2000 UT) TABS TAKE 1 TABLET BY MOUTH EVERY DAY (Patient taking differently: Take 1 tablet by mouth daily.) 30 tablet 1  ? dicyclomine (BENTYL) 10 MG capsule TAKE 1 CAPSULE (10 MG TOTAL) BY MOUTH EVERY 6 (SIX) HOURS AS NEEDED FOR SPASMS. 90 capsule 0  ? Multiple Vitamins-Minerals (MULTIVITAMIN WITH MINERALS) tablet Take 1 tablet by  mouth daily.    ? propranolol (INDERAL) 10 MG tablet Take 1 tablet (10 mg total) by mouth at bedtime. (Patient taking differently: Take 10 mg by mouth as needed (heartburn).) 90 tablet 3  ? SYNTHROID 175 MCG tablet TAKE 1 TABLET BY MOUTH EVERY DAY BEFORE BREAKFAST 90 tablet 0  ? triamcinolone ointment (KENALOG) 0.1 % Apply 1 application topically 2 (two) times daily as needed (eczema).     ? ?No current  facility-administered medications on file prior to visit.  ? ? ?BP 110/75   Pulse 90   Resp 20   Ht 5\' 6"  (1.676 m)   Wt 218 lb (98.9 kg)   SpO2 99%   BMI 35.19 kg/m?  ? Pulse stayed below 100 on ambulation ?   ?Objective:  ? Physical Exam ? ?General ?Mental Status- Alert. General Appearance- Not in acute distress.  ? ?Skin ?General: Color- Normal Color. Moisture- Normal Moisture. ? ?Neck ?Carotid Arteries- Normal color. Moisture- Normal Moisture. No carotid bruits. No JVD. No tracheal deviation. ? ?Chest and Lung Exam ?Auscultation: ?Breath Sounds:-Normal. Even unlabored. Ambulating 02 sat was 98-99%.  ? ?Cardiovascular ?Auscultation:Rythm- Regular. ?Murmurs & Other Heart Sounds:Auscultation of the heart reveals- No Murmurs. ? ?Abdomen ?Inspection:-Inspeection Normal. ?Palpation/Percussion:Note:No mass. Palpation and Percussion of the abdomen reveal- Non Tender, Non Distended + BS, no rebound or guarding. ? ? ?Neurologic ?Cranial Nerve exam:- CN III-XII intact(No nystagmus), symmetric smile. ?Strength:- 5/5 equal and symmetric strength both upper and lower extremities.  ? ?Lower extremity-bilateral symmetric calves with no pedal edema.  Negative Homans' sign bilaterally.  No popliteal pain on palpation.  No fullness on palpation of either popliteal fossa. ?   ?Assessment & Plan:  ?[phone]. ? ?Patient Instructions  ?1 day left upper back pleuritic type pain vs back muscle pain.  Describes some pain on deep inspiration.  On exam pain localized to left upper back in region between  lt scapula and upper  thoracic spine.  1 point tender area on palpation found adjacent to to thoracic spine.  Pain in this area on deep inspiration as well.  No rash seen in this area. ? ? ?Will get chest x-ray, D-dimer and troponin stat.

## 2021-12-16 NOTE — Patient Instructions (Addendum)
1 day left upper back pleuritic type pain vs back muscle pain.  Describes some pain on deep inspiration.  On exam pain localized to left upper back in region between  lt scapula and upper thoracic spine.  1 point tender area on palpation found adjacent to to thoracic spine.  Pain in this area on deep inspiration as well.  No rash seen in this area. ? ? ?Will get chest x-ray, D-dimer and troponin stat.  If D-dimer elevated or troponin elevated would advise emergency department evaluation.  We will contact you either by MyChart message or phone call. ? ?Ekg nsr.rate 80.  ? ?Presently giving Toradol 60 mg IM to see if described pain in left upper back resolved with NSAID. ? ?Described fatigue recently and will get CBC and metabolic panel.  Considered other labs for fatigue but found that those were done recently earlier this month. ? ?Keep follow-up appointment with cardiologist.  If you have any chest pain recommend emergency department evaluation. ? ?Follow-up date to be determined after lab and imaging review. ?

## 2021-12-16 NOTE — Addendum Note (Signed)
Addended by: Maximino [name] on: 12/16/2021 02:56 PM ? ? Modules accepted: Orders ? ?

## 2021-12-17 MED ORDER — DICLOFENAC SODIUM 75 MG PO TBEC: 75.0000 mg | DELAYED_RELEASE_TABLET | Freq: Two times a day (BID) | ORAL | 0 refills | Status: DC

## 2021-12-17 NOTE — Addendum Note (Signed)
Addended by: Gwenevere Abbot on: 12/17/2021 10:36 PM ? ? Modules accepted: Orders ? ?

## 2022-01-18 ENCOUNTER — Other Ambulatory Visit (HOSPITAL_BASED_OUTPATIENT_CLINIC_OR_DEPARTMENT_OTHER): Payer: Medicaid Other

## 2022-02-01 ENCOUNTER — Ambulatory Visit (HOSPITAL_BASED_OUTPATIENT_CLINIC_OR_DEPARTMENT_OTHER)
Admission: RE | Admit: 2022-02-01 | Discharge: 2022-02-01 | Disposition: A | Discharge status: Home / Self Care | Payer: Medicaid Other | Source: Ambulatory Visit | Attending: Cardiology | Admitting: Cardiology

## 2022-02-01 DIAGNOSIS — R55 Syncope and collapse: Secondary | ICD-10-CM | POA: Diagnosis not present

## 2022-02-01 DIAGNOSIS — I493 Ventricular premature depolarization: Secondary | ICD-10-CM | POA: Diagnosis present

## 2022-02-01 DIAGNOSIS — E039 Hypothyroidism, unspecified: Secondary | ICD-10-CM | POA: Diagnosis present

## 2022-02-01 DIAGNOSIS — K900 Celiac disease: Secondary | ICD-10-CM | POA: Insufficient documentation

## 2022-02-01 DIAGNOSIS — R0609 Other forms of dyspnea: Secondary | ICD-10-CM | POA: Diagnosis not present

## 2022-02-01 LAB — ECHOCARDIOGRAM COMPLETE
AR max vel: 1.58 cm2
AV Area VTI: 1.48 cm2
AV Area mean vel: 1.55 cm2
AV Mean grad: 6 mmHg
AV Peak grad: 12.5 mmHg
Ao pk vel: 1.77 m/s
Area-P 1/2: 5.58 cm2
S' Lateral: 3.3 cm

## 2022-02-01 NOTE — Progress Notes (Signed)
?  Echocardiogram ?2D Echocardiogram has been performed. ? ?Elmer Ramp ?02/01/2022, 10:35 AM ?

## 2022-02-03 ENCOUNTER — Encounter: Payer: Self-pay | Admitting: Family

## 2022-02-04 ENCOUNTER — Telehealth: Payer: Self-pay | Admitting: Cardiology

## 2022-02-04 NOTE — Telephone Encounter (Signed)
Pt returning call from nurse regarding ECHO results. Please advise

## 2022-02-04 NOTE — Telephone Encounter (Signed)
Results reviewed with pt as per Dr. Krasowski's note.  Pt verbalized understanding and had no additional questions. Routed to PCP  

## 2022-02-07 ENCOUNTER — Encounter: Payer: Self-pay | Admitting: Family

## 2022-02-08 MED ORDER — CITALOPRAM HYDROBROMIDE 10 MG PO TABS: 10.0000 mg | ORAL_TABLET | Freq: Every day | ORAL | 0 refills | Status: DC

## 2022-02-09 MED ORDER — CITALOPRAM HYDROBROMIDE 40 MG PO TABS: 40.0000 mg | ORAL_TABLET | Freq: Every day | ORAL | 3 refills | Status: DC

## 2022-02-09 NOTE — Telephone Encounter (Signed)
Can you please cancel citalopram 10mg  rx at her pharmacy that I sent earlier?

## 2022-02-09 NOTE — Telephone Encounter (Signed)
Rx cancelled at pharmacy. 

## 2022-02-18 ENCOUNTER — Other Ambulatory Visit: Payer: Self-pay | Admitting: Family

## 2022-03-06 ENCOUNTER — Encounter: Payer: Self-pay | Admitting: Family

## 2022-03-06 ENCOUNTER — Other Ambulatory Visit: Payer: Self-pay | Admitting: Family

## 2022-03-07 ENCOUNTER — Other Ambulatory Visit: Payer: Self-pay | Admitting: Family

## 2022-03-09 ENCOUNTER — Encounter: Payer: Self-pay | Admitting: Gastroenterology

## 2022-03-10 ENCOUNTER — Ambulatory Visit: Payer: Medicaid Other | Admitting: Family

## 2022-03-10 ENCOUNTER — Other Ambulatory Visit: Payer: Self-pay

## 2022-03-10 VITALS — BP 97/62 | HR 79 | Temp 98.7°F | Resp 16 | Wt 223.0 lb

## 2022-03-10 DIAGNOSIS — E039 Hypothyroidism, unspecified: Secondary | ICD-10-CM | POA: Diagnosis not present

## 2022-03-10 DIAGNOSIS — K219 Gastro-esophageal reflux disease without esophagitis: Secondary | ICD-10-CM

## 2022-03-10 DIAGNOSIS — F419 Anxiety disorder, unspecified: Secondary | ICD-10-CM | POA: Diagnosis not present

## 2022-03-10 DIAGNOSIS — F32A Depression, unspecified: Secondary | ICD-10-CM

## 2022-03-10 DIAGNOSIS — Z1231 Encounter for screening mammogram for malignant neoplasm of breast: Secondary | ICD-10-CM

## 2022-03-10 DIAGNOSIS — K900 Celiac disease: Secondary | ICD-10-CM

## 2022-03-10 LAB — TSH: TSH: 1.24 u[IU]/mL (ref 0.35–5.50)

## 2022-03-10 MED ORDER — CITALOPRAM HYDROBROMIDE 40 MG PO TABS: 40.0000 mg | ORAL_TABLET | Freq: Every day | ORAL | 1 refills | Status: DC

## 2022-03-10 NOTE — Telephone Encounter (Signed)
If doing well clinically, can check TTG IgG and TTG IgA to evaluate for serologic response.  Can schedule follow-up in the GI clinic in August/September (1 year follow-up).  Otherwise recent CBC and iron panel demonstrated excellent response.

## 2022-03-10 NOTE — Assessment & Plan Note (Signed)
Lab Results  Component Value Date   TSH 3.18 08/10/2021   Stable Will obtain follow up tsh, continue synthroid.

## 2022-03-10 NOTE — Assessment & Plan Note (Addendum)
Stable on pantoprozole prn.  Continue same.

## 2022-03-10 NOTE — Progress Notes (Signed)
Subjective:   By signing my name below, I, Jessica Villa, attest that this documentation has been prepared under the direction and in the presence of Debbrah Alar NP, 03/10/2022   Patient ID: Jessica Villa, female    DOB: 15-Sep-1981, 41 y.o.   MRN: [id number]  Chief Complaint  Patient presents with   Depression    "Doing well on Celexa"   Muscle Pain    Complains of muscle aches, left shoulder   Gastroesophageal Reflux    Takes pantoprazole     HPI Patient is in today for an office visit.  Refill: She is requesting a refill of 40 Mg of Citalopram.  Muscle Pain: She reports that she went to a specialist on 12/16/2021 for left shoulder pain with associated symptoms of SOB. She reports that she also developed left knee pain after her office visit. Since then, her shoulder pain is now improving. She notes that her kids sleep on the left side of her body and she is not regularly exercising.  Acidic Stomach: She is currently taking 20 Mg of Protonix. She reports that she previously suffered from an acidic stomach which would appear if she would let her hunger go on for too long. She previously took 10 Mg of Bentyl which she reports did not improve her symptoms. However, she states that since taking Protonix, her symptoms are resolved. Mood: She is currently taking 40 Mg of Citalopram. She states that when she was taking 20 Mg of Citalopram, her symptoms worsened.  Thyroid: She is currently taking 175 MCG of Synthroid. She reports that she just received a refill of the medication.  Lab Results  Component Value Date   TSH 3.18 08/10/2021   Pap Smear: Last completed on 04/23/2019. She is due for her pap smear on 04/22/2022. She does not regularly follow up with a gynecologist.  Mammogram: She has not received a mammogram.   Health Maintenance Due  Topic Date Due   COVID-19 Vaccine (3 - Pfizer series) 04/16/2020   PAP SMEAR-Modifier  04/22/2022    Past Medical History:  Diagnosis  Date   Anemia    during pregnancy   Anxiety    Celiac disease    Depression    Eczema    Gallstones    History of ovarian cyst    Hx of varicella    Hypothyroidism    PONV (postoperative nausea and vomiting)     Past Surgical History:  Procedure Laterality Date   CESAREAN SECTION N/A 02/18/2016   Procedure: CESAREAN SECTION;  Surgeon: Everlene Farrier, MD;  Location: [place];  Service: Obstetrics;  Laterality: N/A;   CESAREAN SECTION WITH BILATERAL TUBAL LIGATION N/A 01/10/2019   Procedure: CESAREAN SECTION WITH BILATERAL TUBAL LIGATION;  Surgeon: Everlene Farrier, MD;  Location: [place] [facility];  Service: Obstetrics;  Laterality: N/A;  Repeat edc 4/29 NKDA [place] RNFA   CHOLECYSTECTOMY N/A 08/22/2020   Procedure: LAPAROSCOPIC CHOLECYSTECTOMY;  Surgeon: Felicie Morn, MD;  Location: [facility];  Service: General;  Laterality: N/A;   WISDOM TOOTH EXTRACTION     in high school per patient    Family History  Problem Relation Age of Onset   Depression Mother    Migraines Mother    Hypothyroidism Mother    Dementia Father    Diabetes Father    Depression Father    Hypothyroidism Father    Alcohol abuse Maternal Grandmother    Alcohol abuse Maternal Grandfather    Hypothyroidism Paternal Grandmother  Heart disease Paternal Grandfather    Diabetes Paternal Grandfather    Skin cancer Paternal Grandfather    Depression Sister    ADD / ADHD Sister    Irritable bowel syndrome Sister    Anxiety disorder Sister    Panic disorder Brother    Hypothyroidism Brother    Anxiety disorder Brother     Social History   Socioeconomic History   Marital status: Married    Spouse name: Not on file   Number of children: 2   Years of education: Not on file   Highest education level: Not on file  Occupational History   Occupation: stay at home mom  Tobacco Use   Smoking status: Never   Smokeless tobacco: Never  Vaping Use   Vaping Use: Never used  Substance and Sexual Activity    Alcohol use: Yes    Comment: 1 cocktail or wine per month none since pregnancy   Drug use: No   Sexual activity: Yes    Partners: Male  Other Topics Concern   Not on file  Social History Narrative   2 step children ( 6year and 973 year old) lives with pt and her husband.    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   Has masters degree in education leadership   Has worked as a Dance movement psychotherapist, crocheting, no pets      Social Determinants of Health   Financial Resource Strain: Low Risk  (12/28/2018)   Overall Financial Resource Strain (CARDIA)    Difficulty of Paying Living Expenses: Not hard at all  Food Insecurity: No Food Insecurity (12/28/2018)   Hunger Vital Sign    Worried About Running Out of Food in the Last Year: Never true    Ran Out of Food in the Last Year: Never true  Transportation Needs: Unknown (12/28/2018)   PRAPARE - Administrator, Civil Service (Medical): No    Lack of Transportation (Non-Medical): Not on file  Physical Activity: Not on file  Stress: No Stress Concern Present (12/28/2018)   [employer] of Occupational Health - Occupational Stress Questionnaire    Feeling of Stress : Only a little  Social Connections: Not on file  Intimate Partner Violence: Not At Risk (12/28/2018)   Humiliation, Afraid, Rape, and Kick questionnaire    Fear of Current or Ex-Partner: No    Emotionally Abused: No    Physically Abused: No    Sexually Abused: No    Outpatient Medications Prior to Visit  Medication Sig Dispense Refill   D3 50 MCG (2000 UT) TABS TAKE 1 TABLET BY MOUTH EVERY DAY (Patient taking differently: Take 1 tablet by mouth daily.) 30 tablet 1   dicyclomine (BENTYL) 10 MG capsule TAKE 1 CAPSULE (10 MG TOTAL) BY MOUTH EVERY 6 (SIX) HOURS AS NEEDED FOR SPASMS. 90 capsule 0   Multiple Vitamins-Minerals (MULTIVITAMIN WITH MINERALS) tablet Take 1 tablet by mouth daily.     pantoprazole (PROTONIX) 20 MG tablet Take  20 mg by mouth daily.     SYNTHROID 175 MCG tablet TAKE 1 TABLET BY MOUTH EVERY DAY BEFORE BREAKFAST 30 tablet 0   triamcinolone ointment (KENALOG) 0.1 % Apply 1 application topically 2 (two) times daily as needed (eczema).      citalopram (CELEXA) 40 MG tablet TAKE 1 TABLET BY MOUTH EVERY DAY 30 tablet 0   diclofenac (VOLTAREN) 75 MG EC tablet Take 1 tablet (  75 mg total) by mouth 2 (two) times daily. 20 tablet 0   propranolol (INDERAL) 10 MG tablet Take 1 tablet (10 mg total) by mouth at bedtime. (Patient taking differently: Take 10 mg by mouth as needed (heartburn).) 90 tablet 3   No facility-administered medications prior to visit.    Allergies  Allergen Reactions   Gluten Meal     Celiac    ROS See HPI    Objective:    Physical Exam Constitutional:      General: She is not in acute distress.    Appearance: Normal appearance. She is not ill-appearing.  HENT:     Head: Normocephalic and atraumatic.     Right Ear: External ear normal.     Left Ear: External ear normal.  Eyes:     Extraocular Movements: Extraocular movements intact.     Pupils: Pupils are equal, round, and reactive to light.  Cardiovascular:     Rate and Rhythm: Normal rate and regular rhythm.     Heart sounds: Normal heart sounds. No murmur heard.    No gallop.  Pulmonary:     Effort: Pulmonary effort is normal. No respiratory distress.     Breath sounds: Normal breath sounds. No wheezing or rales.  Skin:    General: Skin is warm and dry.  Neurological:     Mental Status: She is alert and oriented to person, place, and time.  Psychiatric:        Mood and Affect: Mood normal.        Behavior: Behavior normal.        Judgment: Judgment normal.     BP 97/62 (BP Location: Left Arm, Patient Position: Sitting, Cuff Size: Large)   Pulse 79   Temp 98.7 F (37.1 C) (Oral)   Resp 16   Wt 223 lb (101.2 kg)   SpO2 100%   BMI 35.99 kg/m  Wt Readings from Last 3 Encounters:  03/10/22 223 lb (101.2 kg)   12/16/21 218 lb (98.9 kg)  11/27/21 216 lb (98 kg)       Assessment & Plan:   Problem List Items Addressed This Visit       Unprioritized   Hypothyroidism    Lab Results  Component Value Date   TSH 3.18 08/10/2021  Stable Will obtain follow up tsh, continue synthroid.       Relevant Orders   TSH   GERD (gastroesophageal reflux disease)    Stable on pantoprozole prn.  Continue same.       Relevant Medications   pantoprazole (PROTONIX) 20 MG tablet   Anxiety and depression    Stable on citalopram. Continue same       Relevant Medications   citalopram (CELEXA) 40 MG tablet   Other Visit Diagnoses     Encounter for screening mammogram for malignant neoplasm of breast    -  Primary   Relevant Orders   MM 3D SCREEN BREAST BILATERAL       Meds ordered this encounter  Medications   citalopram (CELEXA) 40 MG tablet    Sig: Take 1 tablet (40 mg total) by mouth daily.    Dispense:  90 tablet    Refill:  1    Order Specific Question:   Supervising Provider    Answer:   Penni Homans A [[id number]]    I, Nance Pear, NP, personally preformed the services described in this documentation.  All medical record entries made by the scribe were at my  direction and in my presence.  I have reviewed the chart and discharge instructions (if applicable) and agree that the record reflects my personal performance and is accurate and complete. 03/10/2022   I,Amber Collins,acting as a Neurosurgeon for Lemont Fillers, NP.,have documented all relevant documentation on the behalf of Lemont Fillers, NP,as directed by  Lemont Fillers, NP while in the presence of Lemont Fillers, NP.   Lemont Fillers, NP

## 2022-03-10 NOTE — Assessment & Plan Note (Signed)
Stable on citalopram. Continue same.  

## 2022-03-16 ENCOUNTER — Encounter (HOSPITAL_BASED_OUTPATIENT_CLINIC_OR_DEPARTMENT_OTHER): Payer: Self-pay

## 2022-03-16 ENCOUNTER — Ambulatory Visit (HOSPITAL_BASED_OUTPATIENT_CLINIC_OR_DEPARTMENT_OTHER)
Admission: RE | Admit: 2022-03-16 | Discharge: 2022-03-16 | Disposition: A | Discharge status: Home / Self Care | Payer: Medicaid Other | Source: Ambulatory Visit | Attending: Family | Admitting: Family

## 2022-03-16 DIAGNOSIS — Z1231 Encounter for screening mammogram for malignant neoplasm of breast: Secondary | ICD-10-CM | POA: Diagnosis not present

## 2022-03-17 ENCOUNTER — Other Ambulatory Visit: Payer: Self-pay | Admitting: Family

## 2022-03-17 DIAGNOSIS — R928 Other abnormal and inconclusive findings on diagnostic imaging of breast: Secondary | ICD-10-CM

## 2022-03-24 IMAGING — CT CT ABD-PELV W/O CM
2 of 4 series · 16 of 46 positions shown, 18 images · non-contrast
Comparison: None.

CLINICAL DATA: Epigastric pain.

EXAM:
CT ABDOMEN AND PELVIS WITHOUT CONTRAST
TECHNIQUE: Multidetector CT imaging of the abdomen and pelvis was performed
following the standard protocol without IV contrast.

[Series 2: axial st · axial · 0.94mm/px · z∈[+49,+489]mm · 13 of 96 slices shown, 15 images]
[im 4/96  soft-tissue]
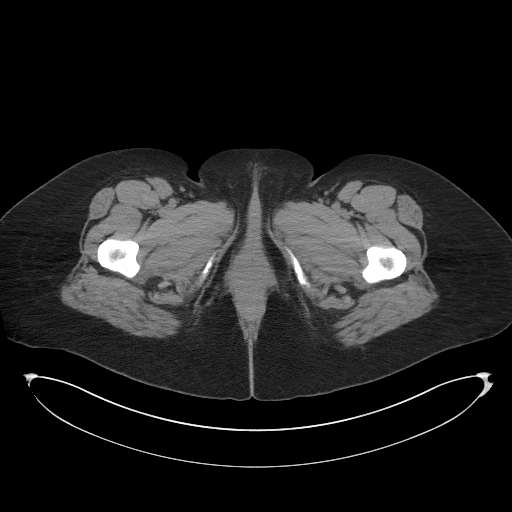
[im 4/96  bone]
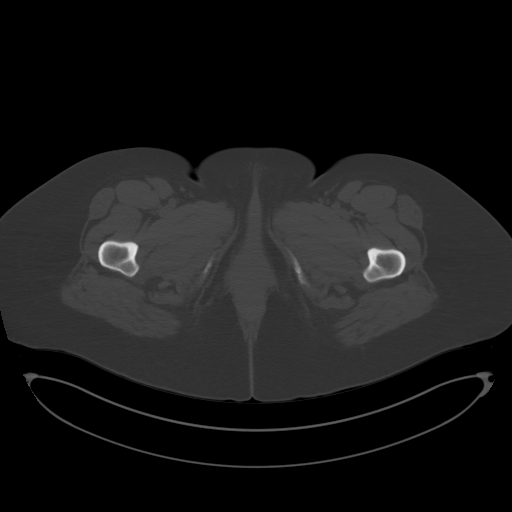
[im 12/96  soft-tissue]
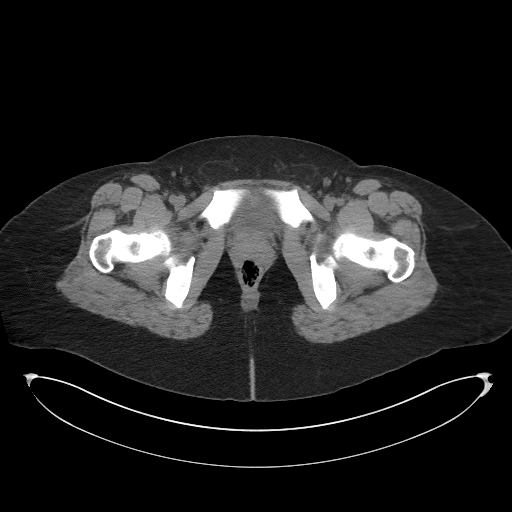
[im 20/96  soft-tissue]
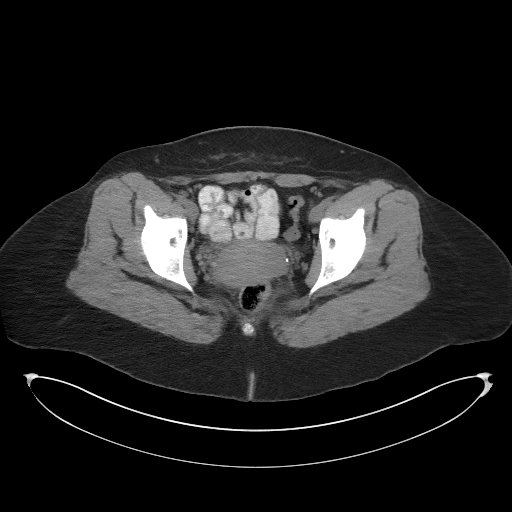
[im 27/96  soft-tissue]
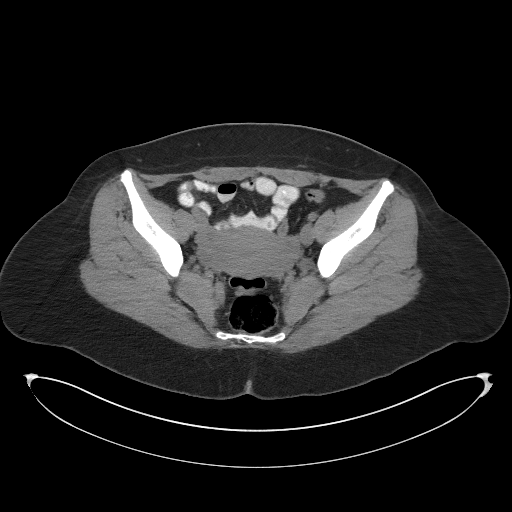
[im 35/96  soft-tissue]
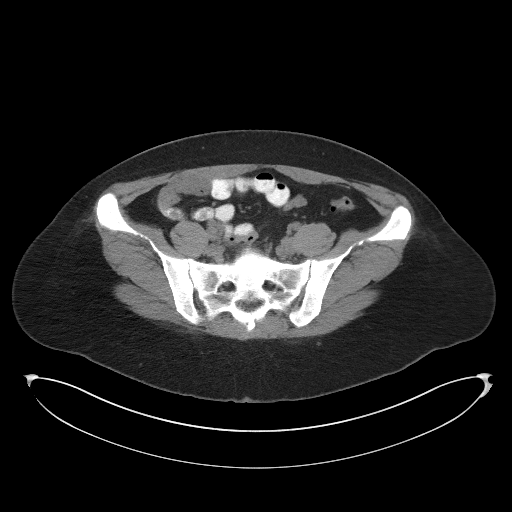
[im 42/96  soft-tissue]
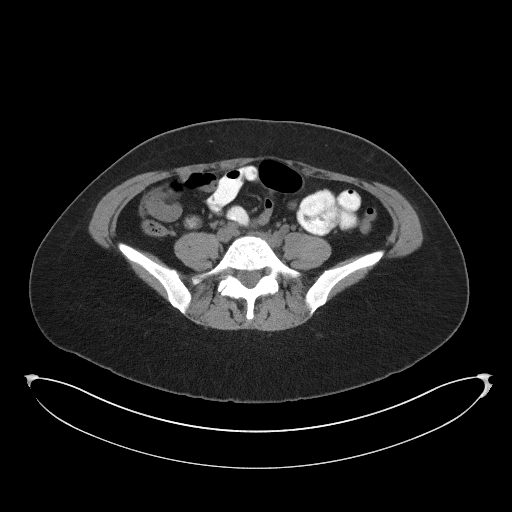
[im 50/96  soft-tissue]
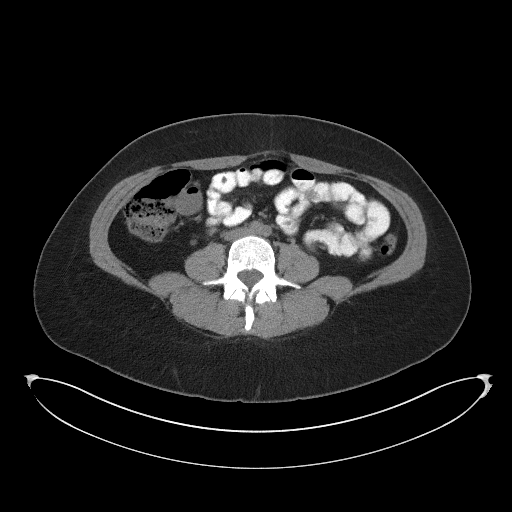
[im 54/96  soft-tissue]
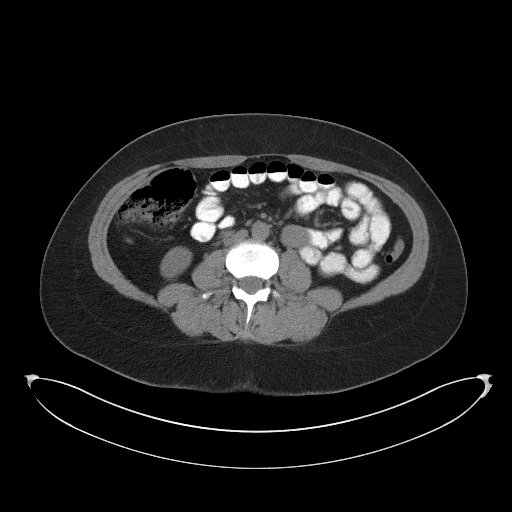
[im 61/96  soft-tissue]
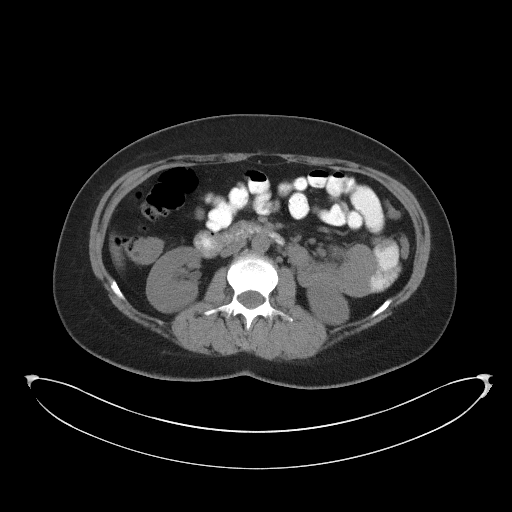
[im 61/96  bone]
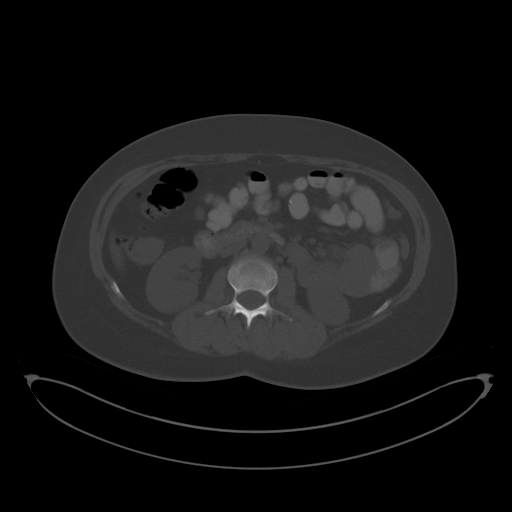
[im 69/96  soft-tissue]
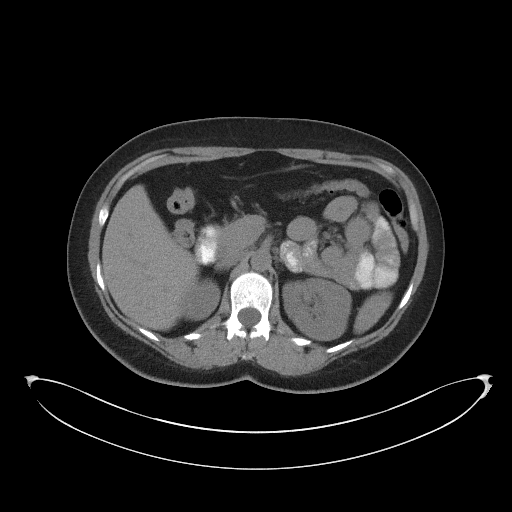
[im 77/96  soft-tissue]
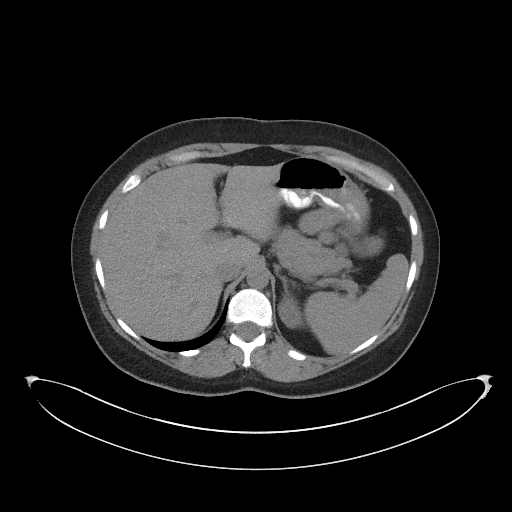
[im 84/96  soft-tissue]
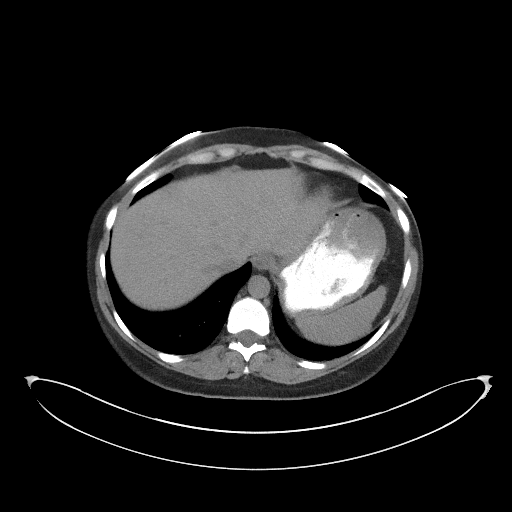
[im 92/96  soft-tissue]
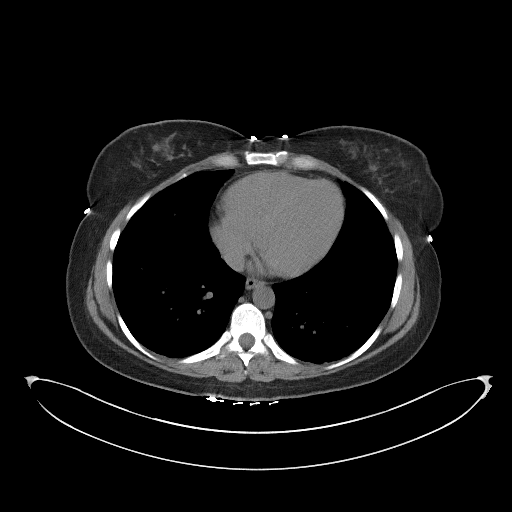

[Series 5: coronal st · coronal · 0.84mm/px · 3 of 94 slices shown]
[im 32/94  soft-tissue]
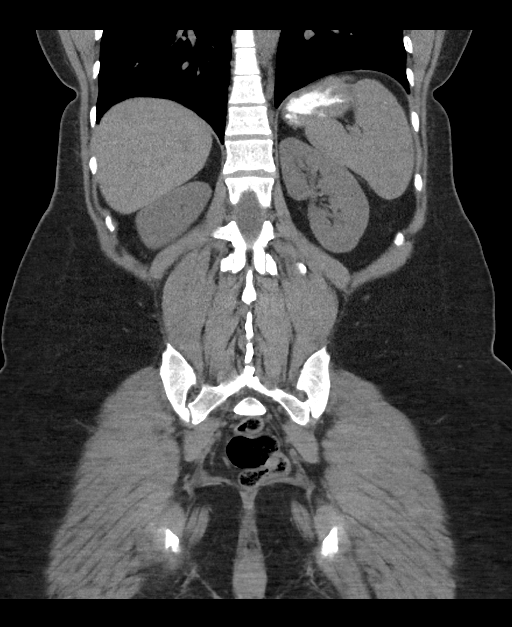
[im 42/94  soft-tissue]
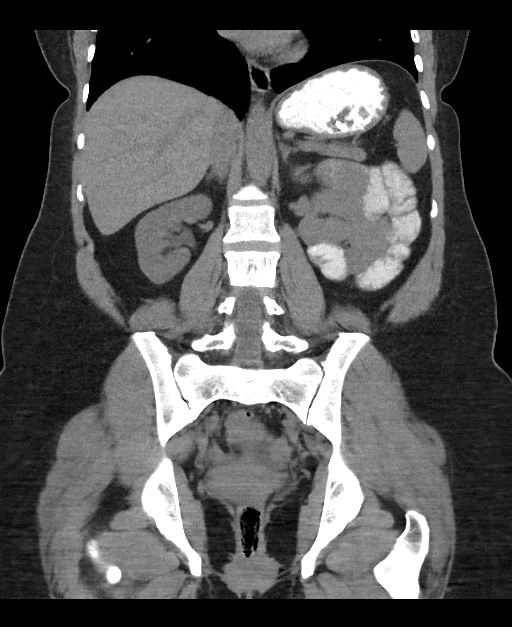
[im 52/94  soft-tissue]
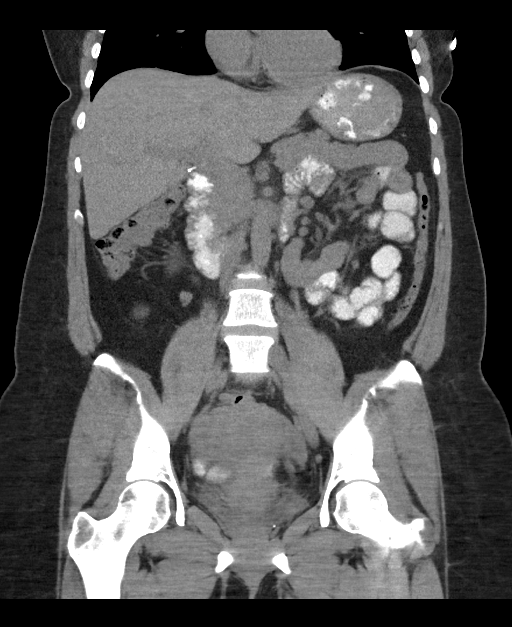

[16 of 46 positions shown; findings below may reference images not displayed]

FINDINGS: Lower chest: No acute abnormality.

Hepatobiliary: No focal liver abnormality is seen. Status post
cholecystectomy. No biliary dilatation.

Pancreas: Unremarkable. No pancreatic ductal dilatation or
surrounding inflammatory changes.

Spleen: Normal in size without focal abnormality.

Adrenals/Urinary Tract: Adrenal glands are unremarkable. Kidneys are
normal, without renal calculi, focal lesion, or hydronephrosis.
Bladder is unremarkable.

Stomach/Bowel: Stomach is within normal limits. Appendix appears
normal. No evidence of bowel wall thickening, distention, or
inflammatory changes.

Vascular/Lymphatic: No significant vascular findings are present. No
enlarged abdominal or pelvic lymph nodes.

Reproductive: Uterus and bilateral adnexa are unremarkable.

Other: No abdominal wall hernia or abnormality. No abdominopelvic
ascites.

Musculoskeletal: No acute or significant osseous findings.
IMPRESSION: No evidence of acute abnormalities within the abdomen or pelvis.

Normal appearance of the cholecystectomy bed.

## 2022-03-26 ENCOUNTER — Ambulatory Visit
Admission: RE | Admit: 2022-03-26 | Discharge: 2022-03-26 | Disposition: A | Discharge status: Home / Self Care | Payer: Medicaid Other | Source: Ambulatory Visit | Attending: Family | Admitting: Family

## 2022-03-26 DIAGNOSIS — R928 Other abnormal and inconclusive findings on diagnostic imaging of breast: Secondary | ICD-10-CM

## 2022-03-26 DIAGNOSIS — N6489 Other specified disorders of breast: Secondary | ICD-10-CM | POA: Diagnosis not present

## 2022-03-26 DIAGNOSIS — R922 Inconclusive mammogram: Secondary | ICD-10-CM | POA: Diagnosis not present

## 2022-04-01 ENCOUNTER — Ambulatory Visit: Payer: Medicaid Other | Admitting: Family

## 2022-04-01 VITALS — BP 100/65 | HR 83 | Temp 98.0°F | Resp 16 | Ht 66.0 in | Wt 222.0 lb

## 2022-04-01 DIAGNOSIS — M25571 Pain in right ankle and joints of right foot: Secondary | ICD-10-CM | POA: Diagnosis not present

## 2022-04-01 MED ORDER — MELOXICAM 15 MG PO TABS: 15.0000 mg | ORAL_TABLET | Freq: Every day | ORAL | 0 refills | Status: DC

## 2022-04-01 NOTE — Progress Notes (Signed)
Jessica Villa is a 41 y.o. female with the following history as recorded in Epic[employer]:  Patient Active Problem List   Diagnosis Date Noted   GERD (gastroesophageal reflux disease) 03/10/2022   Ventricular ectopy 11/27/2021   Syncope and collapse 07/27/2021   Generalized abdominal pain 05/19/2021   Syncope 04/03/2021   Celiac disease 04/03/2021   Iron deficiency anemia 02/03/2021   Hypothyroidism 08/05/2014   Anxiety and depression 08/05/2014   Eczema 08/05/2014    Current Outpatient Medications  Medication Sig Dispense Refill   citalopram (CELEXA) 40 MG tablet Take 1 tablet (40 mg total) by mouth daily. 90 tablet 1   dicyclomine (BENTYL) 10 MG capsule TAKE 1 CAPSULE (10 MG TOTAL) BY MOUTH EVERY 6 (SIX) HOURS AS NEEDED FOR SPASMS. 90 capsule 0   meloxicam (MOBIC) 15 MG tablet Take 1 tablet (15 mg total) by mouth daily. 30 tablet 0   Multiple Vitamins-Minerals (MULTIVITAMIN WITH MINERALS) tablet Take 1 tablet by mouth daily.     pantoprazole (PROTONIX) 20 MG tablet Take 20 mg by mouth daily.     SYNTHROID 175 MCG tablet TAKE 1 TABLET BY MOUTH EVERY DAY BEFORE BREAKFAST 30 tablet 0   triamcinolone ointment (KENALOG) 0.1 % Apply 1 application topically 2 (two) times daily as needed (eczema).      D3 50 MCG (2000 UT) TABS TAKE 1 TABLET BY MOUTH EVERY DAY (Patient taking differently: Take 1 tablet by mouth daily.) 30 tablet 1   No current facility-administered medications for this visit.    Allergies: Gluten meal  Past Medical History:  Diagnosis Date   Anemia    during pregnancy   Anxiety    Celiac disease    Depression    Eczema    Gallstones    History of ovarian cyst    Hx of varicella    Hypothyroidism    PONV (postoperative nausea and vomiting)     Past Surgical History:  Procedure Laterality Date   CESAREAN SECTION N/A 02/18/2016   Procedure: CESAREAN SECTION;  Surgeon: Harold Hedge, MD;  Location: [facility];  Service: Obstetrics;  Laterality: N/A;   CESAREAN  SECTION WITH BILATERAL TUBAL LIGATION N/A 01/10/2019   Procedure: CESAREAN SECTION WITH BILATERAL TUBAL LIGATION;  Surgeon: Harold Hedge, MD;  Location: [facility] LD ORS;  Service: Obstetrics;  Laterality: N/A;  Repeat edc 4/29 NKDA Tracey RNFA   CHOLECYSTECTOMY N/A 08/22/2020   Procedure: LAPAROSCOPIC CHOLECYSTECTOMY;  Surgeon: Quentin Ore, MD;  Location: [facility] OR;  Service: General;  Laterality: N/A;   WISDOM TOOTH EXTRACTION     in high school per patient    Family History  Problem Relation Age of Onset   Depression Mother    Migraines Mother    Hypothyroidism Mother    Dementia Father    Diabetes Father    Depression Father    Hypothyroidism Father    Alcohol abuse Maternal Grandmother    Alcohol abuse Maternal Grandfather    Hypothyroidism Paternal Grandmother    Heart disease Paternal Grandfather    Diabetes Paternal Grandfather    Skin cancer Paternal Grandfather    Depression Sister    ADD / ADHD Sister    Irritable bowel syndrome Sister    Anxiety disorder Sister    Panic disorder Brother    Hypothyroidism Brother    Anxiety disorder Brother     Social History   Tobacco Use   Smoking status: Never   Smokeless tobacco: Never  Substance Use Topics   Alcohol  use: Yes    Comment: 1 cocktail or wine per month none since pregnancy    Subjective:   Right ankle pain x 1 week; does not remember injuring her ankle; feels better with supportive shoes. No NSAIDs- prefers not to take daily medication;     Objective:  Vitals:   04/01/22 1501  BP: 100/65  Pulse: 83  Resp: 16  Temp: 98 F (36.7 C)  TempSrc: Oral  SpO2: 99%  Weight: 222 lb (100.7 kg)  Height: 5\' 6"  (1.676 m)    General: Well developed, well nourished, in no acute distress  Skin : Warm and dry.  Head: Normocephalic and atraumatic  Lungs: Respirations unlabored;  Musculoskeletal: No deformities; no active joint inflammation  Neurologic: Alert and oriented; speech intact; face symmetrical; moves  all extremities well; CNII-XII intact without focal deficit   Assessment:  1. Acute right ankle pain     Plan:  Suspect strain; do not feel X-ray is necessary at that time; encouraged to try and ice, elevate, wear supportive shoes; trial of Mobic 15 mg daily; follow up worse, no better.   No follow-ups on file.  No orders of the defined types were placed in this encounter.   Requested Prescriptions   Signed Prescriptions Disp Refills   meloxicam (MOBIC) 15 MG tablet 30 tablet 0    Sig: Take 1 tablet (15 mg total) by mouth daily.

## 2022-04-01 NOTE — Patient Instructions (Signed)
Ankle Sprain  An ankle sprain is a stretch or tear in one of the tough tissues (ligaments) that connect the bones in your ankle. An ankle sprain can happen when the ankle rolls outward (inversion sprain) or inward (eversion sprain). What are the causes? This condition is caused by rolling or twisting the ankle. What increases the risk? You are more likely to develop this condition if you play sports. What are the signs or symptoms? Symptoms of this condition include: Pain in your ankle. Swelling. Bruising. This may happen right after you sprain your ankle or 1-2 days later. Trouble standing or walking. How is this diagnosed? This condition is diagnosed with: A physical exam. During the exam, your doctor will press on certain parts of your foot and ankle and try to move them in certain ways. X-ray imaging. These may be taken to see how bad the sprain is and to check for broken bones. How is this treated? This condition may be treated with: A brace or splint. This is used to keep the ankle from moving until it heals. An elastic bandage. This is used to support the ankle. Crutches. Pain medicine. Surgery. This may be needed if the sprain is very bad. Physical therapy. This may help to improve movement in the ankle. Follow these instructions at home: If you have a brace or a splint: Wear the brace or splint as told by your doctor. Remove it only as told by your doctor. Loosen the brace or splint if your toes: Tingle. Lose feeling (become numb). Turn cold and blue. Keep the brace or splint clean. If the brace or splint is not waterproof: Do not let it get wet. Cover it with a watertight covering when you take a bath or a shower. If you have an elastic bandage (dressing): Remove it to shower or bathe. Try not to move your ankle much, but wiggle your toes from time to time. This helps to prevent swelling. Adjust the dressing if it feels too tight. Loosen the dressing if your  foot: Loses feeling. Tingles. Becomes cold and blue. Managing pain, stiffness, and swelling  Take over-the-counter and prescription medicines only as told by doctor. For 2-3 days, keep your ankle raised (elevated) above the level of your heart. If told, put ice on the injured area: If you have a removable brace or splint, remove it as told by your doctor. Put ice in a plastic bag. Place a towel between your skin and the bag. Leave the ice on for 20 minutes, 2-3 times a day. General instructions Rest your ankle. Do not use your injured leg to support your body weight until your doctor says that you can. Use crutches as told by your doctor. Do not use any products that contain nicotine or tobacco, such as cigarettes, e-cigarettes, and chewing tobacco. If you need help quitting, ask your doctor. Keep all follow-up visits as told by your doctor. Contact a doctor if: Your bruises or swelling are quickly getting worse. Your pain does not get better after you take medicine. Get help right away if: You cannot feel your toes or foot. Your foot or toes look blue. You have very bad pain that gets worse. Summary An ankle sprain is a stretch or tear in one of the tough tissues (ligaments) that connect the bones in your ankle. This condition is caused by rolling or twisting the ankle. Symptoms include pain, swelling, bruising, and trouble walking. To help with pain and swelling, put ice on the injured   ankle, raise your ankle above the level of your heart, and use an elastic bandage. Also, rest as told by your doctor. Keep all follow-up visits as told by your doctor. This is important. This information is not intended to replace advice given to you by your health care provider. Make sure you discuss any questions you have with your health care provider. Document Revised: 10/30/2020 Document Reviewed: 10/30/2020 Elsevier Patient Education  2023 Elsevier Inc.  

## 2022-04-10 ENCOUNTER — Other Ambulatory Visit: Payer: Self-pay | Admitting: Family

## 2022-04-29 ENCOUNTER — Other Ambulatory Visit: Payer: Self-pay | Admitting: Family

## 2022-05-11 ENCOUNTER — Encounter: Payer: Medicaid Other | Admitting: Family

## 2022-05-28 ENCOUNTER — Ambulatory Visit: Payer: Medicaid Other | Admitting: Cardiology

## 2022-06-01 ENCOUNTER — Other Ambulatory Visit: Payer: Self-pay | Admitting: Family

## 2022-06-08 ENCOUNTER — Telehealth: Payer: Self-pay

## 2022-06-08 ENCOUNTER — Other Ambulatory Visit: Payer: Self-pay

## 2022-06-08 DIAGNOSIS — E559 Vitamin D deficiency, unspecified: Secondary | ICD-10-CM

## 2022-06-08 NOTE — Telephone Encounter (Signed)
-----   Message from Marice Potter, RN sent at 12/03/2021 10:33 AM EDT ----- From mychart message- check vitamin D level in 6 months. Need to place order.

## 2022-06-08 NOTE — Telephone Encounter (Signed)
Left message for pt to call back  °

## 2022-06-11 NOTE — Telephone Encounter (Signed)
Left detailed message on pt's voice mail

## 2022-06-18 ENCOUNTER — Ambulatory Visit (INDEPENDENT_AMBULATORY_CARE_PROVIDER_SITE_OTHER): Payer: Medicaid Other | Admitting: Family

## 2022-06-18 ENCOUNTER — Encounter: Payer: Self-pay | Admitting: Family

## 2022-06-18 ENCOUNTER — Encounter: Payer: Medicaid Other | Admitting: Family

## 2022-06-18 VITALS — BP 106/63 | HR 71 | Temp 98.6°F | Resp 16 | Ht 66.5 in | Wt 222.0 lb

## 2022-06-18 DIAGNOSIS — Z23 Encounter for immunization: Secondary | ICD-10-CM | POA: Diagnosis not present

## 2022-06-18 DIAGNOSIS — E559 Vitamin D deficiency, unspecified: Secondary | ICD-10-CM | POA: Diagnosis not present

## 2022-06-18 DIAGNOSIS — Z0000 Encounter for general adult medical examination without abnormal findings: Secondary | ICD-10-CM

## 2022-06-18 NOTE — Progress Notes (Signed)
Subjective:   By signing my name below, I, [name], attest that this documentation has been prepared under the direction and in the presence of [name], NP 06/18/2022     Patient ID: Jessica Villa, female    DOB: 1981/02/25, 41 y.o.   MRN: [id number]  Chief Complaint  Patient presents with   Annual Exam         HPI Patient is in today for a comprehensive physical exam.  Fatigue: She reports fatigue but does not believe that the fatigue is due to her thyroid levels Lab Results  Component Value Date   TSH 1.24 03/10/2022   Stress: She reports familial stress. Her stress is improving due to moving.  Cough: She reports a cough a month ago and it lingered but is now resolved  Hip Pain: Patient reports of occasional hip pains. She states that she wakes up with occasionally symptoms however, by the next day symptoms are resolved  Vitamin D: She's requesting to have her vitamin D levels checked  Travel: She is moving to [facility] and therefore is planning to transition care.   Eczema: Her symptoms are persistent. She states that her gluten free diet does not alleviate symptoms  Moles: She reports of new moles on her left forearm.   Celiac: She is maintaining a gluten-free diet.   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 Pap Smear: Last completed 04/23/2019. She is overdue for a pap smear and is not interested in receiving one due to being on her cycle at the moment Mammogram: Last completed 03/26/2022 Immunizations: interested  in influenza vaccine this visit. She is UTD on tetanus Diet: Reports she needs to make healthier options Exercise: Reports she needs to exercise more often Dental: Reports she is UTD on dental Vision: Reports she is UTD on vision  Health Maintenance Due  Topic Date Due   PAP SMEAR-Modifier  04/22/2022    Past Medical History:  Diagnosis Date   Anemia    during pregnancy   Anxiety    Celiac disease    Depression    Eczema    Gallstones    History of ovarian cyst    Hx of varicella    Hypothyroidism    PONV (postoperative nausea and vomiting)     Past Surgical History:  Procedure Laterality Date   CESAREAN SECTION N/A 02/18/2016   Procedure: CESAREAN SECTION;  Surgeon: Everlene Farrier, MD;  Location: [facility];  Service: Obstetrics;  Laterality: N/A;   CESAREAN SECTION WITH BILATERAL TUBAL LIGATION N/A 01/10/2019   Procedure: CESAREAN SECTION WITH BILATERAL TUBAL LIGATION;  Surgeon: Everlene Farrier, MD;  Location: [facility] LD ORS;  Service: Obstetrics;  Laterality: N/A;  Repeat edc 4/29 NKDA Tracey RNFA   CHOLECYSTECTOMY N/A 08/22/2020   Procedure: LAPAROSCOPIC CHOLECYSTECTOMY;  Surgeon: Felicie Morn, MD;  Location: [facility] OR;  Service: General;  Laterality: N/A;   WISDOM TOOTH EXTRACTION     in high school per patient    Family History  Problem Relation Age of Onset   Depression Mother    Migraines Mother  Hypothyroidism Mother    Dementia Father    Diabetes Father    Depression Father    Hypothyroidism Father    Alcohol abuse Maternal Grandmother    Alcohol abuse Maternal Grandfather    Hypothyroidism Paternal Grandmother    Heart disease Paternal Grandfather    Diabetes Paternal Grandfather    Skin cancer Paternal Grandfather    Depression Sister    ADD / ADHD Sister    Irritable bowel syndrome Sister    Anxiety disorder Sister    Panic disorder Brother    Hypothyroidism Brother    Anxiety disorder Brother     Social History   Socioeconomic  History   Marital status: Married    Spouse name: Not on file   Number of children: 2   Years of education: Not on file   Highest education level: Not on file  Occupational History   Occupation: stay at home mom  Tobacco Use   Smoking status: Never   Smokeless tobacco: Never  Vaping Use   Vaping Use: Never used  Substance and Sexual Activity   Alcohol use: Yes    Comment: 1 cocktail or wine per month none since pregnancy   Drug use: No   Sexual activity: Yes    Partners: Male  Other Topics Concern   Not on file  Social History Narrative   2 step children ( 6year and 43 year old) lives with pt and her husband.    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   Has masters degree in education leadership   Has worked as a Education officer, museum, crocheting, no pets      Social Determinants of Health   Financial Resource Strain: [place]  (12/28/2018)   Overall Financial Resource Strain ([place])    Difficulty of Paying Living Expenses: Not hard at [place]: No [place] (12/28/2018)   Hunger Vital Sign    Worried About Running Out of Food in the Last Year: Never true    [place] in the Last Year: Never true  Transportation Needs: Unknown (12/28/2018)   PRAPARE - Hydrologist (Medical): No    Lack of Transportation (Non-Medical): Not on file  Physical Activity: Not on file  Stress: No Stress Concern Present (12/28/2018)   [place]    Feeling of Stress : Only a little  Social Connections: Not on file  Intimate Partner Violence: Not At Risk (12/28/2018)   Humiliation, Afraid, Rape, and Kick questionnaire    Fear of Current or Ex-Partner: No    Emotionally Abused: No    Physically Abused: No    Sexually Abused: No    Outpatient Medications Prior to Visit  Medication Sig Dispense Refill   citalopram (CELEXA) 40 MG tablet Take 1 tablet  (40 mg total) by mouth daily. 90 tablet 1   dicyclomine (BENTYL) 10 MG capsule TAKE 1 CAPSULE (10 MG TOTAL) BY MOUTH EVERY 6 (SIX) HOURS AS NEEDED FOR SPASMS. 90 capsule 0   Multiple Vitamins-Minerals (MULTIVITAMIN WITH MINERALS) tablet Take 1 tablet by mouth daily.     pantoprazole (PROTONIX) 20 MG tablet Take 20 mg by mouth daily.     SYNTHROID 175 MCG tablet Take 1 tablet (175 mcg total) by mouth daily before breakfast. 30 tablet 0   triamcinolone ointment (KENALOG) 0.1 % Apply 1 application  topically 2 (two) times daily as needed (eczema).      meloxicam (MOBIC) 15 MG tablet Take 1 tablet (15 mg total) by mouth daily. 30 tablet 0   No facility-administered medications prior to visit.    Allergies  Allergen Reactions   Gluten Meal     Celiac    Review of Systems  Constitutional:  Negative for fever.  HENT:  Negative for congestion, sinus pain and sore throat.   Respiratory:  Negative for cough, shortness of breath and wheezing.   Cardiovascular:  Negative for palpitations.  Gastrointestinal:  Negative for blood in stool, diarrhea, nausea and vomiting.  Genitourinary:  Negative for dysuria, frequency and hematuria.  Musculoskeletal:  Negative for joint pain and myalgias.  Skin:  Negative for rash.       (+) New Moles (Left Forearm)  Neurological:  Negative for headaches.  Psychiatric/Behavioral:  Negative for depression. The patient is not nervous/anxious.        Objective:    Physical Exam Constitutional:      General: She is not in acute distress.    Appearance: Normal appearance. She is not ill-appearing.  HENT:     Head: Normocephalic and atraumatic.     Right Ear: Tympanic membrane, ear canal and external ear normal.     Left Ear: Tympanic membrane, ear canal and external ear normal.  Eyes:     Extraocular Movements: Extraocular movements intact.     Pupils: Pupils are equal, round, and reactive to light.  Neck:     Thyroid: No thyromegaly.  Cardiovascular:      Rate and Rhythm: Normal rate and regular rhythm.     Heart sounds: Normal heart sounds. No murmur heard.    No gallop.  Pulmonary:     Effort: Pulmonary effort is normal. No respiratory distress.     Breath sounds: Normal breath sounds. No wheezing or rales.  Abdominal:     General: Bowel sounds are normal. There is no distension.     Palpations: Abdomen is soft.     Tenderness: There is no abdominal tenderness. There is no guarding.  Musculoskeletal:     Comments:  5/5 strength upper and lower extremeties  Lymphadenopathy:     Cervical: No cervical adenopathy.  Skin:    General: Skin is warm and dry.  Neurological:     Mental Status: She is alert and oriented to person, place, and time.     Deep Tendon Reflexes:     Reflex Scores:      Patellar reflexes are 2+ on the right side and 2+ on the left side. Psychiatric:        Mood and Affect: Mood normal.        Behavior: Behavior normal.        Judgment: Judgment normal.     BP 106/63 (BP Location: Right Arm, Patient Position: Sitting, Cuff Size: Large)   Pulse 71   Temp 98.6 F (37 C) (Oral)   Resp 16   Ht 5' 6.5" (1.689 m)   Wt 222 lb (100.7 kg)   SpO2 100%   BMI 35.29 kg/m  Wt Readings from Last 3 Encounters:  06/18/22 222 lb (100.7 kg)  04/01/22 222 lb (100.7 kg)  03/10/22 223 lb (101.2 kg)       Assessment & Plan:   Problem List Items Addressed This Visit       Unprioritized   Preventative health care - Primary    Discussed healthy diet, exercise, weight  loss.  Wt Readings from Last 3 Encounters:  06/18/22 222 lb (100.7 kg)  04/01/22 222 lb (100.7 kg)  03/10/22 223 lb (101.2 kg)  Pap is due but she just started her menstrual cycle today. Flu shot today. Recommended covid booster at the pharmacy. She will return for PAP, then she will be establishing with a provider in [place] where she recently moved for her husband's job.       Other Visit Diagnoses     Needs flu shot       Relevant Orders   Flu  Vaccine QUAD 6+ mos PF IM (Fluarix Quad PF) (Completed)   Vitamin D deficiency       Relevant Orders   VITAMIN D 25 Hydroxy (Vit-D Deficiency, Fractures)      No orders of the defined types were placed in this encounter.   I, Nance Pear, NP, personally preformed the services described in this documentation.  All medical record entries made by the scribe were at my direction and in my presence.  I have reviewed the chart and discharge instructions (if applicable) and agree that the record reflects my personal performance and is accurate and complete. 06/18/2022   I,Amber Collins,acting as a scribe for Nance Pear, NP.,have documented all relevant documentation on the behalf of Nance Pear, NP,as directed by  Nance Pear, NP while in the presence of Nance Pear, NP.      Nance Pear, NP

## 2022-06-18 NOTE — Assessment & Plan Note (Signed)
Discussed healthy diet, exercise, weight loss.  Wt Readings from Last 3 Encounters:  06/18/22 222 lb (100.7 kg)  04/01/22 222 lb (100.7 kg)  03/10/22 223 lb (101.2 kg)   Pap is due but she just started her menstrual cycle today. Flu shot today. Recommended covid booster at the pharmacy. She will return for PAP, then she will be establishing with a provider in [place] where she recently moved for her husband's job.

## 2022-06-19 LAB — VITAMIN D 25 HYDROXY (VIT D DEFICIENCY, FRACTURES): Vit D, 25-Hydroxy: 33 ng/mL (ref 30–100)

## 2022-06-21 ENCOUNTER — Encounter: Payer: Self-pay | Admitting: Family

## 2022-06-25 ENCOUNTER — Ambulatory Visit: Payer: Medicaid Other | Admitting: Family

## 2022-07-07 ENCOUNTER — Other Ambulatory Visit: Payer: Self-pay | Admitting: Family

## 2022-07-13 ENCOUNTER — Encounter: Payer: Self-pay | Admitting: Family

## 2022-07-14 ENCOUNTER — Other Ambulatory Visit: Payer: Self-pay | Admitting: Family

## 2022-07-14 MED ORDER — ONDANSETRON HCL 4 MG PO TABS: 4.0000 mg | ORAL_TABLET | Freq: Three times a day (TID) | ORAL | 0 refills | Status: DC | PRN

## 2022-08-10 ENCOUNTER — Encounter: Payer: Self-pay | Admitting: Family

## 2022-08-10 ENCOUNTER — Telehealth (INDEPENDENT_AMBULATORY_CARE_PROVIDER_SITE_OTHER): Payer: Medicaid Other | Admitting: Family

## 2022-08-10 VITALS — Ht 66.0 in | Wt 215.0 lb

## 2022-08-10 DIAGNOSIS — R110 Nausea: Secondary | ICD-10-CM | POA: Diagnosis not present

## 2022-08-10 DIAGNOSIS — A084 Viral intestinal infection, unspecified: Secondary | ICD-10-CM | POA: Diagnosis not present

## 2022-08-10 NOTE — Progress Notes (Signed)
Jessica Villa is a 41 y.o. female with the following history as recorded in EpicCare:  Patient Active Problem List   Diagnosis Date Noted   GERD (gastroesophageal reflux disease) 03/10/2022   Ventricular ectopy 11/27/2021   Syncope and collapse 07/27/2021   Generalized abdominal pain 05/19/2021   Syncope 04/03/2021   Celiac disease 04/03/2021   Iron deficiency anemia 02/03/2021   Preventative health care 10/22/2014   Hypothyroidism 08/05/2014   Anxiety and depression 08/05/2014   Eczema 08/05/2014    Current Outpatient Medications  Medication Sig Dispense Refill   citalopram (CELEXA) 40 MG tablet Take 1 tablet (40 mg total) by mouth daily. 90 tablet 1   dicyclomine (BENTYL) 10 MG capsule TAKE 1 CAPSULE (10 MG TOTAL) BY MOUTH EVERY 6 (SIX) HOURS AS NEEDED FOR SPASMS. 90 capsule 0   Multiple Vitamins-Minerals (MULTIVITAMIN WITH MINERALS) tablet Take 1 tablet by mouth daily.     ondansetron (ZOFRAN) 4 MG tablet Take 1 tablet (4 mg total) by mouth every 8 (eight) hours as needed for nausea or vomiting. 20 tablet 0   pantoprazole (PROTONIX) 20 MG tablet Take 20 mg by mouth daily.     SYNTHROID 175 MCG tablet Take 1 tablet (175 mcg total) by mouth daily before breakfast. 90 tablet 0   triamcinolone ointment (KENALOG) 0.1 % Apply 1 application topically 2 (two) times daily as needed (eczema).      No current facility-administered medications for this visit.    Allergies: Gluten meal  Past Medical History:  Diagnosis Date   Anemia    during pregnancy   Anxiety    Celiac disease    Depression    Eczema    Gallstones    History of ovarian cyst    Hx of varicella    Hypothyroidism    PONV (postoperative nausea and vomiting)     Past Surgical History:  Procedure Laterality Date   CESAREAN SECTION N/A 02/18/2016   Procedure: CESAREAN SECTION;  Surgeon: Everlene Farrier, MD;  Location: [facility];  Service: Obstetrics;  Laterality: N/A;   CESAREAN SECTION WITH BILATERAL TUBAL  LIGATION N/A 01/10/2019   Procedure: CESAREAN SECTION WITH BILATERAL TUBAL LIGATION;  Surgeon: Everlene Farrier, MD;  Location: [facility];  Service: Obstetrics;  Laterality: N/A;  Repeat edc 4/29 NKDA Tracey RNFA   CHOLECYSTECTOMY N/A 08/22/2020   Procedure: LAPAROSCOPIC CHOLECYSTECTOMY;  Surgeon: Felicie Morn, MD;  Location: [facility];  Service: General;  Laterality: N/A;   WISDOM TOOTH EXTRACTION     in high school per patient    Family History  Problem Relation Age of Onset   Depression Mother    Migraines Mother    Hypothyroidism Mother    Dementia Father    Diabetes Father    Depression Father    Hypothyroidism Father    Alcohol abuse Maternal Grandmother    Alcohol abuse Maternal Grandfather    Hypothyroidism Paternal Grandmother    Heart disease Paternal Grandfather    Diabetes Paternal Grandfather    Skin cancer Paternal Grandfather    Depression Sister    ADD / ADHD Sister    Irritable bowel syndrome Sister    Anxiety disorder Sister    Panic disorder Brother    Hypothyroidism Brother    Anxiety disorder Brother     Social History   Tobacco Use   Smoking status: Never   Smokeless tobacco: Never  Substance Use Topics   Alcohol use: Yes    Comment: 1 cocktail or wine  per month none since pregnancy    Subjective:   I connected with [name] on 08/10/22 at  3:00 PM EST by a video enabled telemedicine application and verified that I am speaking with the correct person using two identifiers.   I discussed the limitations of evaluation and management by telemedicine and the availability of in person appointments. The patient expressed understanding and agreed to proceed. Provider in office/ patient is at home; provider and patient are only 2 people on video call.   Notes she has been struggling nausea for the past month; however, on Friday, had "stomach bug" with nausea/ vomiting/ diarrhea; is continuing to feel weak; notes that her children are sick as well;  has been using her Zofran with mild benefit; Patient has moved to [place], [place] and has yet to establish with new PCP; planning for that appointment in January 2024;      Objective:  Vitals:   08/10/22 1508  Weight: 215 lb (97.5 kg)  Height: 5' 6"  (1.676 m)    General: Well developed, well nourished, in no acute distress  Skin : Warm and dry.  Head: Normocephalic and atraumatic  Lungs: Respirations unlabored;  Neurologic: Alert and oriented; speech intact; face symmetrical;   Assessment:  1. Viral gastroenteritis   2. Nausea     Plan:  Patient is not in the local area- has moved to [place], [place]; stressed to her that do feel she needs to be seen by someone in person today and she agrees; she will go to local [facility] close to her home today;   No follow-ups on file.  No orders of the defined types were placed in this encounter.   Requested Prescriptions    No prescriptions requested or ordered in this encounter

## 2022-08-17 ENCOUNTER — Other Ambulatory Visit: Payer: Self-pay | Admitting: Family

## 2022-08-17 ENCOUNTER — Telehealth: Payer: Medicaid Other | Admitting: Family

## 2022-08-17 MED ORDER — ONDANSETRON HCL 4 MG PO TABS: 4.0000 mg | ORAL_TABLET | Freq: Three times a day (TID) | ORAL | 0 refills | Status: DC | PRN

## 2022-08-25 ENCOUNTER — Other Ambulatory Visit: Payer: Medicaid Other

## 2022-08-25 DIAGNOSIS — K900 Celiac disease: Secondary | ICD-10-CM

## 2022-08-26 LAB — TISSUE TRANSGLUTAMINASE ABS,IGG,IGA
(tTG) Ab, IgA: <1 U/mL
(tTG) Ab, IgG: <1 U/mL

## 2022-09-06 DIAGNOSIS — K900 Celiac disease: Secondary | ICD-10-CM | POA: Diagnosis not present

## 2022-09-06 DIAGNOSIS — R1013 Epigastric pain: Secondary | ICD-10-CM | POA: Diagnosis not present

## 2022-09-07 DIAGNOSIS — Z79890 Hormone replacement therapy (postmenopausal): Secondary | ICD-10-CM | POA: Diagnosis not present

## 2022-09-07 DIAGNOSIS — E079 Disorder of thyroid, unspecified: Secondary | ICD-10-CM | POA: Diagnosis not present

## 2022-09-07 DIAGNOSIS — Z9049 Acquired absence of other specified parts of digestive tract: Secondary | ICD-10-CM | POA: Diagnosis not present

## 2022-09-07 DIAGNOSIS — Z91018 Allergy to other foods: Secondary | ICD-10-CM | POA: Diagnosis not present

## 2022-09-07 DIAGNOSIS — R1013 Epigastric pain: Secondary | ICD-10-CM | POA: Diagnosis not present

## 2022-09-07 DIAGNOSIS — R1012 Left upper quadrant pain: Secondary | ICD-10-CM | POA: Diagnosis not present

## 2022-09-07 DIAGNOSIS — K219 Gastro-esophageal reflux disease without esophagitis: Secondary | ICD-10-CM | POA: Diagnosis not present

## 2022-09-07 DIAGNOSIS — Z79899 Other long term (current) drug therapy: Secondary | ICD-10-CM | POA: Diagnosis not present

## 2022-09-10 ENCOUNTER — Telehealth: Payer: Self-pay | Admitting: Obstetrics and Gynecology

## 2022-09-10 NOTE — Patient Outreach (Signed)
Transition Care Management Follow-up Telephone Call Date of discharge and from where: 09/07/22 from [facility], [place] How have you been since you were released from the hospital? Some better with meds Any questions or concerns? No  Items Reviewed: Did the pt receive and understand the discharge instructions provided? Yes  Medications obtained and verified? Yes  Other? No  Any new allergies since your discharge? No  Dietary orders reviewed? No Do you have support at home? Yes   Home Care and Equipment/Supplies: Were home health services ordered? no If so, what is the name of the agency? N/A  Has the agency set up a time to come to the patient's home? not applicable Were any new equipment or medical supplies ordered?  No What is the name of the medical supply agency? N/A Were you able to get the supplies/equipment? not applicable Do you have any questions related to the use of the equipment or supplies? No  Functional Questionnaire: (I = Independent and D = Dependent) ADLs: I  Bathing/Dressing- I  Meal Prep- I  Eating- I  Maintaining continence- I  Transferring/Ambulation- I  Managing Meds- I  Follow up appointments reviewed:  PCP Hospital f/u appt confirmed? No  , not scheduled with PCP-will follow up if needed-has specialist appt. [facility] f/u appt confirmed? Yes  Scheduled to see Amy Esterwood at [facility] on 09/24/22 at 10 Are transportation arrangements needed? No  If their condition worsens, is the pt aware to call PCP or go to the Emergency Dept.? Yes Was the patient provided with contact information for the PCP's office or ED? Yes Was to pt encouraged to call back with questions or concerns? Yes

## 2022-09-18 ENCOUNTER — Encounter: Payer: Self-pay | Admitting: Family

## 2022-09-24 ENCOUNTER — Encounter: Payer: Self-pay | Admitting: Physician Assistant

## 2022-09-24 ENCOUNTER — Ambulatory Visit: Payer: Medicaid Other | Admitting: Physician Assistant

## 2022-09-24 ENCOUNTER — Other Ambulatory Visit (INDEPENDENT_AMBULATORY_CARE_PROVIDER_SITE_OTHER): Payer: Medicaid Other

## 2022-09-24 VITALS — BP 120/68 | HR 78 | Ht 66.0 in | Wt 229.0 lb

## 2022-09-24 DIAGNOSIS — R110 Nausea: Secondary | ICD-10-CM | POA: Diagnosis not present

## 2022-09-24 DIAGNOSIS — K900 Celiac disease: Secondary | ICD-10-CM

## 2022-09-24 DIAGNOSIS — R1013 Epigastric pain: Secondary | ICD-10-CM

## 2022-09-24 DIAGNOSIS — R1012 Left upper quadrant pain: Secondary | ICD-10-CM | POA: Diagnosis not present

## 2022-09-24 LAB — BASIC METABOLIC PANEL
BUN: 16 mg/dL (ref 6–23)
CO2: 27 mEq/L (ref 19–32)
Calcium: 9.5 mg/dL (ref 8.4–10.5)
Chloride: 102 mEq/L (ref 96–112)
Creatinine, Ser: 0.69 mg/dL (ref 0.40–1.20)
GFR: 105.95 mL/min (ref >60.00)
Glucose, Bld: 92 mg/dL (ref 70–99)
Potassium: 3.7 mEq/L (ref 3.5–5.1)
Sodium: 136 mEq/L (ref 135–145)

## 2022-09-24 MED ORDER — PANTOPRAZOLE SODIUM 20 MG PO TBEC: 20.0000 mg | DELAYED_RELEASE_TABLET | Freq: Every day | ORAL | 3 refills | Status: AC

## 2022-09-24 MED ORDER — ONDANSETRON HCL 4 MG PO TABS
4.0000 mg | ORAL_TABLET | Freq: Four times a day (QID) | ORAL | 0 refills | Status: AC | PRN
Start: 2022-09-24 — End: ?

## 2022-09-24 NOTE — Patient Instructions (Addendum)
Your provider has requested that you go to the basement level for lab work before leaving today. Press "B" on the elevator. The lab is located at the first door on the left as you exit the elevator.  Due to recent changes in healthcare laws, you may see the results of your imaging and laboratory studies on MyChart before your provider has had a chance to review them.  We understand that in some cases there may be results that are confusing or concerning to you. Not all laboratory results come back in the same time frame and the provider may be waiting for multiple results in order to interpret others.  Please give [place] 48 hours in order for your provider to thoroughly review all the results before contacting the office for clarification of your results.   We have sent the following medications to your pharmacy for you to pick up at your convenience: Zofran and pantoprazole   You have been scheduled for a CT scan of the abdomen and pelvis at [facility], 1st floor Radiology. You are scheduled on 09/28/2022 at 4:30pm. You should arrive 2 hours prior to your appointment time for registration.   Please follow the written instructions below on the day of your exam:   1) Do not eat anything after 12:30pm (4 hours prior to your test)   The purpose of you drinking the oral contrast is to aid in the visualization of your intestinal tract. The contrast solution may cause some diarrhea. Depending on your individual set of symptoms, you may also receive an intravenous injection of x-ray contrast/dye.   If you have any questions regarding your exam or if you need to reschedule, you may call [facility] at [phone] between the hours of 8:00 am and 5:00 pm, Monday-Friday.   I appreciate the opportunity to care for you. Amy Esterwood, PA-C

## 2022-09-27 ENCOUNTER — Encounter: Payer: Self-pay | Admitting: Physician Assistant

## 2022-09-27 NOTE — Progress Notes (Signed)
Agree with the assessment and plan as outlined by Amy Esterwood, PA-C.  Matilde Pottenger, DO, FACG  

## 2022-09-27 NOTE — Progress Notes (Signed)
Subjective:    Patient ID: Jessica Villa, female    DOB: October 26, 1980, 42 y.o.   MRN: [id number]  HPI  Jessica Villa is a pleasant 42 year old white female, established with Dr. Barron Alvine, who comes in today with complaint of persistent nausea over the past 6 to 7 weeks. Patient has diagnosis of celiac disease made in June 2022 at which time she had undergone EGD with finding of a normal-appearing stomach, biopsies negative for H. pylori, the duodenum there was villous flattening with a scalloped appearance and biopsies were consistent with celiac disease.  She has been on a strict gluten-free diet, she did have labs done in December 2023 showing a TTG IgG of less than 1.0 and TTG IgA less than 1.0.  She says that she and her children all were ill in November And Her Symptoms Were That of a Gastroenteritis.  She had had some nausea prior to that time but then persistent nausea ever since. About 2 weeks ago she had a episode of severe pain located in the upper abdomen which she described as burning and pressure-like.  She went to the emergency room, did not have any imaging done but did have labs which were all unremarkable.  She had nausea with that episode but no vomiting, no diarrhea no fever or chills. She was given Zofran to use for nausea, Tylenol for pain and started on Carafate 4 times daily.  Also on Protonix 20 mg p.o. every morning.Marland Kitchen He says at this time overall she feels better she is able to eat, is not having any ongoing abdominal pain, no diarrhea.  She did become constipated with Tylenol and has since stopped that.  She is using Zofran as needed.  She is concerned because of the persistence of symptoms and has had some ongoing discomfort in the upper abdomen though not at all severe since that 1 episode 2 weeks ago.   Does have history of chronic GERD, history of iron deficiency anemia, anxiety/depression is status post cholecystectomy in 2021 and has had C-section.  Review of Systems  Pertinent positive and negative review of systems were noted in the above HPI section.  All other review of systems was otherwise negative.   Outpatient Encounter Medications as of 09/24/2022  Medication Sig   citalopram (CELEXA) 40 MG tablet Take 1 tablet (40 mg total) by mouth daily.   dicyclomine (BENTYL) 10 MG capsule TAKE 1 CAPSULE (10 MG TOTAL) BY MOUTH EVERY 6 (SIX) HOURS AS NEEDED FOR SPASMS.   Multiple Vitamins-Minerals (MULTIVITAMIN WITH MINERALS) tablet Take 1 tablet by mouth daily.   polyethylene glycol (MIRALAX) 17 g packet    sucralfate (CARAFATE) 1 g tablet Take by mouth.   SYNTHROID 175 MCG tablet Take 1 tablet (175 mcg total) by mouth daily before breakfast.   triamcinolone ointment (KENALOG) 0.1 % Apply 1 application topically 2 (two) times daily as needed (eczema).    [DISCONTINUED] ondansetron (ZOFRAN) 4 MG tablet Take 1 tablet (4 mg total) by mouth every 8 (eight) hours as needed for nausea or vomiting.   [DISCONTINUED] pantoprazole (PROTONIX) 20 MG tablet Take 20 mg by mouth daily.   ondansetron (ZOFRAN) 4 MG tablet Take 1 tablet (4 mg total) by mouth every 6 (six) hours as needed for nausea or vomiting.   pantoprazole (PROTONIX) 20 MG tablet Take 1 tablet (20 mg total) by mouth daily.   No facility-administered encounter medications on file as of 09/24/2022.   Allergies  Allergen Reactions   Gluten Meal  Celiac   Patient Active Problem List   Diagnosis Date Noted   GERD (gastroesophageal reflux disease) 03/10/2022   Ventricular ectopy 11/27/2021   Syncope and collapse 07/27/2021   Generalized abdominal pain 05/19/2021   Syncope 04/03/2021   Celiac disease 04/03/2021   Iron deficiency anemia 02/03/2021   Preventative health care 10/22/2014   Hypothyroidism 08/05/2014   Anxiety and depression 08/05/2014   Eczema 08/05/2014   Social History   Socioeconomic History   Marital status: Married    Spouse name: Not on file   Number of children: 2   Years of  education: Not on file   Highest education level: Not on file  Occupational History   Occupation: stay at home mom  Tobacco Use   Smoking status: Never   Smokeless tobacco: Never  Vaping Use   Vaping Use: Never used  Substance and Sexual Activity   Alcohol use: Yes    Comment: 1 cocktail or wine per month none since pregnancy   Drug use: No   Sexual activity: Yes    Partners: Male  Other Topics Concern   Not on file  Social History Narrative   2 step children ( 6year and 44 year old) lives with pt and her husband.    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   Has masters degree in education leadership   Has worked as a Education officer, museum, crocheting, no pets      Social Determinants of Health   Financial Resource Strain: [place]  (12/28/2018)   Overall Financial Resource Strain ([place])    Difficulty of Paying Living Expenses: Not hard at [place]: No [place] (12/28/2018)   Hunger Vital Sign    Worried About Running Out of Food in the Last Year: Never true    [place] in the Last Year: Never true  Transportation Needs: Unknown (12/28/2018)   PRAPARE - Hydrologist (Medical): No    Lack of Transportation (Non-Medical): Not on file  Physical Activity: Not on file  Stress: No Stress Concern Present (12/28/2018)   [place]    Feeling of Stress : Only a little  Social Connections: Not on file  Intimate Partner Violence: Not At Risk (12/28/2018)   Humiliation, Afraid, Rape, and Kick questionnaire    Fear of Current or Ex-Partner: No    Emotionally Abused: No    Physically Abused: No    Sexually Abused: No    Ms. Tuller's family history includes ADD / ADHD in her sister; Alcohol abuse in her maternal grandfather and maternal grandmother; Anxiety disorder in her brother and sister; Dementia in her father; Depression in her  father, mother, and sister; Diabetes in her father and paternal grandfather; Heart disease in her paternal grandfather; Hypothyroidism in her brother, father, mother, and paternal grandmother; Irritable bowel syndrome in her sister; Migraines in her mother; Panic disorder in her brother; Skin cancer in her paternal grandfather.      Objective:    Vitals:   09/24/22 1002  BP: 120/68  Pulse: 78  SpO2: 98%    Physical Exam Well-developed well-nourished WF  in no acute distress.  Height, Weight, 229 BMI 36.9 accompanied by her young daughter  HEENT; nontraumatic normocephalic, EOMI, PE R LA, sclera anicteric. Oropharynx;not examined Neck; supple, no JVD Cardiovascular; regular rate and rhythm with S1-S2,  no murmur rub or gallop Pulmonary; Clear bilaterally Abdomen; soft, there is some mild tenderness in the epigastrium and hypogastrium no guarding or rebound ,nondistended, no palpable mass or hepatosplenomegaly, bowel sounds are active Rectal; not done today Skin; benign exam, no jaundice rash or appreciable lesions Extremities; no clubbing cyanosis or edema skin warm and dry Neuro/Psych; alert and oriented x4, grossly nonfocal mood and affect appropriate        Assessment & Plan:   #21 42 year old white female with history of celiac disease which has been under good control, maintains a strict gluten-free diet and recent labs December 2023 with normal TTG IgG and TTG IgA  Patient comes in now with 6 to 7-week history of persistent nausea, and then an episode of severe upper abdominal pain which took her to the emergency room about 2 weeks ago, described as burning and pressure in nature associated with nausea but no vomiting, no associated fever chills diarrhea melena etc. Labs were unremarkable, she did not have any imaging done.  This was treated as gastritis versus ulcer disease and she was started on Carafate, Tylenol, Zofran and low-dose Protonix.  Symptoms have significantly  improved, she is has not had any recurrence of the severe pain but has had some mild upper abdominal discomfort and persistent low-level nausea though she is currently able to eat without difficulty.  She stopped all of the meds mentioned above over the past few days.  Etiology of symptoms is not entirely clear suspect gastritis, cannot rule out peptic ulcer disease, consider other small bowel inflammatory process in setting of known celiac disease. Pt is status post cholecystectomy  Plan; Restart Protonix 20 mg p.o. every morning AC breakfast Refill Zofran 4 mg every 6 hours as needed for nausea Continue gluten-free diet Will schedule for CT of the abdomen and pelvis with contrast.  If this is negative and symptoms are persisting she will likely need EGD with Dr. Barron Alvine.  Patient related that she and her family have recently relocated to [facility] and she is in the process of establishing GI care and primary care closer to home.  She would like to complete some of this GI evaluation here as she knows that there will be a delay in getting in with a new GI provider.  Copper Basnett Oswald Hillock PA-C 09/27/2022   Cc: Sandford Craze, NP

## 2022-09-28 ENCOUNTER — Ambulatory Visit (HOSPITAL_COMMUNITY): Payer: Medicaid Other

## 2022-10-01 ENCOUNTER — Other Ambulatory Visit: Payer: Self-pay | Admitting: Family

## 2022-10-02 ENCOUNTER — Other Ambulatory Visit: Payer: Self-pay | Admitting: Family

## 2022-10-04 ENCOUNTER — Ambulatory Visit (HOSPITAL_COMMUNITY)
Admission: RE | Admit: 2022-10-04 | Discharge: 2022-10-04 | Disposition: A | Discharge status: Home / Self Care | Payer: Medicaid Other | Source: Ambulatory Visit | Attending: Physician Assistant | Admitting: Physician Assistant

## 2022-10-04 DIAGNOSIS — R110 Nausea: Secondary | ICD-10-CM | POA: Insufficient documentation

## 2022-10-04 DIAGNOSIS — R1012 Left upper quadrant pain: Secondary | ICD-10-CM | POA: Diagnosis not present

## 2022-10-04 DIAGNOSIS — R1013 Epigastric pain: Secondary | ICD-10-CM | POA: Insufficient documentation

## 2022-10-04 DIAGNOSIS — K900 Celiac disease: Secondary | ICD-10-CM | POA: Diagnosis not present

## 2022-10-04 MED ORDER — IOHEXOL 300 MG/ML  SOLN
100.0000 mL | Freq: Once | INTRAMUSCULAR | Status: AC | PRN
  Administered 2022-10-04: 100 mL via INTRAVENOUS

## 2022-10-04 MED ORDER — IOHEXOL 9 MG/ML PO SOLN
ORAL | Status: AC
  Filled 2022-10-04: qty 1000

## 2022-10-14 DIAGNOSIS — E039 Hypothyroidism, unspecified: Secondary | ICD-10-CM | POA: Diagnosis not present

## 2022-10-14 DIAGNOSIS — L309 Dermatitis, unspecified: Secondary | ICD-10-CM | POA: Diagnosis not present

## 2022-10-14 DIAGNOSIS — R110 Nausea: Secondary | ICD-10-CM | POA: Diagnosis not present

## 2022-10-14 DIAGNOSIS — F321 Major depressive disorder, single episode, moderate: Secondary | ICD-10-CM | POA: Diagnosis not present

## 2022-10-14 DIAGNOSIS — Z23 Encounter for immunization: Secondary | ICD-10-CM | POA: Diagnosis not present

## 2022-10-14 DIAGNOSIS — K900 Celiac disease: Secondary | ICD-10-CM | POA: Diagnosis not present

## 2022-10-14 DIAGNOSIS — Z6836 Body mass index (BMI) 36.0-36.9, adult: Secondary | ICD-10-CM | POA: Diagnosis not present

## 2022-10-14 DIAGNOSIS — Z789 Other specified health status: Secondary | ICD-10-CM | POA: Diagnosis not present

## 2023-01-25 IMAGING — DX DG CHEST 2V
2 series · 2 of 2 positions shown · non-contrast
Comparison: 08/22/2017

CLINICAL DATA: Left upper back pain, pleuritic pain, mild dyspnea

EXAM:
CHEST - 2 VIEW

[chest pa]
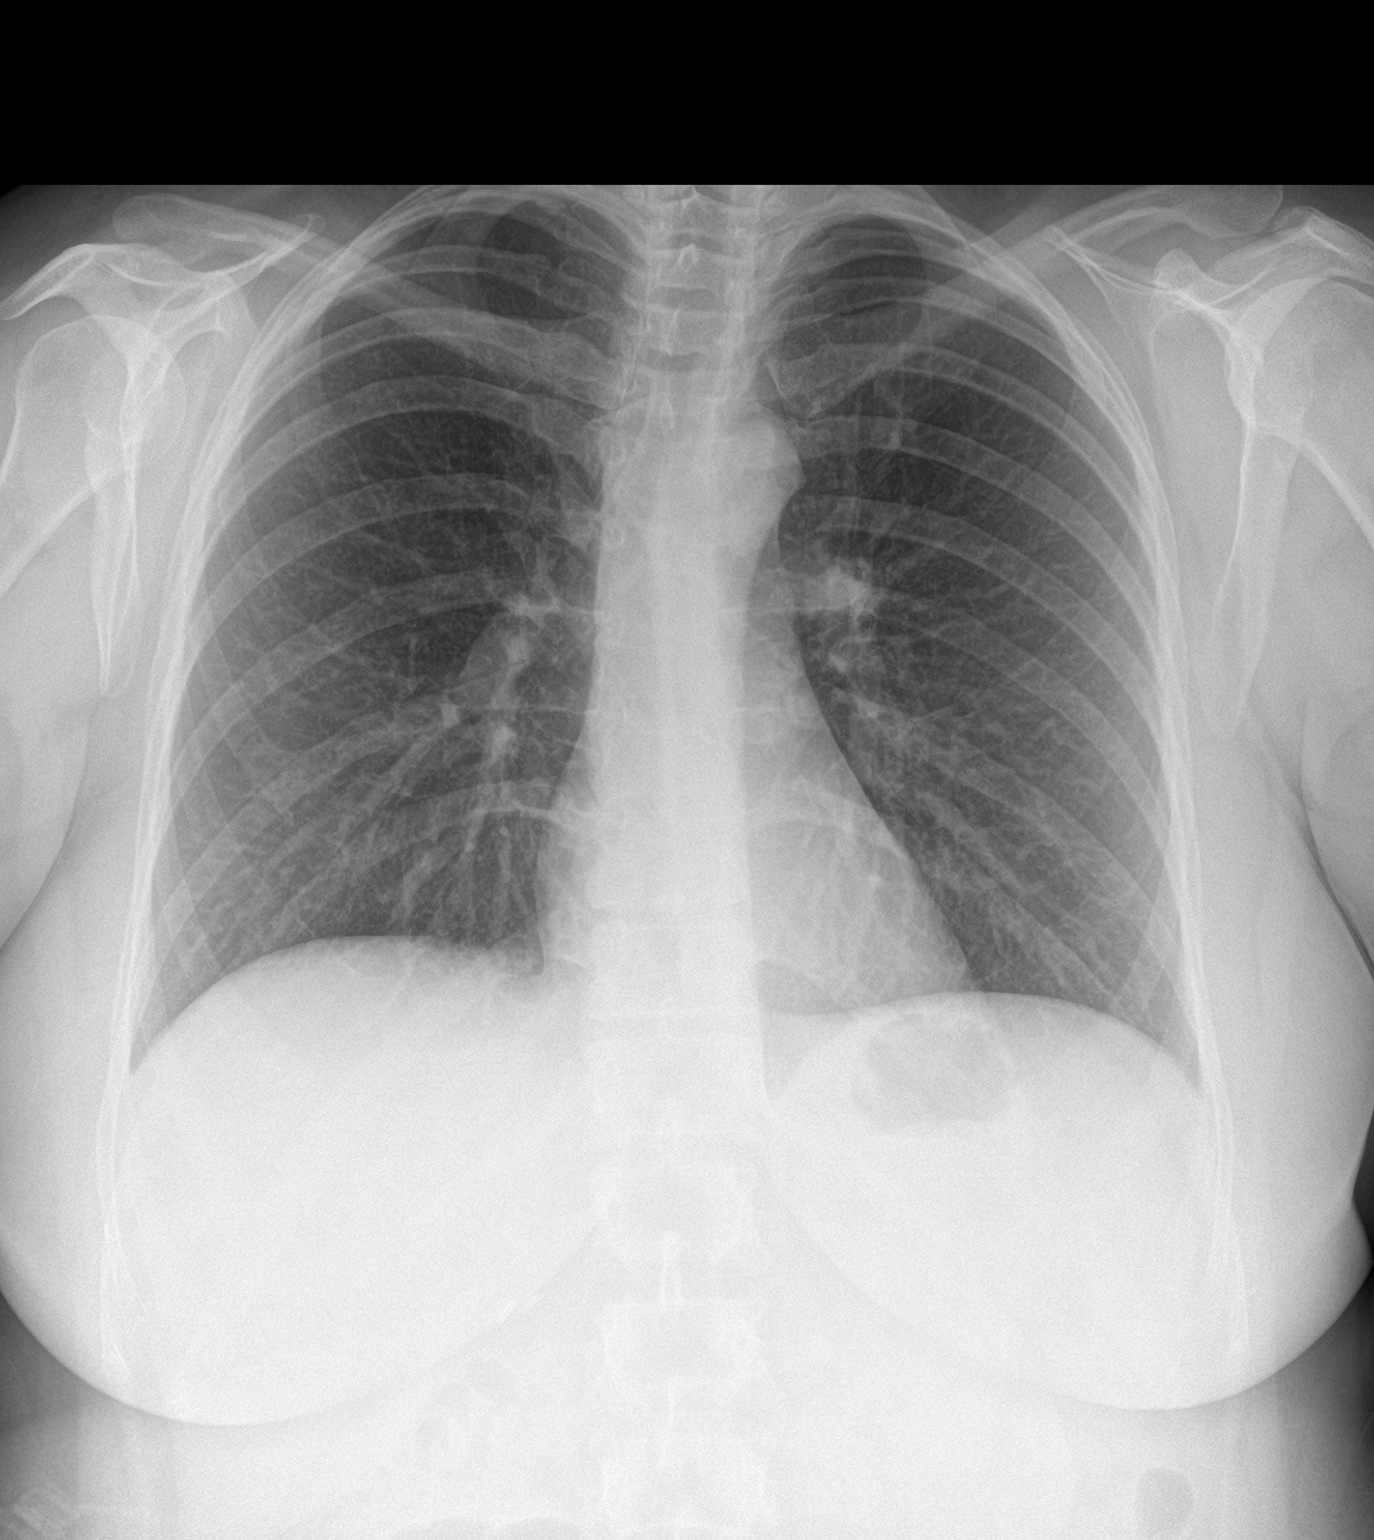

[chest lat]
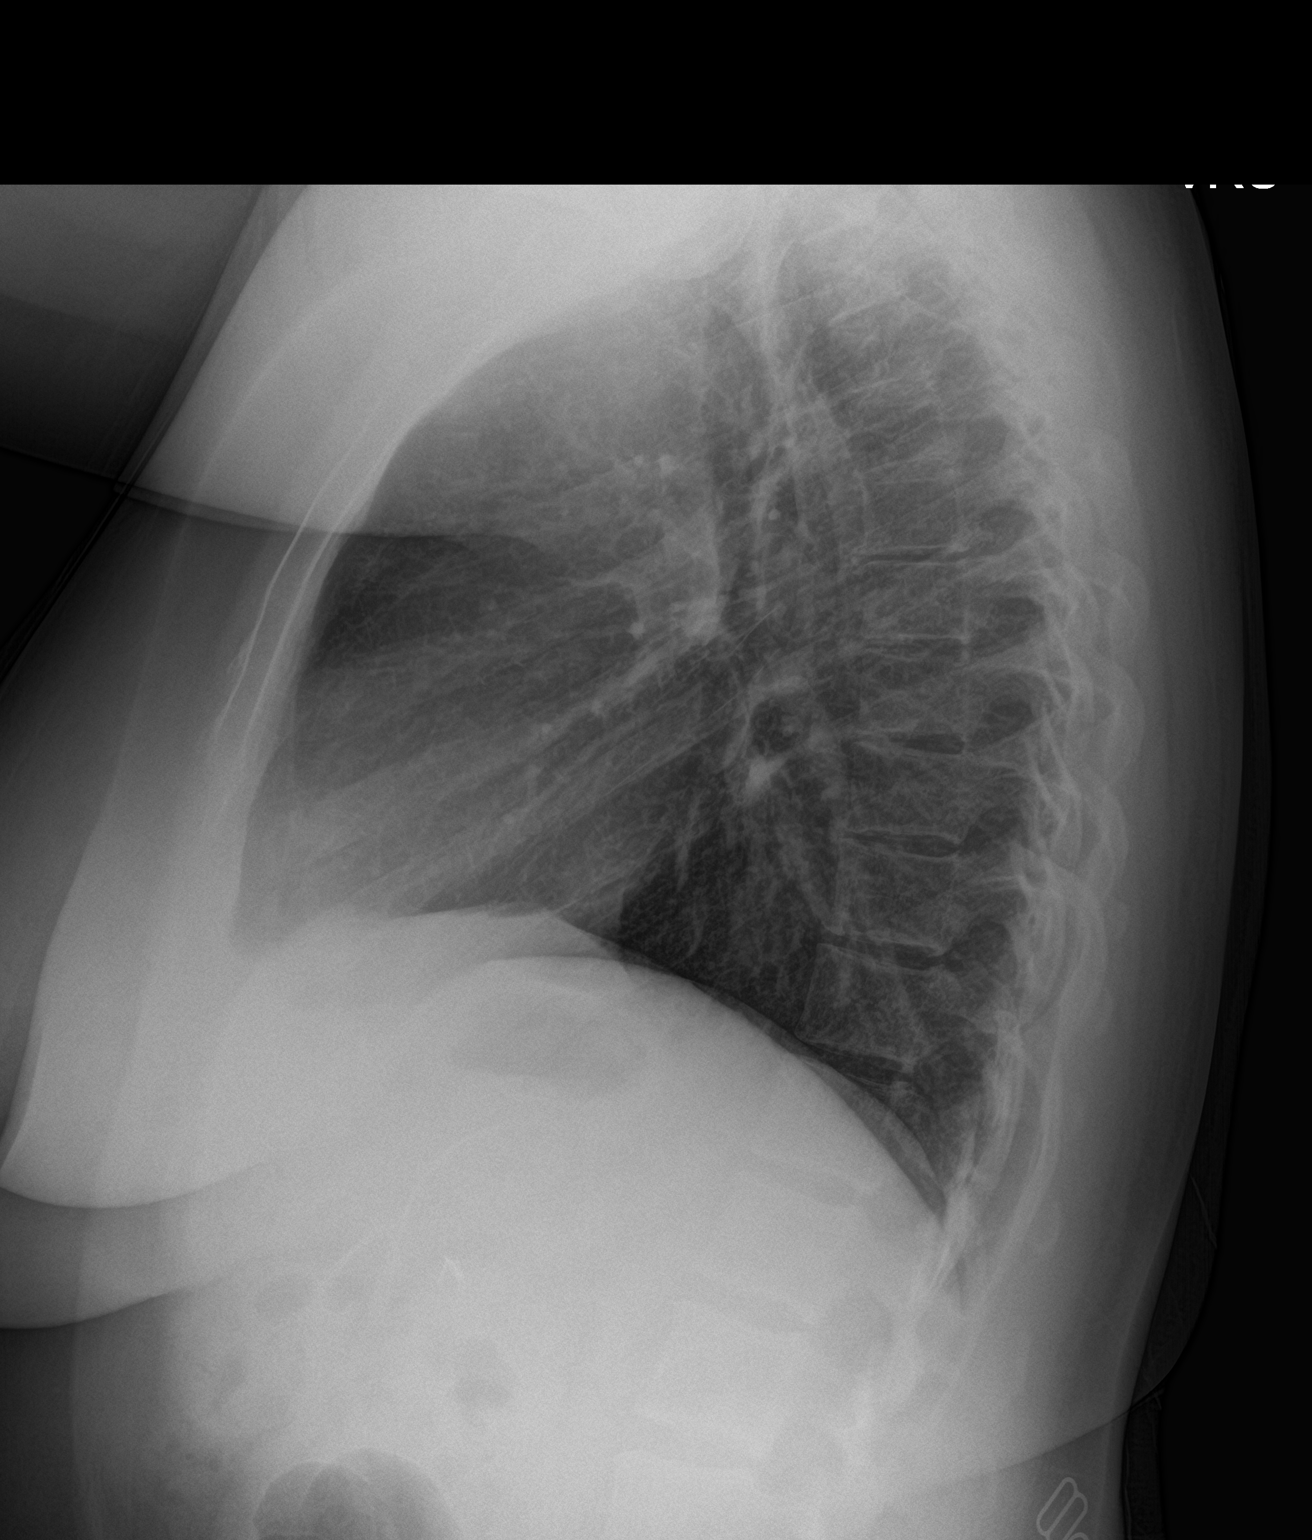

[2 of 2 positions shown; findings below may reference images not displayed]

FINDINGS: The heart size and mediastinal contours are within normal limits.
Both lungs are clear. The visualized skeletal structures are
unremarkable.
IMPRESSION: No active cardiopulmonary disease.

## 2023-03-18 ENCOUNTER — Other Ambulatory Visit: Payer: Self-pay | Admitting: Family

## 2023-03-28 ENCOUNTER — Other Ambulatory Visit: Payer: Self-pay | Admitting: Family

## 2024-01-05 ENCOUNTER — Other Ambulatory Visit: Payer: Self-pay | Admitting: Physician Assistant
